# Patient Record
Sex: Female | Born: 1985
Health system: Southern US, Community
[De-identification: ages and names within clinical notes are randomized; demographics above are authoritative.]

## PROBLEM LIST (undated history)

## (undated) ENCOUNTER — Inpatient Hospital Stay (HOSPITAL_COMMUNITY): Payer: Self-pay

## (undated) DIAGNOSIS — R87610 Atypical squamous cells of undetermined significance on cytologic smear of cervix (ASC-US): Secondary | ICD-10-CM

## (undated) DIAGNOSIS — A64 Unspecified sexually transmitted disease: Secondary | ICD-10-CM

## (undated) DIAGNOSIS — E229 Hyperfunction of pituitary gland, unspecified: Secondary | ICD-10-CM

## (undated) DIAGNOSIS — D249 Benign neoplasm of unspecified breast: Secondary | ICD-10-CM

## (undated) DIAGNOSIS — B019 Varicella without complication: Secondary | ICD-10-CM

## (undated) DIAGNOSIS — IMO0002 Reserved for concepts with insufficient information to code with codable children: Secondary | ICD-10-CM

## (undated) DIAGNOSIS — K589 Irritable bowel syndrome without diarrhea: Secondary | ICD-10-CM

## (undated) DIAGNOSIS — R7989 Other specified abnormal findings of blood chemistry: Secondary | ICD-10-CM

## (undated) DIAGNOSIS — Z22322 Carrier or suspected carrier of Methicillin resistant Staphylococcus aureus: Secondary | ICD-10-CM

## (undated) HISTORY — DX: Atypical squamous cells of undetermined significance on cytologic smear of cervix (ASC-US): R87.610

## (undated) HISTORY — PX: INDUCED ABORTION: SHX677

## (undated) HISTORY — PX: ADENOIDECTOMY: SHX5191

## (undated) HISTORY — DX: Unspecified sexually transmitted disease: A64

## (undated) HISTORY — DX: Benign neoplasm of unspecified breast: D24.9

## (undated) HISTORY — DX: Carrier or suspected carrier of methicillin resistant Staphylococcus aureus: Z22.322

## (undated) HISTORY — PX: AUGMENTATION MAMMAPLASTY: SUR837

## (undated) HISTORY — PX: DILATION AND CURETTAGE OF UTERUS: SHX78

## (undated) HISTORY — DX: Hyperfunction of pituitary gland, unspecified: E22.9

## (undated) HISTORY — DX: Other specified abnormal findings of blood chemistry: R79.89

## (undated) HISTORY — DX: Reserved for concepts with insufficient information to code with codable children: IMO0002

## (undated) HISTORY — DX: Irritable bowel syndrome, unspecified: K58.9

## (undated) HISTORY — DX: Varicella without complication: B01.9

## (undated) HISTORY — PX: COLPOSCOPY: SHX161

---
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
## 2007-05-06 ENCOUNTER — Emergency Department (HOSPITAL_COMMUNITY): Admission: EM | Admit: 2007-05-06 | Discharge: 2007-05-06 | Payer: Self-pay | Admitting: Family Medicine

## 2007-05-20 ENCOUNTER — Emergency Department (HOSPITAL_COMMUNITY): Admission: EM | Admit: 2007-05-20 | Discharge: 2007-05-20 | Payer: Self-pay | Admitting: Emergency Medicine

## 2007-05-30 ENCOUNTER — Other Ambulatory Visit: Admission: RE | Admit: 2007-05-30 | Discharge: 2007-05-30 | Payer: Self-pay | Admitting: Obstetrics and Gynecology

## 2007-07-03 ENCOUNTER — Inpatient Hospital Stay (HOSPITAL_COMMUNITY): Admission: AD | Admit: 2007-07-03 | Discharge: 2007-07-03 | Payer: Self-pay | Admitting: Obstetrics & Gynecology

## 2008-03-27 ENCOUNTER — Emergency Department (HOSPITAL_COMMUNITY): Admission: EM | Admit: 2008-03-27 | Discharge: 2008-03-27 | Payer: Self-pay | Admitting: Family Medicine

## 2008-03-31 ENCOUNTER — Ambulatory Visit: Payer: Self-pay | Admitting: Family Medicine

## 2008-04-03 ENCOUNTER — Ambulatory Visit: Payer: Self-pay | Admitting: Family Medicine

## 2008-04-07 ENCOUNTER — Ambulatory Visit: Payer: Self-pay | Admitting: Family Medicine

## 2008-04-08 ENCOUNTER — Encounter (INDEPENDENT_AMBULATORY_CARE_PROVIDER_SITE_OTHER): Payer: Self-pay | Admitting: Internal Medicine

## 2008-04-08 ENCOUNTER — Telehealth (INDEPENDENT_AMBULATORY_CARE_PROVIDER_SITE_OTHER): Payer: Self-pay | Admitting: Internal Medicine

## 2008-04-10 ENCOUNTER — Telehealth (INDEPENDENT_AMBULATORY_CARE_PROVIDER_SITE_OTHER): Payer: Self-pay | Admitting: *Deleted

## 2008-04-23 ENCOUNTER — Encounter (INDEPENDENT_AMBULATORY_CARE_PROVIDER_SITE_OTHER): Payer: Self-pay | Admitting: Internal Medicine

## 2008-05-22 ENCOUNTER — Emergency Department (HOSPITAL_COMMUNITY): Admission: EM | Admit: 2008-05-22 | Discharge: 2008-05-22 | Payer: Self-pay | Admitting: Family Medicine

## 2008-10-08 ENCOUNTER — Emergency Department (HOSPITAL_COMMUNITY): Admission: EM | Admit: 2008-10-08 | Discharge: 2008-10-08 | Payer: Self-pay | Admitting: Family Medicine

## 2008-10-14 ENCOUNTER — Ambulatory Visit: Payer: Self-pay | Admitting: Family Medicine

## 2008-10-14 DIAGNOSIS — R1084 Generalized abdominal pain: Secondary | ICD-10-CM | POA: Insufficient documentation

## 2008-10-14 LAB — CONVERTED CEMR LAB
Bacteria, UA: 0
Epithelial cells, urine: 0 /lpf
Urobilinogen, UA: 0.2
WBC Urine, dipstick: NEGATIVE

## 2008-11-27 ENCOUNTER — Encounter (INDEPENDENT_AMBULATORY_CARE_PROVIDER_SITE_OTHER): Payer: Self-pay | Admitting: Internal Medicine

## 2008-11-27 ENCOUNTER — Other Ambulatory Visit: Admission: RE | Admit: 2008-11-27 | Discharge: 2008-11-27 | Payer: Self-pay | Admitting: Family Medicine

## 2008-11-27 ENCOUNTER — Ambulatory Visit: Payer: Self-pay | Admitting: Family Medicine

## 2008-11-30 LAB — CONVERTED CEMR LAB
AST: 16 units/L (ref 0–37)
Albumin: 4 g/dL (ref 3.5–5.2)
BUN: 15 mg/dL (ref 6–23)
Basophils Absolute: 0 10*3/uL (ref 0.0–0.1)
CO2: 30 meq/L (ref 19–32)
Calcium: 8.7 mg/dL (ref 8.4–10.5)
Cholesterol: 156 mg/dL (ref 0–200)
Eosinophils Absolute: 0.1 10*3/uL (ref 0.0–0.7)
GFR calc non Af Amer: 116.05 mL/min (ref >60)
Glucose, Bld: 86 mg/dL (ref 70–99)
HCT: 40 % (ref 36.0–46.0)
HDL: 47.1 mg/dL (ref >39.00)
Lymphs Abs: 2.4 10*3/uL (ref 0.7–4.0)
MCHC: 33.8 g/dL (ref 30.0–36.0)
Monocytes Relative: 6.2 % (ref 3.0–12.0)
Neutro Abs: 3.8 10*3/uL (ref 1.4–7.7)
Platelets: 319 10*3/uL (ref 150.0–400.0)
Potassium: 4 meq/L (ref 3.5–5.1)
RDW: 13.4 % (ref 11.5–14.6)
TSH: 0.5 microintl units/mL (ref 0.35–5.50)
Total Bilirubin: 0.5 mg/dL (ref 0.3–1.2)
VLDL: 7.6 mg/dL (ref 0.0–40.0)

## 2008-12-02 ENCOUNTER — Encounter: Payer: Self-pay | Admitting: Family Medicine

## 2008-12-03 ENCOUNTER — Telehealth (INDEPENDENT_AMBULATORY_CARE_PROVIDER_SITE_OTHER): Payer: Self-pay | Admitting: Internal Medicine

## 2009-01-13 ENCOUNTER — Other Ambulatory Visit: Admission: RE | Admit: 2009-01-13 | Discharge: 2009-01-13 | Payer: Self-pay | Admitting: Gynecology

## 2009-01-13 ENCOUNTER — Ambulatory Visit: Payer: Self-pay | Admitting: Gynecology

## 2009-01-13 ENCOUNTER — Encounter: Payer: Self-pay | Admitting: Gynecology

## 2009-01-18 ENCOUNTER — Ambulatory Visit: Payer: Self-pay | Admitting: Gynecology

## 2009-01-19 ENCOUNTER — Encounter: Admission: RE | Admit: 2009-01-19 | Discharge: 2009-01-19 | Payer: Self-pay | Admitting: Gynecology

## 2009-01-25 ENCOUNTER — Ambulatory Visit: Payer: Self-pay | Admitting: Gynecology

## 2009-04-13 ENCOUNTER — Ambulatory Visit: Payer: Self-pay | Admitting: Family Medicine

## 2009-04-13 ENCOUNTER — Encounter (INDEPENDENT_AMBULATORY_CARE_PROVIDER_SITE_OTHER): Payer: Self-pay | Admitting: Internal Medicine

## 2009-04-15 ENCOUNTER — Ambulatory Visit: Payer: Self-pay | Admitting: Family Medicine

## 2009-04-15 ENCOUNTER — Encounter (INDEPENDENT_AMBULATORY_CARE_PROVIDER_SITE_OTHER): Payer: Self-pay | Admitting: Internal Medicine

## 2009-04-15 LAB — CONVERTED CEMR LAB
Chlamydia, DNA Probe: POSITIVE — AB
GC Probe Amp, Genital: NEGATIVE

## 2009-06-30 ENCOUNTER — Other Ambulatory Visit: Admission: RE | Admit: 2009-06-30 | Discharge: 2009-06-30 | Payer: Self-pay | Admitting: Gynecology

## 2009-06-30 ENCOUNTER — Ambulatory Visit: Payer: Self-pay | Admitting: Gynecology

## 2009-07-23 ENCOUNTER — Emergency Department (HOSPITAL_COMMUNITY): Admission: EM | Admit: 2009-07-23 | Discharge: 2009-07-23 | Payer: Self-pay | Admitting: Family Medicine

## 2009-10-06 ENCOUNTER — Encounter: Admission: RE | Admit: 2009-10-06 | Discharge: 2009-10-06 | Payer: Self-pay | Admitting: Gynecology

## 2009-11-10 ENCOUNTER — Ambulatory Visit: Payer: Self-pay | Admitting: Gynecology

## 2009-11-15 ENCOUNTER — Ambulatory Visit: Payer: Self-pay | Admitting: Gynecology

## 2009-12-31 ENCOUNTER — Ambulatory Visit: Payer: Self-pay | Admitting: Gynecology

## 2010-02-02 ENCOUNTER — Other Ambulatory Visit: Admission: RE | Admit: 2010-02-02 | Discharge: 2010-02-02 | Payer: Self-pay | Admitting: Gynecology

## 2010-02-02 ENCOUNTER — Ambulatory Visit: Payer: Self-pay | Admitting: Gynecology

## 2010-02-24 ENCOUNTER — Emergency Department (HOSPITAL_COMMUNITY): Admission: EM | Admit: 2010-02-24 | Discharge: 2010-02-24 | Payer: Self-pay | Admitting: Family Medicine

## 2010-04-27 ENCOUNTER — Ambulatory Visit: Admit: 2010-04-27 | Payer: Self-pay | Admitting: Gynecology

## 2010-05-15 ENCOUNTER — Encounter: Payer: Self-pay | Admitting: Gynecology

## 2010-05-15 ENCOUNTER — Encounter: Payer: Self-pay | Admitting: Obstetrics & Gynecology

## 2010-05-20 ENCOUNTER — Ambulatory Visit
Admission: RE | Admit: 2010-05-20 | Discharge: 2010-05-20 | Payer: Self-pay | Source: Home / Self Care | Attending: Gynecology | Admitting: Gynecology

## 2010-05-22 ENCOUNTER — Emergency Department (HOSPITAL_COMMUNITY)
Admission: EM | Admit: 2010-05-22 | Discharge: 2010-05-22 | Payer: Self-pay | Source: Home / Self Care | Admitting: Emergency Medicine

## 2010-05-22 LAB — POCT PREGNANCY, URINE

## 2010-05-24 ENCOUNTER — Ambulatory Visit
Admission: RE | Admit: 2010-05-24 | Discharge: 2010-05-24 | Payer: Self-pay | Source: Home / Self Care | Attending: Gynecology | Admitting: Gynecology

## 2010-05-31 ENCOUNTER — Telehealth: Payer: Self-pay | Admitting: Family Medicine

## 2010-05-31 ENCOUNTER — Inpatient Hospital Stay (HOSPITAL_COMMUNITY)
Admission: AD | Admit: 2010-05-31 | Discharge: 2010-06-01 | Disposition: A | Discharge status: Home / Self Care | Payer: 59 | Source: Ambulatory Visit | Attending: Obstetrics and Gynecology | Admitting: Obstetrics and Gynecology

## 2010-05-31 DIAGNOSIS — E860 Dehydration: Secondary | ICD-10-CM | POA: Insufficient documentation

## 2010-05-31 DIAGNOSIS — N390 Urinary tract infection, site not specified: Secondary | ICD-10-CM

## 2010-05-31 DIAGNOSIS — O211 Hyperemesis gravidarum with metabolic disturbance: Secondary | ICD-10-CM

## 2010-05-31 DIAGNOSIS — O2390 Unspecified genitourinary tract infection in pregnancy, unspecified trimester: Secondary | ICD-10-CM | POA: Insufficient documentation

## 2010-05-31 LAB — URINALYSIS, ROUTINE W REFLEX MICROSCOPIC
Bilirubin Urine: NEGATIVE
Hgb urine dipstick: NEGATIVE
Ketones, ur: 40 mg/dL — AB
Nitrite: NEGATIVE
Protein, ur: NEGATIVE mg/dL
Specific Gravity, Urine: >1.03 — ABNORMAL HIGH (ref 1.005–1.030)
Urobilinogen, UA: 1 mg/dL (ref 0.0–1.0)

## 2010-05-31 LAB — URINE MICROSCOPIC-ADD ON

## 2010-05-31 LAB — CBC
HCT: 37.1 % (ref 36.0–46.0)
Hemoglobin: 12.2 g/dL (ref 12.0–15.0)
MCH: 27.5 pg (ref 26.0–34.0)
MCV: 83.7 fL (ref 78.0–100.0)
Platelets: 260 10*3/uL (ref 150–400)
RBC: 4.43 MIL/uL (ref 3.87–5.11)
WBC: 8.4 10*3/uL (ref 4.0–10.5)

## 2010-05-31 LAB — COMPREHENSIVE METABOLIC PANEL
ALT: 10 U/L (ref 0–35)
AST: 14 U/L (ref 0–37)
CO2: 26 mEq/L (ref 19–32)
Calcium: 9.2 mg/dL (ref 8.4–10.5)
Chloride: 101 mEq/L (ref 96–112)
Creatinine, Ser: 0.56 mg/dL (ref 0.4–1.2)
GFR calc non Af Amer: >60 mL/min (ref >60)
Glucose, Bld: 79 mg/dL (ref 70–99)
Total Bilirubin: 0.7 mg/dL (ref 0.3–1.2)

## 2010-06-01 ENCOUNTER — Other Ambulatory Visit: Payer: Self-pay | Admitting: Family Medicine

## 2010-06-01 ENCOUNTER — Ambulatory Visit: Payer: 59 | Admitting: Gynecology

## 2010-06-01 DIAGNOSIS — O3680X0 Pregnancy with inconclusive fetal viability, not applicable or unspecified: Secondary | ICD-10-CM

## 2010-06-01 DIAGNOSIS — Z3480 Encounter for supervision of other normal pregnancy, unspecified trimester: Secondary | ICD-10-CM

## 2010-06-02 ENCOUNTER — Encounter (INDEPENDENT_AMBULATORY_CARE_PROVIDER_SITE_OTHER): Payer: Self-pay | Admitting: *Deleted

## 2010-06-02 DIAGNOSIS — Z3480 Encounter for supervision of other normal pregnancy, unspecified trimester: Secondary | ICD-10-CM

## 2010-06-02 DIAGNOSIS — O219 Vomiting of pregnancy, unspecified: Secondary | ICD-10-CM

## 2010-06-02 LAB — CONVERTED CEMR LAB
Antibody Screen: NEGATIVE
Eosinophils Absolute: 0.1 10*3/uL (ref 0.0–0.7)
Eosinophils Relative: 1 % (ref 0–5)
HCT: 41 % (ref 36.0–46.0)
HIV: NONREACTIVE
Hemoglobin: 12.5 g/dL (ref 12.0–15.0)
Herpes Simplex Vrs I&II-IgM Ab (EIA): 1.03
Hgb S Quant: 0 % (ref 0.0–0.0)
Lymphocytes Relative: 29 % (ref 12–46)
Lymphs Abs: 2.1 10*3/uL (ref 0.7–4.0)
MCV: 90.9 fL (ref 78.0–100.0)
Monocytes Absolute: 0.6 10*3/uL (ref 0.1–1.0)
Monocytes Relative: 8 % (ref 3–12)
Rh Type: POSITIVE
WBC: 7.4 10*3/uL (ref 4.0–10.5)

## 2010-06-02 LAB — URINE CULTURE

## 2010-06-06 ENCOUNTER — Ambulatory Visit (HOSPITAL_COMMUNITY): Payer: 59

## 2010-06-07 ENCOUNTER — Encounter (HOSPITAL_COMMUNITY): Payer: Self-pay

## 2010-06-07 ENCOUNTER — Ambulatory Visit (HOSPITAL_COMMUNITY)
Admission: RE | Admit: 2010-06-07 | Discharge: 2010-06-07 | Disposition: A | Discharge status: Home / Self Care | Payer: 59 | Source: Ambulatory Visit | Attending: Family Medicine | Admitting: Family Medicine

## 2010-06-07 ENCOUNTER — Other Ambulatory Visit: Payer: Self-pay | Admitting: Family Medicine

## 2010-06-07 DIAGNOSIS — O3680X0 Pregnancy with inconclusive fetal viability, not applicable or unspecified: Secondary | ICD-10-CM

## 2010-06-07 DIAGNOSIS — O368390 Maternal care for abnormalities of the fetal heart rate or rhythm, unspecified trimester, not applicable or unspecified: Secondary | ICD-10-CM | POA: Insufficient documentation

## 2010-06-07 DIAGNOSIS — Z3689 Encounter for other specified antenatal screening: Secondary | ICD-10-CM | POA: Insufficient documentation

## 2010-06-08 ENCOUNTER — Ambulatory Visit (INDEPENDENT_AMBULATORY_CARE_PROVIDER_SITE_OTHER): Payer: 59 | Admitting: Gynecology

## 2010-06-08 ENCOUNTER — Other Ambulatory Visit: Payer: Self-pay | Admitting: Obstetrics and Gynecology

## 2010-06-08 ENCOUNTER — Ambulatory Visit (HOSPITAL_COMMUNITY)
Admission: RE | Admit: 2010-06-08 | Discharge: 2010-06-08 | Disposition: A | Discharge status: Home / Self Care | Payer: 59 | Source: Ambulatory Visit | Attending: Obstetrics & Gynecology | Admitting: Obstetrics & Gynecology

## 2010-06-08 ENCOUNTER — Ambulatory Visit: Payer: 59 | Admitting: Obstetrics & Gynecology

## 2010-06-08 ENCOUNTER — Other Ambulatory Visit: Payer: 59

## 2010-06-08 DIAGNOSIS — O021 Missed abortion: Secondary | ICD-10-CM

## 2010-06-08 LAB — CBC
HCT: 38.8 % (ref 36.0–46.0)
Hemoglobin: 12.4 g/dL (ref 12.0–15.0)
MCV: 84.5 fL (ref 78.0–100.0)
WBC: 8.6 10*3/uL (ref 4.0–10.5)

## 2010-06-08 LAB — SURGICAL PCR SCREEN

## 2010-06-09 NOTE — Progress Notes (Signed)
Summary: morning sickness  Phone Note Call from Patient Call back at Home Phone [phone]   Caller: Patient Call For: Dr. Milinda Antis Summary of Call: Patient says that she is [redacted] weeks pregnant and she is having alot of morning sickness. She says  that she does not have a gynecologist yet and she is really concerned for her health and her baby because she has lost 18lbs in a months time because she can't keep down any food, can't take her prenatal vit because they make her really sick as well. She is asking for any suggestions.  Initial call taken by: Melody Comas,  May 31, 2010 1:50 PM  Follow-up for Phone Call        we need to get her ref to obgyn asap - I will do referral  she will need to tell Shirlee Limerick her LMP the weight loss is no doubtedly frightening her  eating small amounts in frequent intervals is recommended  sips of fluids to stay hydrated  the book - WHAT TO EXPECT WHEN YOU ARE EXPECTING is also helpful  if weak or dizzy go to ER for fluids   Follow-up by: Judith Part MD,  May 31, 2010 4:48 PM  Additional Follow-up for Phone Call Additional follow up Details #1::        Left message for patient to call back. Lewanda Rife LPN  May 31, 2010 5:21 PM   New Problems: HYPEREMESIS GRAVIDARUM (ICD-643.10)   Additional Follow-up for Phone Call Additional follow up Details #2::    Appt made with [facility] today to see Tina-R.N. to get est for OB care. Follow-up by: Carlton Adam,  June 01, 2010 11:05 AM  New Problems: HYPEREMESIS GRAVIDARUM (ICD-643.10)

## 2010-06-14 ENCOUNTER — Encounter: Payer: 59 | Admitting: Obstetrics and Gynecology

## 2010-06-16 ENCOUNTER — Inpatient Hospital Stay (INDEPENDENT_AMBULATORY_CARE_PROVIDER_SITE_OTHER)
Admission: RE | Admit: 2010-06-16 | Discharge: 2010-06-16 | Disposition: A | Discharge status: Home / Self Care | Payer: No Typology Code available for payment source | Source: Ambulatory Visit | Attending: Emergency Medicine | Admitting: Emergency Medicine

## 2010-06-16 DIAGNOSIS — S139XXA Sprain of joints and ligaments of unspecified parts of neck, initial encounter: Secondary | ICD-10-CM

## 2010-06-16 DIAGNOSIS — T148XXA Other injury of unspecified body region, initial encounter: Secondary | ICD-10-CM

## 2010-06-20 ENCOUNTER — Encounter: Payer: 59 | Admitting: Obstetrics & Gynecology

## 2010-06-26 NOTE — Op Note (Signed)
  [name], [name]           ACCOUNT NO.:  [id number]  MEDICAL RECORD NO.:  [id number]           PATIENT TYPE:  O  LOCATION:  [facility]                          [facility]  PHYSICIAN:  Catalina Antigua, MD     DATE OF BIRTH:  [birth year]  DATE OF PROCEDURE:  06/08/2010 DATE OF DISCHARGE:  06/08/2010                              OPERATIVE REPORT   PREOPERATIVE DIAGNOSIS:  This is a 25 year old G4, P 1-0-2-1 with an 8- week missed abortion.  POSTOPERATIVE DIAGNOSIS:  This is a 25 year old G4, P 1-0-2-1 with an 8- week missed abortion.  PROCEDURE:  Dilatation and evacuation.  SURGEON:  Catalina Antigua, MD  ASSISTANT:  None.  ANESTHESIA:  MAC.  IV FLUIDS:  500 mL.  ESTIMATED BLOOD LOSS:  Minimal.  COMPLICATIONS:  None.  FINDINGS:  Approximately 10 weeks size uterus, no palpable adnexal masses.  Uterine cavity sounded to 10 cm.  SPECIMENS COLLECTED:  Products of conception and sent to pathology.  After informed consent was obtained, the patient was taken to the operating room where anesthesia was induced and found to be adequate. The patient was placed in dorsal lithotomy position and prepped and draped in the usual sterile fashion.  A Graves speculum was placed in the vagina, anterior lip of the cervix was grasped with a single-tooth tenaculum.  The cervix was injected in its circumference with a 10 mL of 1% Nesacaine solution.  The uterine cavity was sounded to 10 cm.  The cervix was then dilated to 10 mm through serial insertion of Hegar dilators.  A 9-mm curved curette was introduced into the uterine cavity and suction was applied.  A size 1 Sims curette was then introduced into the uterine cavity and gentle curettage was performed until a gritty texture was obtained.  The 9-mm curved curette was then introduced as to aspirate any loosened debris. All instruments were removed from the patient's vagina.  Hemostasis was visualized at tenaculum site.  The patient  tolerated the procedure well. Sponge, lap, and needle count were correct x2.     Catalina Antigua, MD     PC/MEDQ  D:  06/08/2010  T:  06/09/2010  Job:  [id number]  Electronically Signed by Catalina Antigua  on 06/26/2010 09:59:52 AM

## 2010-06-27 ENCOUNTER — Ambulatory Visit: Payer: Self-pay | Admitting: Obstetrics & Gynecology

## 2010-07-04 ENCOUNTER — Ambulatory Visit: Payer: Self-pay | Admitting: Obstetrics & Gynecology

## 2010-07-05 LAB — POCT URINALYSIS DIPSTICK
Protein, ur: NEGATIVE mg/dL
Specific Gravity, Urine: 1.025 (ref 1.005–1.030)
pH: 6 (ref 5.0–8.0)

## 2010-07-05 LAB — POCT PREGNANCY, URINE

## 2010-07-12 ENCOUNTER — Ambulatory Visit: Payer: Self-pay | Admitting: Obstetrics and Gynecology

## 2010-07-13 ENCOUNTER — Encounter: Payer: No Typology Code available for payment source | Admitting: Obstetrics & Gynecology

## 2010-07-13 DIAGNOSIS — Z09 Encounter for follow-up examination after completed treatment for conditions other than malignant neoplasm: Secondary | ICD-10-CM

## 2010-07-14 ENCOUNTER — Encounter: Payer: Self-pay | Admitting: Family Medicine

## 2010-07-15 ENCOUNTER — Encounter: Payer: Self-pay | Admitting: Family Medicine

## 2010-07-15 ENCOUNTER — Ambulatory Visit (INDEPENDENT_AMBULATORY_CARE_PROVIDER_SITE_OTHER): Payer: 59 | Admitting: Family Medicine

## 2010-07-15 VITALS — BP 102/70 | HR 72 | Temp 98.6°F | Wt 138.0 lb

## 2010-07-15 DIAGNOSIS — S39012A Strain of muscle, fascia and tendon of lower back, initial encounter: Secondary | ICD-10-CM

## 2010-07-15 DIAGNOSIS — S335XXA Sprain of ligaments of lumbar spine, initial encounter: Secondary | ICD-10-CM

## 2010-07-15 MED ORDER — CYCLOBENZAPRINE HCL 10 MG PO TABS: 10.0000 mg | ORAL_TABLET | Freq: Two times a day (BID) | ORAL | Status: DC | PRN
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
## 2010-07-15 NOTE — Assessment & Plan Note (Signed)
Supportive care for now.  Red flags to return discussed. Discussed scheduled NSAID, MR for next week.  Stretching/strengthening exercises provided from [facility] patient advisor on lower back pain. If not improving, consider PT referral. rec massage for tense muscles.

## 2010-07-26 NOTE — Assessment & Plan Note (Signed)
[name], DEROCHER NO.:  [id number]  MEDICAL RECORD NO.:  [id number]           PATIENT TYPE:  LOCATION:  [facility]           FACILITY:  PHYSICIAN:  Tinnie Gens, MD        DATE OF BIRTH:  1985/09/03  DATE OF SERVICE:  07/13/2010                                 CLINIC NOTE  CHIEF COMPLAINT:  Post-partum  check.  HISTORY OF PRESENT ILLNESS:  The patient is a 25 year old gravida 4, para 1-0-3-0 who is status post missed AB at 8 weeks.  Approximately a month ago, she underwent a D and C for an 8-week loss.  She has been doing well.  Her LMP was June 15, 2010.  She has been having unprotected intercourse and is waiting for her cycle presently.  The patient has a history of abnormal Pap.  She had a colpo done by Dr. Audie Box within the last year and possibly is to follow with a 63-month Pap but has not had that done yet.  Otherwise, she reports her mood is improving and better since her miscarriage.  PHYSICAL EXAMINATION TODAY:  VITAL SIGNS:  As noted in the chart. GENERAL:  She is a well-developed, well-nourished female, in no acute distress. ABDOMEN:  Soft, nontender, nondistended. GENITOURINARY:  Normal external female genitalia.  BUS normal.  Vagina is pink and rugated.  Cervix is parous without lesion.  Uterus is small, anteverted, and without adnexal mass or tenderness.  IMPRESSION:  Status post spontaneous abortion, doing well.  PLAN: 1. Methods of birth control are discussed as well as waiting approximately 3     months to achieve pregnancy. 2. I will check her records and determine when she needs her next Pap     smear scheduled and bring her back in for this.          ______________________________ Tinnie Gens, MD    TP/MEDQ  D:  07/13/2010  T:  07/14/2010  Job:  [id number]

## 2010-08-01 LAB — POCT URINALYSIS DIP (DEVICE)
Bilirubin Urine: NEGATIVE
Ketones, ur: NEGATIVE mg/dL
Specific Gravity, Urine: 1.02 (ref 1.005–1.030)
pH: 7 (ref 5.0–8.0)

## 2010-08-08 LAB — CULTURE, ROUTINE-ABSCESS

## 2010-09-09 NOTE — Consult Note (Signed)
[name], [name]           ACCOUNT NO.:  [id number]   MEDICAL RECORD NO.:  [id number]          PATIENT TYPE:  MAT   LOCATION:  [facility]                          [facility]ILITY:  [facility]   PHYSICIAN:  Lenoard Aden, M.D.DATE OF BIRTH:  [birth year]   DATE OF CONSULTATION:  07/29/2005  DATE OF DISCHARGE:                                   CONSULTATION   CHIEF COMPLAINT:  Labor.   HISTORY OF PRESENT ILLNESS:  She is an 25 year old African-American female,  G1, P0, who presents at [redacted] weeks gestation with rule out labor.   PAST OBSTETRIC HISTORY:  Noncontributory.   ALLERGIES:  BANANAS and WALNUTS.   MEDICATIONS:  Prenatal vitamins.   FAMILY HISTORY:  Breast cancer and seizure disorder.   SOCIAL HISTORY:  She is a nonsmoker, nondrinker, denies domestic or physical  violence.   PREGNANCY COURSE:  Complicated.   PHYSICAL EXAMINATION:  GENERAL: Well-developed, well-nourished African-  American female in no acute distress.  HEENT: Normal.  LUNGS: Clear.  HEART: Regular rate and rhythm.  ABDOMEN: Soft, gravid, nontender.  PELVIC:  Cervix closed, 20%, vertex, -3 per RN.  EXTREMITIES:  Reveal no cords.  NEUROLOGIC:  Exam nonfocal.   IMPRESSION:  1.  A 38-week intrauterine pregnancy.  2.  Prodromal labor.   PLAN:  1.  Monitor x1 hours.  2.  Recheck.  3.  Discharge home if no cervical change noted.  4.  Ambien to be given upon discharge.      Lenoard Aden, M.D.  Electronically Signed     RJT/MEDQ  D:  07/29/2005  T:  07/29/2005  Job:  [id number]

## 2010-09-09 NOTE — Consult Note (Signed)
[name], [name]           ACCOUNT NO.:  [id number]   MEDICAL RECORD NO.:  [id number]          PATIENT TYPE:  MAT   LOCATION:  [facility]                          [facility]   PHYSICIAN:  Lenoard Aden, M.D.DATE OF BIRTH:  [birth year]   DATE OF CONSULTATION:  08/07/2005  DATE OF DISCHARGE:                                   CONSULTATION   CHIEF COMPLAINT:  Rule out labor.   She is a 25 year old African-American female G1, P0 at 37 and two-sevenths  weeks with contractions today.  She denies bleeding and leakage of fluid.  She has noncontributory obstetric history.  Allergies to BANANAS and  WALNUTS.  She is a nonsmoker, nondrinker, and denies domestic and physical  violence.   MEDICATIONS:  Include prenatal vitamins.   FAMILY HISTORY:  Breast cancer and seizure disorder.   PERTINENT LABORATORY DATA:  Blood type O positive, Rh antibody negative.  Rubella immune.  Hepatitis and HIV nonreactive.   Pregnancy course to date reportedly uncomplicated.   PHYSICAL EXAMINATION:  GENERAL:  She is a well-developed, well-nourished  African-American female in no acute distress.  HEENT:  Normal.  LUNGS:  Clear.  HEART:  Regular rate and rhythm.  ABDOMEN:  Soft, gravid, nontender.  PELVIC:  Cervix is 1 cm, 50% effaced, vertex at a -2.  EXTREMITIES:  Revealed no cords.  NEUROLOGIC:  Nonfocal.   NST is reactive.  The patient ambulated for 1 hour and cervical exam is  unchanged.   IMPRESSION:  Prodromal labor pattern, no evidence of cervical change.   PLAN:  Discharge home.  Labor warnings given, Ambien given.  Follow up in  the office within 2 days.      Lenoard Aden, M.D.  Electronically Signed     RJT/MEDQ  D:  08/07/2005  T:  08/07/2005  Job:  [id number]

## 2010-09-22 ENCOUNTER — Other Ambulatory Visit (HOSPITAL_COMMUNITY)
Admission: RE | Admit: 2010-09-22 | Discharge: 2010-09-22 | Disposition: A | Discharge status: Home / Self Care | Payer: 59 | Source: Ambulatory Visit | Attending: Gynecology | Admitting: Gynecology

## 2010-09-22 ENCOUNTER — Ambulatory Visit (INDEPENDENT_AMBULATORY_CARE_PROVIDER_SITE_OTHER): Payer: 59 | Admitting: Gynecology

## 2010-09-22 ENCOUNTER — Other Ambulatory Visit: Payer: Self-pay | Admitting: Gynecology

## 2010-09-22 DIAGNOSIS — N898 Other specified noninflammatory disorders of vagina: Secondary | ICD-10-CM

## 2010-09-22 DIAGNOSIS — N949 Unspecified condition associated with female genital organs and menstrual cycle: Secondary | ICD-10-CM

## 2010-09-22 DIAGNOSIS — B373 Candidiasis of vulva and vagina: Secondary | ICD-10-CM

## 2010-09-22 DIAGNOSIS — Z113 Encounter for screening for infections with a predominantly sexual mode of transmission: Secondary | ICD-10-CM

## 2010-09-22 DIAGNOSIS — R87619 Unspecified abnormal cytological findings in specimens from cervix uteri: Secondary | ICD-10-CM | POA: Insufficient documentation

## 2010-09-22 DIAGNOSIS — N926 Irregular menstruation, unspecified: Secondary | ICD-10-CM

## 2010-09-27 ENCOUNTER — Other Ambulatory Visit (INDEPENDENT_AMBULATORY_CARE_PROVIDER_SITE_OTHER): Payer: 59

## 2010-09-27 ENCOUNTER — Ambulatory Visit: Payer: 59 | Admitting: Gynecology

## 2010-09-27 DIAGNOSIS — N949 Unspecified condition associated with female genital organs and menstrual cycle: Secondary | ICD-10-CM

## 2010-09-27 DIAGNOSIS — N831 Corpus luteum cyst of ovary, unspecified side: Secondary | ICD-10-CM

## 2010-12-28 DIAGNOSIS — D249 Benign neoplasm of unspecified breast: Secondary | ICD-10-CM | POA: Insufficient documentation

## 2010-12-29 ENCOUNTER — Encounter: Payer: Self-pay | Admitting: Gynecology

## 2010-12-29 ENCOUNTER — Ambulatory Visit (INDEPENDENT_AMBULATORY_CARE_PROVIDER_SITE_OTHER): Payer: 59 | Admitting: Gynecology

## 2010-12-29 DIAGNOSIS — Z113 Encounter for screening for infections with a predominantly sexual mode of transmission: Secondary | ICD-10-CM

## 2010-12-29 DIAGNOSIS — N898 Other specified noninflammatory disorders of vagina: Secondary | ICD-10-CM

## 2010-12-29 DIAGNOSIS — B9689 Other specified bacterial agents as the cause of diseases classified elsewhere: Secondary | ICD-10-CM

## 2010-12-29 DIAGNOSIS — A499 Bacterial infection, unspecified: Secondary | ICD-10-CM

## 2010-12-29 DIAGNOSIS — IMO0002 Reserved for concepts with insufficient information to code with codable children: Secondary | ICD-10-CM

## 2010-12-29 DIAGNOSIS — N760 Acute vaginitis: Secondary | ICD-10-CM

## 2010-12-29 DIAGNOSIS — R809 Proteinuria, unspecified: Secondary | ICD-10-CM

## 2010-12-29 MED ORDER — METRONIDAZOLE 500 MG PO TABS: 500.0000 mg | ORAL_TABLET | Freq: Two times a day (BID) | ORAL | Status: AC

## 2010-12-29 NOTE — Progress Notes (Signed)
Patient presents with several issues. She notes pain with intercourse over the last several episodes. She has just resumed sexual activity and notes that the last 3 or 4 times such as had some deep dyspareunia as if something is being head centrally. Also wants to be screened for STDs. She has no specific exposure but wants to be screened for "everything".  Exam Abdomen: Soft nontender without masses guarding rebound organomegaly Pelvic: External BUS vagina with thick white discharge KOH wet prep done, cervix normal GC Chlamydia screen done, uterus normal size midline mobile nontender adnexa without masses or tenderness  Assessment and plan: #1 White discharge. KOH wet prep was positive for BV we'll treat with Flagyl 500 twice a day x7 days always discussed. #2 Dyspareunia. Exam is normal we'll treat her BP if her dyspareunia continues she knows to call me and we'll schedule ultrasound. If it resolves and we'll follow expectantly. #3 STD screening. I did GC chlamydia hepatitis B hepatitis C HIV and RPR. Patient will followup with these results. #4  History of low-grade SIL.  Patient has history of low-grade SIL. Had colposcopy and biopsy October 2010.  followup Paps showed ASCUS x2 in 2011.   May 2012 was normal. Patient is due for her annual in October or November and have asked her to schedule this and will do her six-month followup Pap smear done.

## 2010-12-30 LAB — HEPATITIS B SURFACE ANTIGEN: Hepatitis B Surface Ag: NEGATIVE

## 2010-12-30 LAB — HIV ANTIBODY (ROUTINE TESTING W REFLEX): HIV: NONREACTIVE

## 2011-01-12 LAB — DIFFERENTIAL
Basophils Absolute: 0.1
Basophils Relative: 1
Eosinophils Absolute: 0.1
Eosinophils Relative: 2
Lymphocytes Relative: 42
Lymphs Abs: 3.2
Monocytes Absolute: 0.5
Monocytes Relative: 7
Neutro Abs: 3.6
Neutrophils Relative %: 48

## 2011-01-12 LAB — CBC
HCT: 38.5
Hemoglobin: 12.7
MCHC: 33
MCV: 84.1
Platelets: 329
RBC: 4.58
RDW: 14.8
WBC: 7.5

## 2011-01-12 LAB — COMPREHENSIVE METABOLIC PANEL
AST: 16
Albumin: 3.9
BUN: 9
Calcium: 9.3
Creatinine, Ser: 0.8
GFR calc Af Amer: >60
GFR calc non Af Amer: >60
Total Bilirubin: 0.5

## 2011-01-12 LAB — COMPREHENSIVE METABOLIC PANEL WITH GFR
ALT: 11
Alkaline Phosphatase: 57
CO2: 29
Chloride: 101
Glucose, Bld: 92
Potassium: 4.3
Sodium: 137
Total Protein: 7

## 2011-01-12 LAB — POCT URINALYSIS DIP (DEVICE)
Bilirubin Urine: NEGATIVE
Glucose, UA: NEGATIVE
Hgb urine dipstick: NEGATIVE
Nitrite: NEGATIVE
Specific Gravity, Urine: >=1.03
Urobilinogen, UA: 1
pH: 6

## 2011-01-12 LAB — LIPASE, BLOOD: Lipase: 28

## 2011-01-12 LAB — URINALYSIS, ROUTINE W REFLEX MICROSCOPIC
Bilirubin Urine: NEGATIVE
Glucose, UA: NEGATIVE
Hgb urine dipstick: NEGATIVE
Ketones, ur: NEGATIVE
Protein, ur: NEGATIVE
Urobilinogen, UA: 1

## 2011-01-12 LAB — WET PREP, GENITAL
Trich, Wet Prep: NONE SEEN
Yeast Wet Prep HPF POC: NONE SEEN

## 2011-01-12 LAB — GC/CHLAMYDIA PROBE AMP, GENITAL
Chlamydia, DNA Probe: NEGATIVE
GC Probe Amp, Genital: NEGATIVE

## 2011-01-12 LAB — POCT PREGNANCY, URINE: Operator id: 151321

## 2011-01-16 LAB — WET PREP, GENITAL
Trich, Wet Prep: NONE SEEN
Yeast Wet Prep HPF POC: NONE SEEN

## 2011-01-16 LAB — URINALYSIS, ROUTINE W REFLEX MICROSCOPIC
Bilirubin Urine: NEGATIVE
Glucose, UA: NEGATIVE
Hgb urine dipstick: NEGATIVE
Ketones, ur: NEGATIVE
Protein, ur: NEGATIVE
pH: 5.5

## 2011-01-16 LAB — GC/CHLAMYDIA PROBE AMP, GENITAL
Chlamydia, DNA Probe: NEGATIVE
GC Probe Amp, Genital: NEGATIVE

## 2011-01-18 ENCOUNTER — Telehealth: Payer: Self-pay | Admitting: *Deleted

## 2011-01-18 DIAGNOSIS — B373 Candidiasis of vulva and vagina: Secondary | ICD-10-CM

## 2011-01-18 MED ORDER — FLUCONAZOLE 150 MG PO TABS: 150.0000 mg | ORAL_TABLET | Freq: Once | ORAL | Status: AC

## 2011-01-18 NOTE — Telephone Encounter (Signed)
Pt called stated at last OV 09/06 treated for BV now c/o yeast sx's itching and she states she seems to get this around her period. Requests Diflucan. Please Advise.

## 2011-01-18 NOTE — Telephone Encounter (Signed)
Diflucan 150 mg x1 office visit if persist or recur

## 2011-01-19 ENCOUNTER — Ambulatory Visit (INDEPENDENT_AMBULATORY_CARE_PROVIDER_SITE_OTHER): Payer: 59 | Admitting: Gynecology

## 2011-01-19 ENCOUNTER — Encounter: Payer: Self-pay | Admitting: Gynecology

## 2011-01-19 DIAGNOSIS — Z309 Encounter for contraceptive management, unspecified: Secondary | ICD-10-CM

## 2011-01-19 DIAGNOSIS — B373 Candidiasis of vulva and vagina: Secondary | ICD-10-CM

## 2011-01-19 DIAGNOSIS — N898 Other specified noninflammatory disorders of vagina: Secondary | ICD-10-CM

## 2011-01-19 DIAGNOSIS — L293 Anogenital pruritus, unspecified: Secondary | ICD-10-CM

## 2011-01-19 MED ORDER — TERCONAZOLE 0.8 % VA CREA: 1.0000 | TOPICAL_CREAM | Freq: Every day | VAGINAL | Status: AC

## 2011-01-19 MED ORDER — NORETHIN ACE-ETH ESTRAD-FE 1-20 MG-MCG PO TABS: 1.0000 | ORAL_TABLET | Freq: Every day | ORAL | Status: DC

## 2011-01-19 NOTE — Telephone Encounter (Signed)
Pt informed kw

## 2011-01-19 NOTE — Progress Notes (Signed)
Patient presents having called with vaginal discharge and itching we prescribed a Diflucan of the phone and her itching has continued. Also was talked about birth control. She has been using condoms wants to do something different. She had used Depo-Provera in the past times one shot did not like the side effects. She's not used anything else in the past.   Exam Pelvic external BUS vagina with thick white discharge KOH wet prep done, cervix normal, uterus normal size midline mobile nontender adnexa without masses or tenderness.  Assessment and plan: 1. White discharge. KOH wet prep positive for yeast we'll treat with Terazol 3 day cream follow up if symptoms persist or recur 2. Birth control I discussed all options including pill patch ring, Depo-Provera, Implanon, and IUD. After lengthy discussion she wants to try the pill she's never done this. I discussed how to use the pills, need for backup contraception with the first pack although I recommended continued use of condoms to help decrease STD risk. Every other month withdrawal options offbrand labeling discussed also. She has an appointment to see me in October for her annual she polyp at that time. I prescribed Microgestin 120 with refill times a year.

## 2011-02-06 ENCOUNTER — Encounter: Payer: 59 | Admitting: Gynecology

## 2011-02-06 LAB — POCT PREGNANCY, URINE
Operator id: 116391
Preg Test, Ur: NEGATIVE

## 2011-02-06 LAB — POCT URINALYSIS DIP (DEVICE)
Bilirubin Urine: NEGATIVE
Glucose, UA: NEGATIVE
Nitrite: NEGATIVE

## 2011-03-08 ENCOUNTER — Other Ambulatory Visit (HOSPITAL_COMMUNITY)
Admission: RE | Admit: 2011-03-08 | Discharge: 2011-03-08 | Disposition: A | Discharge status: Home / Self Care | Payer: 59 | Source: Ambulatory Visit | Attending: Gynecology | Admitting: Gynecology

## 2011-03-08 ENCOUNTER — Encounter: Payer: Self-pay | Admitting: Gynecology

## 2011-03-08 ENCOUNTER — Ambulatory Visit (INDEPENDENT_AMBULATORY_CARE_PROVIDER_SITE_OTHER): Payer: 59 | Admitting: Gynecology

## 2011-03-08 VITALS — BP 110/70 | Ht 65.0 in | Wt 150.0 lb

## 2011-03-08 DIAGNOSIS — D249 Benign neoplasm of unspecified breast: Secondary | ICD-10-CM

## 2011-03-08 DIAGNOSIS — Z113 Encounter for screening for infections with a predominantly sexual mode of transmission: Secondary | ICD-10-CM

## 2011-03-08 DIAGNOSIS — N912 Amenorrhea, unspecified: Secondary | ICD-10-CM

## 2011-03-08 DIAGNOSIS — Z01419 Encounter for gynecological examination (general) (routine) without abnormal findings: Secondary | ICD-10-CM

## 2011-03-08 DIAGNOSIS — N926 Irregular menstruation, unspecified: Secondary | ICD-10-CM

## 2011-03-08 DIAGNOSIS — Z13220 Encounter for screening for lipoid disorders: Secondary | ICD-10-CM

## 2011-03-08 NOTE — Progress Notes (Signed)
MILDRETH REEK 13-May-1985 [id number]        25 y.o.  for annual exam.  Had episode of deep dyspareunia last night that caused her to have pelvic pain on the left for about an hour or 2 following. No history of this before. She had been on oral contraceptives but stopped them because she forgot to take a pill and never restarted them.  Past medical history,surgical history, medications, allergies, family history and social history were all reviewed and documented in the EPIC chart. ROS:  Was performed and pertinent positives and negatives are included in the history.  Exam: chaperone present Filed Vitals:   03/08/11 1624  BP: 110/70   General appearance  Normal Skin grossly normal Head/Neck normal with no cervical or supraclavicular adenopathy thyroid normal Lungs  clear Cardiac RR, without RMG Abdominal  soft, nontender, without masses, organomegaly or hernia Breasts  examined lying and sitting. Right without masses, retractions, discharge or axillary adenopathy.  Left with small nodule 6:00 periphery 1-2 cm mobile firm no overlying skin changes or other masses, no nipple discharge, axillary adenopathy Pelvic  Ext/BUS/vagina  normal   Cervix  normal  Pap, GC Chlamydia screen done  Uterus  Antevert it, normal size, shape and contour, midline and mobile nontender   Adnexa  Without masses or tenderness    Anus and perineum  normal   Rectovaginal  normal sphincter tone without palpated masses or tenderness.    Assessment/Plan:  25 y.o. female for annual exam.    1. Deep dyspareunia times one episode. I reviewed with her that is probably an ovarian cyst or auditory change. Her pelvic exam is normal today. Have asked her just to keep a record if she has recurrent pelvic pain with intercourse and is to call me and we'll start with ultrasound. 2. Contraception. Patient had stopped the pills. She is unsure whether she wants to proceed with pregnancy now or not. I asked her to start on a  multivitamin with folic acid supplementation regardless. She still has birth control pills prescribed previously and if she decides against pregnancy she'll Sunday start after her next menses. 3. Irregular menses. Patient's last menses was the beginning of October although about a week ago she had some light spotting. Again she had stopped the pills. I will check a beta hCG for completeness, assuming negative then we'll monitor, if she remains with irregular menses or skip she knows to call me for further evaluation. 4. Hyperprolactinemia. Patient has history of marginal prolactinemia 2010 in the 30 and 29 range. All subsequent values have been normal with her last check in May 2012 at 17 and will stop further checking this. 5. History low-grade SIL. She had a low grade SIL Pap smear with follow up colposcopy and biopsy in September 2010. Follow Pap smear showed ASCUS in 2011 her most recent Pap smear May 2012 is normal. Pap was done today. If normal this will be 2 normals in a row and I recommended annual Pap smear follow up. The absolute need for follow up was stressed. 6. Fibroadenoma left breast. Patient has small probable fibroadenoma left breast as suggested by ultrasound evaluation. She's had serial ultrasounds that showed stable mass. Options for excision versus observation have been discussed with her in the past she is comfortable with observation has remained unchanged on her exam. She'll continue to observe as long as it remains unchanged we will follow. 7. Health maintenance. SBE on a monthly basis discussed and urged. Has never completed  the Gardasil series and has been recommended to do so in the past and has declined.  Will check baseline CBC  lipid profile urinalysis along with her beta hCG.  Screening GC and Chlamydia screen was also done.    Dara Lords MD, 4:59 PM 03/08/2011

## 2011-03-10 ENCOUNTER — Telehealth: Payer: Self-pay | Admitting: *Deleted

## 2011-03-10 NOTE — Telephone Encounter (Signed)
Pt called wanting recent lab results, results given to pt. 

## 2011-03-14 ENCOUNTER — Encounter: Payer: Self-pay | Admitting: Gynecology

## 2011-03-27 ENCOUNTER — Ambulatory Visit (INDEPENDENT_AMBULATORY_CARE_PROVIDER_SITE_OTHER): Payer: 59 | Admitting: Gynecology

## 2011-03-27 ENCOUNTER — Encounter: Payer: Self-pay | Admitting: Gynecology

## 2011-03-27 DIAGNOSIS — B9689 Other specified bacterial agents as the cause of diseases classified elsewhere: Secondary | ICD-10-CM

## 2011-03-27 DIAGNOSIS — IMO0002 Reserved for concepts with insufficient information to code with codable children: Secondary | ICD-10-CM

## 2011-03-27 DIAGNOSIS — A499 Bacterial infection, unspecified: Secondary | ICD-10-CM

## 2011-03-27 DIAGNOSIS — R87612 Low grade squamous intraepithelial lesion on cytologic smear of cervix (LGSIL): Secondary | ICD-10-CM

## 2011-03-27 DIAGNOSIS — N898 Other specified noninflammatory disorders of vagina: Secondary | ICD-10-CM

## 2011-03-27 DIAGNOSIS — B373 Candidiasis of vulva and vagina: Secondary | ICD-10-CM

## 2011-03-27 DIAGNOSIS — N760 Acute vaginitis: Secondary | ICD-10-CM

## 2011-03-27 MED ORDER — TINIDAZOLE 500 MG PO TABS: 2.0000 g | ORAL_TABLET | Freq: Once | ORAL | Status: AC

## 2011-03-27 NOTE — Patient Instructions (Signed)
Follow up for pathology results

## 2011-03-27 NOTE — Progress Notes (Signed)
Patient presents for colposcopy. She has a history of low-grade SIL Pap smear. She has been followed for low-grade changes with her initial house where 2 years ago showing low-grade changes with colposcopic biopsy confirming low-grade changes. She's had persistent low-grade changes since there although her previous Pap smear in May was negative this last Pap smear impartially return low-grade changes patient also notes vaginal odor.  Exam external BUS vagina with slight white discharge KOH wet prep done. Bimanual without masses or tenderness Colposcopy is adequate with endocervical speculum acetic acid cleanse.  Area of acetyl white change of the transformation zone at 12:00 was biopsied. Patient tolerated well we'll follow up her biopsy results.  Assessment and plan: 1. Vaginal odor. KOH wet prep does show BV. We'll treat with Tindamax 2 g daily x2 doses at her request. All points reviewed. 2. Persistent low-grade changes over 2 years. Colposcopy today shows acetowhite change biopsy taken. Normal or low-grade and will continue expectant management with every six-month Pap smears. If otherwise will triaged based upon results.

## 2011-03-29 ENCOUNTER — Telehealth: Payer: Self-pay | Admitting: *Deleted

## 2011-03-29 NOTE — Telephone Encounter (Signed)
Pt called stating she had lower pelvic pain starting today. Pt stated she never went and got her Rx for her BV infection back in September. Pt says that she will make take the medication as directed and follow up with appointment if needed.

## 2011-03-29 NOTE — Telephone Encounter (Signed)
Patient calling stating that she's having severe abdominal pain, stating that when she urinates it still feels like her bladder is empty, pt went to see her GYN on 03/27/11, and was started on new medication, I advised pt to call her GYN.

## 2011-03-31 ENCOUNTER — Encounter (HOSPITAL_COMMUNITY): Payer: Self-pay | Admitting: Emergency Medicine

## 2011-03-31 ENCOUNTER — Emergency Department (INDEPENDENT_AMBULATORY_CARE_PROVIDER_SITE_OTHER)
Admission: EM | Admit: 2011-03-31 | Discharge: 2011-03-31 | Disposition: A | Discharge status: Home / Self Care | Payer: 59 | Source: Home / Self Care | Attending: Family Medicine | Admitting: Family Medicine

## 2011-03-31 ENCOUNTER — Ambulatory Visit (INDEPENDENT_AMBULATORY_CARE_PROVIDER_SITE_OTHER): Payer: 59 | Admitting: Gynecology

## 2011-03-31 ENCOUNTER — Encounter: Payer: Self-pay | Admitting: Gynecology

## 2011-03-31 VITALS — BP 112/64 | Temp 98.7°F

## 2011-03-31 DIAGNOSIS — N390 Urinary tract infection, site not specified: Secondary | ICD-10-CM

## 2011-03-31 DIAGNOSIS — Z113 Encounter for screening for infections with a predominantly sexual mode of transmission: Secondary | ICD-10-CM

## 2011-03-31 DIAGNOSIS — K5289 Other specified noninfective gastroenteritis and colitis: Secondary | ICD-10-CM

## 2011-03-31 DIAGNOSIS — J111 Influenza due to unidentified influenza virus with other respiratory manifestations: Secondary | ICD-10-CM

## 2011-03-31 DIAGNOSIS — R6889 Other general symptoms and signs: Secondary | ICD-10-CM

## 2011-03-31 DIAGNOSIS — K529 Noninfective gastroenteritis and colitis, unspecified: Secondary | ICD-10-CM

## 2011-03-31 DIAGNOSIS — R82998 Other abnormal findings in urine: Secondary | ICD-10-CM

## 2011-03-31 MED ORDER — PROMETHAZINE HCL 12.5 MG PO TABS: 25.0000 mg | ORAL_TABLET | Freq: Four times a day (QID) | ORAL | Status: DC | PRN

## 2011-03-31 MED ORDER — CIPROFLOXACIN HCL 250 MG PO TABS: 250.0000 mg | ORAL_TABLET | Freq: Two times a day (BID) | ORAL | Status: DC

## 2011-03-31 NOTE — ED Notes (Signed)
Went to [facility] for abd pain today, was told to come to [facility] if symptoms did not improve

## 2011-03-31 NOTE — ED Provider Notes (Signed)
25 year old female with 3 days of sore throat, body aches, congestion, mild fever and chills. She did not get a flu shot this year. She denies any dyspnea or vomiting. She has several positive sick contacts at work. Additionally her children are also ill with respiratory tract infections.  She feels well otherwise.  PMH reviewed.  ROS as above otherwise neg Medications reviewed. No current facility-administered medications for this encounter.   Current Outpatient Prescriptions  Medication Sig Dispense Refill  . acetaminophen (TYLENOL) 325 MG tablet Take 650 mg by mouth every 6 (six) hours as needed.        . methylergonovine (METHERGINE) 0.2 MG tablet       . norethindrone-ethinyl estradiol (MICROGESTIN FE 1/20) 1-20 MG-MCG tablet Take 1 tablet by mouth daily.  1 Package  11  . prenatal vitamin w/FE, FA (PRENATAL 1 + 1) 27-1 MG TABS         Exam:  BP 114/80  Pulse 100  Temp(Src) 98.7 F (37.1 C) (Oral)  Resp 18  SpO2 100%  LMP 03/20/2011 Gen: Well NAD HEENT: EOMI,  MMM, posterior pharynx erythema no exudate Lungs: CTABL Nl WOB Heart: RRR no MRG Abd: NABS, NT, ND Exts: Non edematous BL  LE, warm and well perfused. ,  A/P: 25 year old female a flulike illness. Plan to treat symptomatically with Tylenol and ibuprofen. She is low risk for complications with influenza and greater than 48 hours therefore Tamiflu is not warranted. Reviewed red flag signs or symptoms patient expresses understanding. Will followup if not improving. Handout on influenza given  Clementeen Graham 03/31/11 2142

## 2011-03-31 NOTE — ED Notes (Signed)
Congestion, sore throat, and body aches starting Wednesday.

## 2011-03-31 NOTE — Progress Notes (Signed)
Patient presents with a history of left pelvic discomfort and not feeling like she is emptying her bladder all the way. She reports having a fever up to 101 and some nausea with vomiting episode along with diarrhea. She notes that whenever children is also sick with a gastroenteritis. She notes that her dyspareunia from before has resolved as well as her vaginal discharge I have recently seen her for.  Exam with chaperone present HEENT normal Lungs clear Cardiac regular rate no rubs murmurs or gallops Abdomen soft nontender without masses guarding rebound organomegaly active bowel sounds throughout Pelvic external BUS vagina normal, cervix normal GC Chlamydia screen done, uterus anteverted normal size midline mobile nontender, adnexa without masses or tenderness  Assessment and plan: Nausea, vomiting, diarrhea, feelings of bladder not emptying with some left pelvic pain, exam normal was normal temperature. Urinalysis questionable low-grade UTI. Will cover with ciprofloxacin 250 mg twice a day x7 days. Cover her nausea with Phenergan 25 mg #20, push fluids Tylenol, Motrin for temperature. I suspect she probably has a gastroenteritis for her symptoms but I do want to cover her for her UTI particularly since it is Friday for coverage over the weekend.  If her pelvic discomfort continues she will call next week and we'll schedule her for an ultrasound. I did a GC and Chlamydia screen for completeness.

## 2011-03-31 NOTE — Progress Notes (Signed)
Addended by: Landis Martins R on: 03/31/2011 11:53 AM   Modules accepted: Orders

## 2011-03-31 NOTE — Patient Instructions (Signed)
Take antibiotics as prescribed and Phenergan for her nausea. If pelvic pain continues call to schedule ultrasound. If all the symptoms resolved and follow up routinely as previously discussed for Pap smear.

## 2011-05-01 ENCOUNTER — Telehealth: Payer: Self-pay | Admitting: *Deleted

## 2011-05-01 NOTE — Telephone Encounter (Signed)
Wait one week. If she has not had a period by then office visit. If normal period starts before then then watch, if recurrent irregularity office visit.

## 2011-05-01 NOTE — Telephone Encounter (Signed)
Pt called c/o no period in December she did take a pregnancy test yesterday evening and it was negative. LMP:03/20/11 she noted that she did have some spotting only when she wiped, but no period flow. OV?  Please advise.

## 2011-05-01 NOTE — Telephone Encounter (Signed)
Lm for pt to call

## 2011-05-03 NOTE — Telephone Encounter (Signed)
Pt informed with the below note. 

## 2011-05-09 ENCOUNTER — Ambulatory Visit (INDEPENDENT_AMBULATORY_CARE_PROVIDER_SITE_OTHER): Payer: 59 | Admitting: Gynecology

## 2011-05-09 ENCOUNTER — Encounter: Payer: Self-pay | Admitting: Gynecology

## 2011-05-09 DIAGNOSIS — N926 Irregular menstruation, unspecified: Secondary | ICD-10-CM

## 2011-05-09 NOTE — Progress Notes (Signed)
Patient presents complaining of skipping her menses in December. She had relatively regular monthly menses and then did not have a period in December or January. She does have a history of marginal hypoprolactinemia although her last prolactin check was 17. She's not having any other symptoms like weight gain weight loss hair or skin changes. She is not using anything for contraception and would accept the pregnancy if that occurs. Need to be on a multivitamin discussed. Recently had colposcopy for low-grade SIL Pap smear and biopsy did show low-grade changes and she also acknowledges the need to repeat her Pap smear in 6 months.  Exam with Sherri chaperone present External BUS vagina normal. Cervix normal. Uterus anteverted normal size midline mobile nontender. Adnexa without masses or tenderness.  Assessment and plan: Skipping menses. Will check hCG and prolactin. Assuming negative will withdraw on Provera 10 mg twice a day x5 days will keep menstrual calendar. Assuming she restarts regular menses we'll monitor if irregularity continues she is to follow up with me for further evaluation and treatment. If labs abnormal then we'll triaged based upon results.

## 2011-05-09 NOTE — Patient Instructions (Signed)
Follow up for blood work. If negative will take Provera 10 mg twice daily for 5 days to bring on menses. If irregular menses continues then follow up in the office for exam. Follow up [place] in 6 months for repeat Pap smear due to atypia.

## 2011-05-10 LAB — HCG, SERUM, QUALITATIVE: Preg, Serum: NEGATIVE

## 2011-05-10 LAB — PROLACTIN: Prolactin: 18.4 ng/mL

## 2011-05-10 MED ORDER — MEDROXYPROGESTERONE ACETATE 10 MG PO TABS: 10.0000 mg | ORAL_TABLET | Freq: Two times a day (BID) | ORAL | Status: DC

## 2011-05-10 NOTE — Progress Notes (Signed)
Addended by: Dara Lords on: 05/10/2011 09:50 AM   Modules accepted: Orders

## 2011-06-07 ENCOUNTER — Telehealth: Payer: Self-pay | Admitting: *Deleted

## 2011-06-07 NOTE — Telephone Encounter (Signed)
Pt called c/o of not period since December 2012. Pt was given rx for provera 10 mg daily for 5 days on 05/09/11. Pt said pharmacy never got rx when she went up to pick up. So pt never took provera. I told pt to take a UPT in am with first urine and to call me and let me results so I could tell TF about this. Pt will do this and will call in am.

## 2011-06-13 ENCOUNTER — Ambulatory Visit (INDEPENDENT_AMBULATORY_CARE_PROVIDER_SITE_OTHER): Payer: 59 | Admitting: Gynecology

## 2011-06-13 ENCOUNTER — Encounter: Payer: Self-pay | Admitting: Gynecology

## 2011-06-13 DIAGNOSIS — N912 Amenorrhea, unspecified: Secondary | ICD-10-CM

## 2011-06-13 DIAGNOSIS — IMO0002 Reserved for concepts with insufficient information to code with codable children: Secondary | ICD-10-CM

## 2011-06-13 DIAGNOSIS — R109 Unspecified abdominal pain: Secondary | ICD-10-CM

## 2011-06-13 DIAGNOSIS — N9089 Other specified noninflammatory disorders of vulva and perineum: Secondary | ICD-10-CM

## 2011-06-13 LAB — PREGNANCY, URINE: Preg Test, Ur: NEGATIVE

## 2011-06-13 LAB — URINALYSIS W MICROSCOPIC + REFLEX CULTURE
Glucose, UA: NEGATIVE mg/dL
Hgb urine dipstick: NEGATIVE
Leukocytes, UA: NEGATIVE
Nitrite: NEGATIVE
Protein, ur: NEGATIVE mg/dL
Urobilinogen, UA: 0.2 mg/dL (ref 0.0–1.0)

## 2011-06-13 MED ORDER — MEDROXYPROGESTERONE ACETATE 10 MG PO TABS: 10.0000 mg | ORAL_TABLET | Freq: Every day | ORAL | Status: DC

## 2011-06-13 NOTE — Patient Instructions (Signed)
Follow up for blood results. Take Provera as prescribed. Follow up for ultrasound as scheduled.

## 2011-06-13 NOTE — Progress Notes (Signed)
Patient presents with 4 separate issues: 1. No menses since November. She had regular menses before hand. I saw her in December she had a normal prolactin (noting a history of low level hyperprolactinemia in the past). I prescribed Provera but she never took it and still has not started menses. She's not using anything for contraception and would accept a pregnancy that occurred. No weight changes hair changes skin changes or galactorrhea. 2. Dyspareunia with deep penetration. Has been going on for almost a year. When she was having her menses they were mild without significant cramping. 3. Small bump right side of her vulva noticed it recently. Nontender nondraining. 4. Diffuse abdominal pain from epigastric region to pelvic region comes and goes cramping associated with nausea and epigastric burning. Does not seem related to meals. Having regular bowel movements without diarrhea or constipation. No urinary symptoms.been going on for the last month or 2. No fevers chills.   Exam was Sherrilyn Rist chaperone present Abdomen with active bowel sounds soft nontender without masses guarding rebound organomegaly. Pelvic external with small sebaceous cyst right upper vulva lower mons region lateral to the clitoris mobile connected to the skin and overlying skin changes. No inguinal adenopathy bilaterally. BUS vagina normal. Cervix normal. Uterus normal size midline mobile nontender. Adnexa without masses or tenderness.  Assessment and plan: 1. Amenorrhea. Recheck qualitative hCG, TSH and FSH. Prolactin recently done which was normal. UPT today is negative. Again recommend a Provera withdrawal 10 mg x10 days and a menstrual calendar afterwards. Discussed contraception and she does not want this weeks of pregnancy that occurred. I encouraged her to start on a multivitamin with folic acid preconceptual he. If she continues to have amenorrhea or irregular periods discussed possible ovulation stimulation with Clomid if she  desires to go that route. Otherwise she can use intermittent progesterone withdrawals every other month if she decides against ovulation induction. 2. Dyspareunia. Recommend ultrasound to start. Possibilities of laparoscopy if this continues reviewed. Patient will schedule ultrasound follow up for this. 3. Classic sebaceous cyst right upper vulva. Is not bothersome to the patient and she will watch at present. If it enlarges or becomes tender sure represent. 4. Abdominal pain. I think she's having IBS possible GERD and I recommend she be seen by gastroenterologist and she agrees with this we'll go ahead and make that arrangement for her.

## 2011-06-14 LAB — FOLLICLE STIMULATING HORMONE: FSH: 8 m[IU]/mL

## 2011-06-14 LAB — TSH: TSH: 0.6 u[IU]/mL (ref 0.350–4.500)

## 2011-06-14 LAB — HCG, SERUM, QUALITATIVE: Preg, Serum: NEGATIVE

## 2011-06-15 ENCOUNTER — Telehealth: Payer: Self-pay | Admitting: *Deleted

## 2011-06-15 ENCOUNTER — Encounter: Payer: Self-pay | Admitting: Gynecology

## 2011-06-15 ENCOUNTER — Encounter: Payer: Self-pay | Admitting: Internal Medicine

## 2011-06-15 ENCOUNTER — Ambulatory Visit (INDEPENDENT_AMBULATORY_CARE_PROVIDER_SITE_OTHER): Payer: 59 | Admitting: Gynecology

## 2011-06-15 ENCOUNTER — Other Ambulatory Visit: Payer: Self-pay | Admitting: *Deleted

## 2011-06-15 ENCOUNTER — Ambulatory Visit (INDEPENDENT_AMBULATORY_CARE_PROVIDER_SITE_OTHER): Payer: 59

## 2011-06-15 DIAGNOSIS — N800 Endometriosis of uterus: Secondary | ICD-10-CM

## 2011-06-15 DIAGNOSIS — K589 Irritable bowel syndrome without diarrhea: Secondary | ICD-10-CM

## 2011-06-15 DIAGNOSIS — N926 Irregular menstruation, unspecified: Secondary | ICD-10-CM

## 2011-06-15 DIAGNOSIS — IMO0002 Reserved for concepts with insufficient information to code with codable children: Secondary | ICD-10-CM

## 2011-06-15 DIAGNOSIS — R109 Unspecified abdominal pain: Secondary | ICD-10-CM

## 2011-06-15 NOTE — Patient Instructions (Signed)
Take Provera as we discussed. If you do not have a period, call.  Or if you go more than 2 months without a period call.

## 2011-06-15 NOTE — Telephone Encounter (Signed)
Message copied by Libby Maw on Thu Jun 15, 2011 12:24 PM ------      Message from: Dara Lords      Created: Tue Jun 13, 2011 10:28 AM       Schedule appointment with gastroenterologist at [facility] or [facility] reference diffuse abdominal pain suspect IBS versus GERD

## 2011-06-15 NOTE — Telephone Encounter (Signed)
Patient informed appt set up with Dr. Rhea Belton on 06/29/11 @9 :45am.

## 2011-06-15 NOTE — Progress Notes (Signed)
Patient presents for ultrasound due to irregular menses and pelvic pain with dyspareunia. Ultrasound shows some cortical cystic areas in the myometrium right left ovaries are normal with multiple follicles and a suggestion of "ring of pearls" sign.  Assessment and plan: 1. Irregular menses. TSH prolactin FSH were normal. I think she is anovulatory.  Up recommended that she take the Provera. Assuming she has a withdrawal bleed we'll monitor and if she goes more than 2 months without menses she will call.  If she does not have a withdrawal bleed then she will call. Alternatives would be to start oral contraceptives now both for contraception and menstrual regulation which she declines at present. If she desires pregnancy and continues to be an ovulatory that I discussed possible Clomid treatment. I reviewed was involved with this as well as I discussed the risks to include hyperstimulation syndrome, multiple gestations, possible ovarian cancer linkage. Patient will let me know. 2. History low-grade SIL. Colposcopy in December with biopsy showing low-grade changes. Recommended repeat Pap smear in 6 months which would be June and she'll follow up at that time.

## 2011-06-26 ENCOUNTER — Encounter: Payer: Self-pay | Admitting: Internal Medicine

## 2011-06-29 ENCOUNTER — Encounter: Payer: Self-pay | Admitting: Internal Medicine

## 2011-06-29 ENCOUNTER — Ambulatory Visit (INDEPENDENT_AMBULATORY_CARE_PROVIDER_SITE_OTHER): Payer: 59 | Admitting: Internal Medicine

## 2011-06-29 DIAGNOSIS — K589 Irritable bowel syndrome without diarrhea: Secondary | ICD-10-CM | POA: Insufficient documentation

## 2011-06-29 DIAGNOSIS — R1013 Epigastric pain: Secondary | ICD-10-CM

## 2011-06-29 DIAGNOSIS — K3189 Other diseases of stomach and duodenum: Secondary | ICD-10-CM

## 2011-06-29 MED ORDER — FAMOTIDINE 20 MG PO TABS: 20.0000 mg | ORAL_TABLET | Freq: Two times a day (BID) | ORAL | Status: DC | PRN

## 2011-06-29 MED ORDER — LINACLOTIDE 290 MCG PO CAPS: 1.0000 | ORAL_CAPSULE | Freq: Every day | ORAL | Status: DC

## 2011-06-29 NOTE — Progress Notes (Signed)
Subjective:    Patient ID: [name], female    DOB: 08-Jan-1986, 26 y.o.   MRN: [id number]  HPI [name] is a 26 yo female with PMH of upper adenoma of the left breast, LGSIL, and mild elevation in prolactin level who seen in consultation at the request of Dr. Audie Box for evaluation of lower abdominal pain and constipation. The patient reports issues with bloating and constipation for years. This is associated with significant lower abdominal cramping. This O'Donnell cramping to be present for days, but usually is relieved by bowel movement until the cycle starts over again. He is having bowel movements about one out of every 3 days, often requiring straining at stool. She denies bright red blood or melena. She reports some nausea without vomiting. Her abdominal bloating is worse after meals, and also feels better after bowel movement. She endorses separate from her constipation and lower abdominal pain, and epigastric pain which is burning in nature. This happens much less frequently, and is usually associated with eating spicy or acidic foods. Orange juice is a particular food that brings this pain on. She denies heartburn or regurgitation of food. She has not taken anything for this epigastric pain. No fevers or chills. No issues with urination, dysuria, frequency, or hematuria.  Her last menstrual cycle was in November, however she started her cycle 2 days ago after recently completing Provera.  Review of Systems As per history of present illness, otherwise unremarkable  Past Medical History  Diagnosis Date  . Fibroadenoma of breast     LEFT  . LGSIL (low grade squamous intraepithelial dysplasia) 12/2008, 02/2011    C&B WITH LGSIL, paps ASCUS 6/11 &10/11, normal 5/12, LGSIL 02/2011  . Elevated prolactin level     30/29 12/2008, 16  10/2009,24  01/2010, 17 08/2010, 18 04/2011   Past Surgical History  Procedure Date  . Induced abortion [id number]    x2  . Adenoidectomy     ASA  CHILD   Current Outpatient Prescriptions  Medication Sig Dispense Refill  . HYDROcodone-acetaminophen (NORCO) 5-325 MG per tablet Take 1-2 tablets every 6 hours as needed for pain      . acetaminophen (TYLENOL) 325 MG tablet Take 650 mg by mouth every 6 (six) hours as needed.        . famotidine (PEPCID) 20 MG tablet Take 1 tablet (20 mg total) by mouth 2 (two) times daily as needed for heartburn (epigastric pain/Heartburn).  60 tablet  3  . Linaclotide (LINZESS) 290 MCG CAPS Take 1 tablet by mouth daily.  30 capsule  3  . medroxyPROGESTERone (PROVERA) 10 MG tablet Take 1 tablet (10 mg total) by mouth daily.  10 tablet  0   Allergies  Allergen Reactions  . Banana    History reviewed. No pertinent family history.  Social History  . Marital Status: Single    Number of Children: 1   Occupational History  . 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 Smoking status: Former Smoker -- 3 years    Types: Cigarettes    Quit date: 04/12/2010  . Smokeless tobacco: Never Used   Comment: Maybe 1 cigarette every 2 days  . Alcohol Use: Yes     Occasional  . Drug Use: Yes    Special: Marijuana     occasional  . Sexually Active: Yes -- Female partner(s)    Birth Control/ Protection: None      Objective:  Physical Exam BP 104/60  Pulse 80  Ht 5\' 4"  (1.626 m)  Wt 151 lb 8 oz (68.72 kg)  BMI 26.00 kg/m2  LMP 03/20/2011 Constitutional: Well-developed and well-nourished. No distress. HEENT: Normocephalic and atraumatic. Oropharynx is clear and moist. No oropharyngeal exudate. Conjunctivae are normal. Pupils are equal round and reactive to light. No scleral icterus. Neck: Neck supple. Trachea midline. Cardiovascular: Normal rate, regular rhythm and intact distal pulses. No M/R/G Pulmonary/chest: Effort normal and breath sounds normal. No wheezing, rales or rhonchi. Abdominal: Soft, mild lower abdominal tenderness to deep palpation without rebound or  guarding., nondistended. Bowel sounds active throughout. There are no masses palpable. No hepatosplenomegaly. Extremities: no clubbing, cyanosis, or edema Lymphadenopathy: No cervical adenopathy noted. Neurological: Alert and oriented to person place and time. Skin: Skin is warm and dry. No rashes noted. Psychiatric: Normal mood and affect. Behavior is normal.    Assessment & Plan:   26 yo female with PMH of upper adenoma of the left breast, LGSIL, and mild elevation in prolactin level who seen in consultation at the request of Dr. Audie Box for evaluation of lower abdominal pain and constipation.  1. Lower abd cramping/bloating/constipation -- the patient's lower abdominal pain and constipation is consistent with irritable bowel syndrome with constipation predominance. There are currently no other alarm symptoms, and this is a long-standing issue for her, all which is consistent with IBS. She has tried some over-the-counter laxatives without much benefit. We discussed IBS today, and I recommended a trial of Linzess 290 mcg daily.  This is the IBS approved dose and will likely help with her constipation, but also may help with her abdominal pain.  We've discussed how the biggest side effect is diarrhea, I've asked that she let me know if this is an issue.  2. epigastric pain/dyspepsia -- her upper GI symptoms are consistent with acid related disease. This is an infrequent problem for her, and therefore I recommended when necessary famotidine 20 mg.  She take this up to twice daily. This point I do not think she needs a daily PPI. We can reassess the symptoms at followup. She knows which foods to avoid and which foods seem to trigger her symptoms.  Followup in 4-6 weeks

## 2011-06-29 NOTE — Patient Instructions (Addendum)
We have sent the following medications to your pharmacy for you to pick up at your convenience: Linzess, please take as prescribed; you have also been given samples.  Famodatine (pepcid). Please take as prescribed.  Dr. Rhea Belton would like to see you back in the office for a follow up  In 4-6 wks.

## 2011-08-03 ENCOUNTER — Encounter: Payer: Self-pay | Admitting: Internal Medicine

## 2011-08-08 ENCOUNTER — Ambulatory Visit (INDEPENDENT_AMBULATORY_CARE_PROVIDER_SITE_OTHER): Payer: 59 | Admitting: Internal Medicine

## 2011-08-08 ENCOUNTER — Encounter: Payer: Self-pay | Admitting: Internal Medicine

## 2011-08-08 VITALS — BP 98/60 | HR 60 | Ht 64.5 in | Wt 152.4 lb

## 2011-08-08 DIAGNOSIS — K219 Gastro-esophageal reflux disease without esophagitis: Secondary | ICD-10-CM | POA: Insufficient documentation

## 2011-08-08 DIAGNOSIS — K589 Irritable bowel syndrome without diarrhea: Secondary | ICD-10-CM

## 2011-08-08 MED ORDER — LINACLOTIDE 145 MCG PO CAPS: 1.0000 | ORAL_CAPSULE | Freq: Every day | ORAL | Status: DC

## 2011-08-08 NOTE — Progress Notes (Signed)
  Subjective:    Patient ID: [name], female    DOB: 1985/06/12, 26 y.o.   MRN: [id number]  HPI [name] is a 26 yo PMH of upper adenoma of the left breast, LGSIL, and mild elevation in prolactin level who seen in follow-up for IBS-C.  She was seen about 4 weeks ago and started on Linzess.  She reports this helped significantly with her constipation along with her abdominal bloating and pain. She was having 3-5 bowel movements a day while on this therapy. She felt like she was going perhaps a bit too much, and occasionally was having diarrhea. She's been out of this medication for the last few days, and her constipation, abdominal pain/cramping and bloating have returned. She still denies fevers or chills. No nausea or vomiting. No rectal bleeding or melena.  Review of Systems As per history of present illness, otherwise negative  Current Medications, Allergies, Past Medical History, Past Surgical History, Family History and Social History were reviewed in [employer] record.     Objective:   Physical Exam BP 98/60  Pulse 60  Ht 5' 4.5" (1.638 m)  Wt 152 lb 6.4 oz (69.128 kg)  BMI 25.76 kg/m2  LMP 08/02/2011 Constitutional: Well-developed and well-nourished. No distress. HEENT: Normocephalic and atraumatic. Oropharynx is clear and moist. No oropharyngeal exudate. Conjunctivae are normal. Pupils are equal round and reactive to light. No scleral icterus. Cardiovascular: Normal rate, regular rhythm and intact distal pulses. No M/R/G Pulmonary/chest: Effort normal and breath sounds normal. No wheezing, rales or rhonchi. Abdominal: Soft, nontender, mild distention. Bowel sounds active throughout. There are no masses palpable. No hepatosplenomegaly. Extremities: no clubbing, cyanosis, or edema Neurological: Alert and oriented to person place and time. Skin: Skin is warm and dry. No rashes noted. Psychiatric: Normal mood and affect. Behavior is normal.    Assessment & Plan:  [name] is a 26 yo PMH of upper adenoma of the left breast, LGSIL, and mild elevation in prolactin level who seen in follow-up for IBS-C  1. IBS-C -- the patient seemed to respond very well with excellent symptom relief on her trial of Linzess. I do think she may have been having excessive loose stool, and perhaps this is dose related. She was on the 290 mcg dose. I like to try her on the 145 mcg dose of Linzess once daily. Hopefully this will give her relief of her constipation, bloating, and abdominal pain without the diarrhea. I've asked her to contact me if she has diarrhea on this new, reduced dose. She voices understanding.  2. GERD -- infrequent, no alarm symptoms, and well treated with when necessary famotidine  Followup in 3 months, sooner if necessary

## 2011-08-08 NOTE — Patient Instructions (Signed)
We have sent the following medications to your pharmacy for you to pick up at your convenience: Linzess take as directed  Follow up with Dr. Rhea Belton in 3 months

## 2011-08-14 ENCOUNTER — Institutional Professional Consult (permissible substitution): Payer: 59 | Admitting: Gynecology

## 2011-09-05 ENCOUNTER — Institutional Professional Consult (permissible substitution): Payer: 59 | Admitting: Gynecology

## 2011-09-26 ENCOUNTER — Institutional Professional Consult (permissible substitution): Payer: 59 | Admitting: Gynecology

## 2011-09-27 ENCOUNTER — Telehealth: Payer: Self-pay | Admitting: *Deleted

## 2011-09-27 MED ORDER — NORETHINDRONE ACET-ETHINYL EST 1-20 MG-MCG PO TABS: 1.0000 | ORAL_TABLET | Freq: Every day | ORAL | Status: DC

## 2011-09-27 NOTE — Telephone Encounter (Signed)
Pt calling requesting refill on microgestin fe 1/20 she is not  planning pregnancy now, rx sent.

## 2011-10-17 ENCOUNTER — Ambulatory Visit (INDEPENDENT_AMBULATORY_CARE_PROVIDER_SITE_OTHER): Payer: 59 | Admitting: Gynecology

## 2011-10-17 ENCOUNTER — Encounter: Payer: Self-pay | Admitting: Gynecology

## 2011-10-17 DIAGNOSIS — N926 Irregular menstruation, unspecified: Secondary | ICD-10-CM

## 2011-10-17 NOTE — Progress Notes (Signed)
Patient presents with her partner to discuss pregnancy trial. She has a history of irregular menses where she will skip months at a time. I last saw her the beginning of this year when she took a progesterone withdrawal end of February/beginning of March and then had spontaneous periods March, April, May and June. She had normal hormone levels to include TSH FSH prolactin.  I reviewed with them normal fertility and the differential with infertility to include female factor, cervical and uterine factor both structural/hormonal, tubal/peritoneal and ovulatory factors. Approaches to evaluation to include semen analysis, HSG, ovulatory hormonal studies, luteal phase evaluation all discussed. I think given her history she clearly has an ovulatory defect. In discussion it's not clear that they are committed to proceeding with pregnancy at this point and I advised them to discuss this at home to make sure that they're both committed to pregnancy and child rearing. The need to be on a multivitamin with folic acid now preconceptualy was stressed and I encouraged her to start this.  If they decide to proceed now as it does appear that she is ovulatory the last several cycles that she should use LH predictor kits and I discussed how to do this and assuming she continues to have monthly menses to try over the next 3-4 months. If she would skip menses then we would consider Clomid stimulation. I discussed what is involved with Clomid, how to take it, side effect profile and the risks to include multiple gestation, hyperstimulation syndrome and possible ovarian cancer linkage. The patient and her partner will follow up with me if they decide to proceed with Clomid and she starts to skip cycles or if she continues with regular cycles and does not achieve pregnancy in 4 months. Possibilities of going back then and reassessing tubal status as well as semen analysis was reviewed.

## 2011-10-17 NOTE — Patient Instructions (Addendum)
Follow up if without pregnancy or if significant irregular periods.  Start a multivitamin with folic acid now.

## 2011-12-13 ENCOUNTER — Encounter: Payer: Self-pay | Admitting: Gynecology

## 2011-12-13 ENCOUNTER — Ambulatory Visit (INDEPENDENT_AMBULATORY_CARE_PROVIDER_SITE_OTHER): Payer: 59 | Admitting: Gynecology

## 2011-12-13 DIAGNOSIS — N949 Unspecified condition associated with female genital organs and menstrual cycle: Secondary | ICD-10-CM

## 2011-12-13 DIAGNOSIS — IMO0002 Reserved for concepts with insufficient information to code with codable children: Secondary | ICD-10-CM

## 2011-12-13 DIAGNOSIS — Z113 Encounter for screening for infections with a predominantly sexual mode of transmission: Secondary | ICD-10-CM

## 2011-12-13 DIAGNOSIS — R102 Pelvic and perineal pain: Secondary | ICD-10-CM

## 2011-12-13 DIAGNOSIS — N898 Other specified noninflammatory disorders of vagina: Secondary | ICD-10-CM

## 2011-12-13 LAB — WET PREP FOR TRICH, YEAST, CLUE
Trich, Wet Prep: NONE SEEN
Yeast Wet Prep HPF POC: NONE SEEN

## 2011-12-13 MED ORDER — METRONIDAZOLE 500 MG PO TABS: 500.0000 mg | ORAL_TABLET | Freq: Two times a day (BID) | ORAL | Status: AC

## 2011-12-13 NOTE — Patient Instructions (Signed)
Take Flagyl medication as prescribed twice daily for one week. Avoid alcohol. Office will contact you to arrange surgery as we discussed.

## 2011-12-13 NOTE — Progress Notes (Signed)
Patient presents with one-week history of vaginal discharge with odor. Also continues to have deep dyspareunia as if something is being hit with every coital episode. Notes her menses are getting more crampy.  Exam with Amy assistant Abdomen soft nontender without masses guarding rebound organomegaly. Pelvic external BUS vagina with frothy white discharge. Cervix normal. Uterus normal size anteverted midline mobile nontender. Adnexa without masses or tenderness.  Assessment and plan: 1. Vaginal discharge. KOH wet prep is positive for bacterial vaginosis. We'll treat with Flagyl 500 mg twice a day x7 days, alcohol avoidance reviewed. GC Chlamydia screen done. 2. Persistent deep dyspareunia worsening dysmenorrhea over the past year. Ultrasound February 2013 normal. Reviewed options and I think laparoscopy is the best choice rule out endometriosis/adhesive disease. Can also do chromopertubation at the same time as she is attempting pregnancy without success of this past year. I think this is more ovulatory irregularity. She does skip menses and had normal TSH FSH prolactin.  I reviewed in general was involved with laparoscopy to include multiple port sites insufflation trocar placement use of sharp blunt dissection electrocautery laser harmonic scalpel all reviewed.  General risks also discussed. Patient wants to proceed with this and we'll go ahead and schedule this she'll represent for a full preoperative consult.

## 2011-12-14 ENCOUNTER — Telehealth: Payer: Self-pay | Admitting: *Deleted

## 2011-12-14 LAB — GC/CHLAMYDIA PROBE AMP, GENITAL: Chlamydia, DNA Probe: NEGATIVE

## 2011-12-14 NOTE — Telephone Encounter (Signed)
Pt called to ask if her boyfriend should have any type of treatment for BV. P  informed if no s/s her should be fine.

## 2011-12-15 ENCOUNTER — Telehealth: Payer: Self-pay | Admitting: Gynecology

## 2011-12-15 NOTE — Telephone Encounter (Signed)
I contacted patient regarding scheduling Dx Lap w Chromopertubation.  Patient has a $1600 deductible that has nothing applied to it.  I advised her that she would need to make $1500 surgery pre-payment to [facility] in addition to her hospital bill.  Patient said this is definitely not something she can afford.  She asked if there was anything else Dr. Velvet Bathe could recommend at this time as an option.

## 2011-12-15 NOTE — Telephone Encounter (Signed)
Left message for patient to call me regarding scheduling surgery. 

## 2011-12-18 NOTE — Telephone Encounter (Signed)
Patient informed. Said she will "work on it and call".

## 2011-12-18 NOTE — Telephone Encounter (Signed)
Not from a pain stand point other than to offer a lower up front requirement, ie 1/2 ($750)

## 2011-12-27 ENCOUNTER — Telehealth: Payer: Self-pay | Admitting: *Deleted

## 2011-12-27 NOTE — Telephone Encounter (Signed)
LM for pt [name] 

## 2011-12-27 NOTE — Telephone Encounter (Signed)
Pt called back when I tried her back we got disconnected and i tried for another 5 minutes and then left a message asking her to call me tomorrow. KW

## 2011-12-27 NOTE — Telephone Encounter (Signed)
When was her last menstrual period and how have her periods been over the last 3 months?

## 2011-12-27 NOTE — Telephone Encounter (Signed)
Pt called to follow up regarding a discussion on starting Clomid, was discussed in June. Please advise

## 2011-12-28 MED ORDER — CLOMIPHENE CITRATE 50 MG PO TABS: 50.0000 mg | ORAL_TABLET | Freq: Every day | ORAL | Status: AC

## 2011-12-28 NOTE — Telephone Encounter (Signed)
Clomid 50 mg daily days 5 through 9 with menses start. If without menses in another week call after checking a home pregnancy test and all give her Provera to bring on a menses. Timed intercourse around day 12 through day 18 call at the end of the cycle if menses starts or if without menses Check pregnancy test by day 30

## 2011-12-28 NOTE — Telephone Encounter (Signed)
spoke to pt and she said she didn't have one in July then 8/5 and still waiting for Sept but feels like she could start any day. PLease advise

## 2011-12-28 NOTE — Telephone Encounter (Signed)
Left message for patient to call me back. I did leave a detailed message but asked the pt to call me back KW

## 2012-01-18 ENCOUNTER — Other Ambulatory Visit: Payer: Self-pay | Admitting: Gynecology

## 2012-01-23 NOTE — Telephone Encounter (Signed)
I tried patient again to see what was going on. She said she  Never called started her period. I told her as TF said to check a pregnancy test and let me know results then I can call in Provera. Pt to call me back. She said she never picked up Clomid Rx. [name]

## 2012-01-25 ENCOUNTER — Telehealth: Payer: Self-pay | Admitting: *Deleted

## 2012-01-25 NOTE — Telephone Encounter (Signed)
Pt called to say she started her period today. She can begin Clomid 50mg  D5-9 and have intercourse D12-18 at least everyother day. Check a preg test on D30 if still no period. Lm for pt to return my call. KW

## 2012-02-20 NOTE — Telephone Encounter (Signed)
Pt never returned call. Will inform her the next time I speak to her the importance of returning my call if going to do Clomid. KW

## 2012-02-27 ENCOUNTER — Telehealth: Payer: Self-pay | Admitting: Gynecology

## 2012-02-27 NOTE — Telephone Encounter (Signed)
Patient is considering scheduling surgery that she talked with Dr. Velvet Bathe about back in August.  Diagnostic Lap.  She has some financial issues and needs some assistance making arrangements.   I will check on this and call her back.

## 2012-02-27 NOTE — Telephone Encounter (Signed)
Per 12/18/11 note in chart patient was advised paying 1/2 of her upfront surgery pre-payment to [facility] was approved.  Surgery financial letter provided to patient with her ins benefits and physician surgery charges.  I told patient I had left message with lady at [facility] to assist her with getting estimate for hospital charges and what they will ask for in advance.  She will come by to pick up letter.

## 2012-03-01 ENCOUNTER — Telehealth: Payer: Self-pay | Admitting: Gynecology

## 2012-03-01 ENCOUNTER — Encounter: Payer: Self-pay | Admitting: Gynecology

## 2012-03-01 NOTE — Telephone Encounter (Signed)
Patient has worked out the financial end of surgery and is ready to schedule surgery.  Please see your note dated 12/13/11.  It looks like Diag Lap with Chromopertubation. Will you please fill out your surgery slip as well? Thanks!

## 2012-03-01 NOTE — Telephone Encounter (Signed)
Surgery slip done. Patient will need preoperative consult

## 2012-03-01 NOTE — Telephone Encounter (Signed)
Surgery scheduled for 03/20/12 7:30am at [facility].  Patient informed/instructed. Pre-op consult scheduled for 03/14/12.

## 2012-03-11 ENCOUNTER — Encounter (HOSPITAL_COMMUNITY): Payer: Self-pay | Admitting: Pharmacist

## 2012-03-14 ENCOUNTER — Telehealth: Payer: Self-pay | Admitting: *Deleted

## 2012-03-14 ENCOUNTER — Institutional Professional Consult (permissible substitution): Payer: 59 | Admitting: Gynecology

## 2012-03-14 ENCOUNTER — Encounter: Payer: Self-pay | Admitting: *Deleted

## 2012-03-14 NOTE — Telephone Encounter (Signed)
Pt called back and said she couldn't come up with her portion of the finances therefore she is cancelling the surgery.

## 2012-03-14 NOTE — Telephone Encounter (Signed)
I called home number which had been cancelled and then called the cell and left a message on a VM.and sent a certified letter to the pt in case she didn't called.

## 2012-03-20 ENCOUNTER — Encounter (HOSPITAL_COMMUNITY): Admission: RE | Payer: Self-pay | Source: Ambulatory Visit

## 2012-03-20 ENCOUNTER — Ambulatory Visit (HOSPITAL_COMMUNITY): Admission: RE | Admit: 2012-03-20 | Payer: 59 | Source: Ambulatory Visit | Admitting: Gynecology

## 2012-03-20 SURGERY — LAPAROSCOPY OPERATIVE
Anesthesia: General

## 2012-04-15 ENCOUNTER — Telehealth: Payer: Self-pay | Admitting: *Deleted

## 2012-04-15 NOTE — Telephone Encounter (Signed)
Pt called requesting a Rx for Clomid. I advised an OV was needed since we have not seen her lately. And since she canceled her surgery. KW

## 2012-05-28 ENCOUNTER — Telehealth: Payer: Self-pay

## 2012-05-28 NOTE — Telephone Encounter (Signed)
Patient called and asked if I would check insurance benefits for her new ins at of 04/24/12.  She is still waiting to schedule diagnostic lap and wondered if new ins might make it more affordable.  I called her and explained her benefits.  She did not understand and asked me to speak with her mom and explain and put her mom on the line.  Her mom is a patient here and is scheduled for surgery soon and we discussed that her charges may meet the family deductible and if that is the case then patient would only have 20% co-insurance to pay.  Mom said she was going to have daughter wait and let her have surgery and see how things look after all her claims go in.  They will call me when ready to schedule.

## 2012-06-06 ENCOUNTER — Encounter: Payer: 59 | Admitting: Gynecology

## 2012-06-14 ENCOUNTER — Ambulatory Visit (INDEPENDENT_AMBULATORY_CARE_PROVIDER_SITE_OTHER): Payer: 59 | Admitting: Gynecology

## 2012-06-14 ENCOUNTER — Encounter: Payer: Self-pay | Admitting: Gynecology

## 2012-06-14 ENCOUNTER — Other Ambulatory Visit (HOSPITAL_COMMUNITY)
Admission: RE | Admit: 2012-06-14 | Discharge: 2012-06-14 | Disposition: A | Discharge status: Home / Self Care | Payer: 59 | Source: Ambulatory Visit | Attending: Gynecology | Admitting: Gynecology

## 2012-06-14 VITALS — BP 112/66 | Ht 65.0 in | Wt 156.0 lb

## 2012-06-14 DIAGNOSIS — IMO0002 Reserved for concepts with insufficient information to code with codable children: Secondary | ICD-10-CM

## 2012-06-14 DIAGNOSIS — Z01419 Encounter for gynecological examination (general) (routine) without abnormal findings: Secondary | ICD-10-CM

## 2012-06-14 DIAGNOSIS — N898 Other specified noninflammatory disorders of vagina: Secondary | ICD-10-CM

## 2012-06-14 DIAGNOSIS — Z113 Encounter for screening for infections with a predominantly sexual mode of transmission: Secondary | ICD-10-CM

## 2012-06-14 DIAGNOSIS — R6889 Other general symptoms and signs: Secondary | ICD-10-CM

## 2012-06-14 DIAGNOSIS — E229 Hyperfunction of pituitary gland, unspecified: Secondary | ICD-10-CM

## 2012-06-14 DIAGNOSIS — E221 Hyperprolactinemia: Secondary | ICD-10-CM

## 2012-06-14 DIAGNOSIS — N979 Female infertility, unspecified: Secondary | ICD-10-CM

## 2012-06-14 DIAGNOSIS — Z13220 Encounter for screening for lipoid disorders: Secondary | ICD-10-CM

## 2012-06-14 LAB — LIPID PANEL
Cholesterol: 185 mg/dL (ref 0–200)
HDL: 50 mg/dL (ref >39)
LDL Cholesterol: 124 mg/dL — ABNORMAL HIGH (ref 0–99)
Total CHOL/HDL Ratio: 3.7 Ratio
Triglycerides: 56 mg/dL (ref <150)
VLDL: 11 mg/dL (ref 0–40)

## 2012-06-14 LAB — URINALYSIS W MICROSCOPIC + REFLEX CULTURE
Glucose, UA: NEGATIVE mg/dL
Ketones, ur: NEGATIVE mg/dL
Nitrite: NEGATIVE
Specific Gravity, Urine: >1.03 — ABNORMAL HIGH (ref 1.005–1.030)
pH: 5.5 (ref 5.0–8.0)

## 2012-06-14 LAB — CBC WITH DIFFERENTIAL/PLATELET
HCT: 38.6 % (ref 36.0–46.0)
Hemoglobin: 12.5 g/dL (ref 12.0–15.0)
Lymphs Abs: 2.9 10*3/uL (ref 0.7–4.0)
Monocytes Absolute: 0.4 10*3/uL (ref 0.1–1.0)
Monocytes Relative: 7 % (ref 3–12)
Neutro Abs: 2.6 10*3/uL (ref 1.7–7.7)
Neutrophils Relative %: 43 % (ref 43–77)
RBC: 4.63 MIL/uL (ref 3.87–5.11)

## 2012-06-14 LAB — WET PREP FOR TRICH, YEAST, CLUE

## 2012-06-14 LAB — COMPREHENSIVE METABOLIC PANEL
ALT: 13 U/L (ref 0–35)
AST: 17 U/L (ref 0–37)
Alkaline Phosphatase: 44 U/L (ref 39–117)
BUN: 11 mg/dL (ref 6–23)
Calcium: 9.3 mg/dL (ref 8.4–10.5)
Chloride: 105 mEq/L (ref 96–112)
Creat: 0.78 mg/dL (ref 0.50–1.10)
Total Bilirubin: 0.3 mg/dL (ref 0.3–1.2)

## 2012-06-14 MED ORDER — METRONIDAZOLE 500 MG PO TABS: 500.0000 mg | ORAL_TABLET | Freq: Two times a day (BID) | ORAL | Status: DC

## 2012-06-14 NOTE — Progress Notes (Signed)
[name] 20-Mar-1986 [id number]        27 y.o.  [id number] for annual exam.  Several issues noted below.  Past medical history,surgical history, medications, allergies, family history and social history were all reviewed and documented in the EPIC chart. ROS:  Was performed and pertinent positives and negatives are included in the history.  Exam: [name] Filed Vitals:   06/14/12 0915  BP: 112/66  Height: 5\' 5"  (1.651 m)  Weight: 156 lb (70.761 kg)   General appearance  Normal Skin grossly normal Head/Neck normal with no cervical or supraclavicular adenopathy thyroid normal Lungs  clear Cardiac RR, without RMG Abdominal  soft, nontender, without masses, organomegaly or hernia Breasts  examined lying and sitting without masses, retractions, discharge or axillary adenopathy.  Left nipple stud. Right removed the stud. Pelvic  Ext/BUS/vagina  heavy white discharge  Cervix  normal Pap/GC/Chlamydia  Uterus  anteverted, normal size, shape and contour, midline and mobile nontender   Adnexa  Without masses or tenderness    Anus and perineum  normal   Rectovaginal  normal sphincter tone without palpated masses or tenderness.    Assessment/Plan:  27 y.o. [id number] female for annual exam.   1. Vaginal discharge with odor. 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 We'll treat with Flagyl 500 mg twice a day x7 days, alcohol avoidance reviewed. Follow up if symptoms persist or recur. 2. Secondary infertility.  History of oligo-ovulatorytype cycles previously. Negative workup to include normal prolactin TSH FSH. Was to start on Clomid but never did. Her menses now are regular with moliminal symptoms. I reviewed secondary infertility evaluation with her and recommend starting with semen analysis and HSG. Options for treatment up to and including super ovulation or laparoscopy rule out adhesive/endometriosis reviewed. Will have office staff arrange for the semen analysis HSG. She  knows that I want her to take antibiotics prophylactically, Vibramycin 100 mg twice a day x5 days starting the evening before and that my staff should call this in for her and she knows to do this.   3. History LGSIL.  History of low-grade SIL over the past several years with colposcopic biopsy December 2012 showing LGSIL.  Was to follow up for Pap smear in 6 months but did not do that. Pap done today and we'll triage as to results. 4. History mild hyperprolactinemia.  History of low level hyperprolactinemia. Last value a year ago January 2000 1318.4. Will recheck now. 5. STD screening.  Patient requests STD screening. No specific exposure history. GC Chlamydia, hepatitis B, hepatitis C, RPR, HIV done. 6. Breast health.  SBE monthly reviewed. 7. Health maintenance.  Baseline CBC comprehensive metabolic panel lipid profile urinalysis done with the above lab work.    Cassandra Lords MD, 10:03 AM 06/14/2012

## 2012-06-14 NOTE — Patient Instructions (Signed)
Office will contact you to arrange the x-ray study for fallopian tubes and semen analysis

## 2012-06-15 LAB — HIV ANTIBODY (ROUTINE TESTING W REFLEX): HIV: NONREACTIVE

## 2012-06-15 LAB — HEPATITIS B SURFACE ANTIGEN: Hepatitis B Surface Ag: NEGATIVE

## 2012-06-15 LAB — HEPATITIS C ANTIBODY: HCV Ab: NEGATIVE

## 2012-06-16 LAB — GC/CHLAMYDIA PROBE AMP
CT Probe RNA: NEGATIVE
GC Probe RNA: NEGATIVE

## 2012-06-16 LAB — URINE CULTURE

## 2012-06-17 ENCOUNTER — Other Ambulatory Visit: Payer: Self-pay | Admitting: Gynecology

## 2012-06-17 DIAGNOSIS — R7881 Bacteremia: Secondary | ICD-10-CM

## 2012-06-25 ENCOUNTER — Telehealth: Payer: Self-pay | Admitting: *Deleted

## 2012-06-25 NOTE — Telephone Encounter (Signed)
Tried to reach pt today to discuss HSG/ SA but her number stated that the caller was unavailable at this time and to call back later. KW

## 2012-07-05 NOTE — Telephone Encounter (Signed)
Continued to try pt and she never called the office either. KW

## 2012-07-11 ENCOUNTER — Ambulatory Visit (INDEPENDENT_AMBULATORY_CARE_PROVIDER_SITE_OTHER): Payer: 59 | Admitting: Gynecology

## 2012-07-11 ENCOUNTER — Encounter: Payer: Self-pay | Admitting: Gynecology

## 2012-07-11 DIAGNOSIS — R7881 Bacteremia: Secondary | ICD-10-CM

## 2012-07-11 DIAGNOSIS — N912 Amenorrhea, unspecified: Secondary | ICD-10-CM

## 2012-07-11 LAB — PREGNANCY, URINE: Preg Test, Ur: POSITIVE

## 2012-07-11 NOTE — Patient Instructions (Signed)
Follow up for ultrasound as scheduled 

## 2012-07-11 NOTE — Progress Notes (Signed)
Patient presents with LMP 05/24/2012 with positive home pregnancy test. No cramping bleeding or other symptoms. Does have history of prior spontaneous AB at 8 weeks.  Exam with Kim assistant Abdomen soft nontender without masses guarding rebound organomegaly. Pelvic external BUS vagina normal. Cervix normal. Uterus soft proximally 6 weeks size midline mobile nontender. Adnexa without masses or tenderness.  Assessment and plan: 1. Early IUP. Check ultrasound for viability given past history of SAB. Patient understands we do not do obstetrics and she will need to establish care with an obstetrical group in town for continued ongoing care. Start on prenatal vitamin and she is not taking this now. 2. Low level beta strep bacteria 20,000 colonies prior urinalysis. Recheck urine culture today. Will treat with ampicillin but await culture results first.

## 2012-07-13 ENCOUNTER — Inpatient Hospital Stay (HOSPITAL_COMMUNITY)
Admission: AD | Admit: 2012-07-13 | Discharge: 2012-07-14 | Disposition: A | Discharge status: Home / Self Care | Payer: 59 | Source: Ambulatory Visit | Attending: Gynecology | Admitting: Gynecology

## 2012-07-13 ENCOUNTER — Inpatient Hospital Stay (HOSPITAL_COMMUNITY): Payer: 59

## 2012-07-13 ENCOUNTER — Encounter (HOSPITAL_COMMUNITY): Payer: Self-pay | Admitting: *Deleted

## 2012-07-13 DIAGNOSIS — R1032 Left lower quadrant pain: Secondary | ICD-10-CM | POA: Insufficient documentation

## 2012-07-13 DIAGNOSIS — A499 Bacterial infection, unspecified: Secondary | ICD-10-CM | POA: Insufficient documentation

## 2012-07-13 DIAGNOSIS — O210 Mild hyperemesis gravidarum: Secondary | ICD-10-CM | POA: Insufficient documentation

## 2012-07-13 DIAGNOSIS — O26899 Other specified pregnancy related conditions, unspecified trimester: Secondary | ICD-10-CM

## 2012-07-13 DIAGNOSIS — B9689 Other specified bacterial agents as the cause of diseases classified elsewhere: Secondary | ICD-10-CM | POA: Insufficient documentation

## 2012-07-13 DIAGNOSIS — N760 Acute vaginitis: Secondary | ICD-10-CM | POA: Insufficient documentation

## 2012-07-13 DIAGNOSIS — O219 Vomiting of pregnancy, unspecified: Secondary | ICD-10-CM

## 2012-07-13 DIAGNOSIS — O2390 Unspecified genitourinary tract infection in pregnancy, unspecified trimester: Secondary | ICD-10-CM | POA: Insufficient documentation

## 2012-07-13 DIAGNOSIS — O2690 Pregnancy related conditions, unspecified, unspecified trimester: Secondary | ICD-10-CM

## 2012-07-13 LAB — WET PREP, GENITAL: Yeast Wet Prep HPF POC: NONE SEEN

## 2012-07-13 LAB — CBC
HCT: 40.9 % (ref 36.0–46.0)
Hemoglobin: 13.8 g/dL (ref 12.0–15.0)
MCH: 27.8 pg (ref 26.0–34.0)
MCHC: 33.7 g/dL (ref 30.0–36.0)
MCV: 82.3 fL (ref 78.0–100.0)

## 2012-07-13 LAB — URINE MICROSCOPIC-ADD ON

## 2012-07-13 LAB — URINALYSIS, ROUTINE W REFLEX MICROSCOPIC
Bilirubin Urine: NEGATIVE
Hgb urine dipstick: NEGATIVE
Protein, ur: 30 mg/dL — AB
Urobilinogen, UA: 0.2 mg/dL (ref 0.0–1.0)

## 2012-07-13 LAB — URINE CULTURE
Colony Count: NO GROWTH
Organism ID, Bacteria: NO GROWTH

## 2012-07-13 MED ORDER — ONDANSETRON 8 MG PO TBDP: 8.0000 mg | ORAL_TABLET | Freq: Once | ORAL | Status: DC

## 2012-07-13 MED ORDER — LACTATED RINGERS IV BOLUS (SEPSIS)
1000.0000 mL | Freq: Once | INTRAVENOUS | Status: AC
Start: 2012-07-13 — End: 2012-07-14
  Administered 2012-07-14: 1000 mL via INTRAVENOUS

## 2012-07-13 MED ORDER — ONDANSETRON HCL 4 MG/2ML IJ SOLN
4.0000 mg | Freq: Once | INTRAMUSCULAR | Status: AC
  Administered 2012-07-14: 4 mg via INTRAVENOUS
  Filled 2012-07-13: qty 2

## 2012-07-13 NOTE — MAU Provider Note (Signed)
History     CSN: [id number]  Arrival date and time: 07/13/12 2139   First Provider Initiated Contact with Patient 07/13/12 2239      Chief Complaint  Patient presents with  . Abdominal Cramping   HPI [name] is a 27 y.o. [phone] at [redacted]w[redacted]d who presents to MAU today with complaint of LLQ pain. The patient had +HPT ~ 3 weeks ago and was seen by Dr. Audie Box last week. She had +UPT in the office. She has [place] scheduled for Wednesday in the office. LMP 05/24/12, although patient states she knows it was late January, dating is uncertain. LLQ pain started about 3 hours prior to arrival. Rated at 6/10 now. She also had N/V which has been consistent x 3 weeks. She denies fever, vaginal bleeding, abnormal discharge, diarrhea, constipation or UTI symptoms.   OB History   Grav Para Term Preterm Abortions TAB SAB Ect Mult Living   6 1   3 2 1   1       Past Medical History  Diagnosis Date  . Fibroadenoma of breast     LEFT  . LGSIL (low grade squamous intraepithelial dysplasia) 12/2008, 02/2011    C&B WITH LGSIL 02/2011  . IBS (irritable bowel syndrome)   . Elevated prolactin level     30 range 12/2008, normal X multiple repeats, most recent 18 04/2011  . STD (sexually transmitted disease)     History of Chlamydia    Past Surgical History  Procedure Laterality Date  . Induced abortion  1478,2956    x2  . Adenoidectomy      ASA CHILD  . Colposcopy    . Dilation and curettage of uterus      Family History  Problem Relation Age of Onset  . Other Brother     TB    History  Substance Use Topics  . Smoking status: Former Smoker -- 3 years    Types: Cigarettes    Quit date: 04/12/2010  . Smokeless tobacco: Never Used     Comment: Maybe 1 cigarette every 2 days  . Alcohol Use: Yes     Comment: Occasional    Allergies:  Allergies  Allergen Reactions  . Banana     No prescriptions prior to admission    Review of Systems  Constitutional: Negative for fever,  chills and malaise/fatigue.  Gastrointestinal: Positive for nausea, vomiting and abdominal pain. Negative for diarrhea and constipation.  Genitourinary: Negative for dysuria, urgency and frequency.  Neurological: Positive for headaches. Negative for dizziness and loss of consciousness.   Physical Exam   Blood pressure 112/62, pulse 83, temperature 97.8 F (36.6 C), resp. rate 20, height 5\' 4"  (1.626 m), weight 148 lb 6.4 oz (67.314 kg), last menstrual period 05/24/2012, SpO2 100.00%.  Physical Exam  Constitutional: She is oriented to person, place, and time. She appears well-developed and well-nourished. No distress.  HENT:  Head: Normocephalic and atraumatic.  Cardiovascular: Normal rate, regular rhythm and normal heart sounds.   Respiratory: Effort normal and breath sounds normal. No respiratory distress.  GI: Soft. Bowel sounds are normal. She exhibits no distension and no mass. There is tenderness (mild tenderness to palpation of the LLQ). There is no rebound and no guarding.  Genitourinary: Vagina normal. Uterus is enlarged. Uterus is not tender. Cervix exhibits discharge (moderate amount of white discharge noted at the cervical os and in the vaginal vault). Cervix exhibits no motion tenderness and no friability. Right adnexum displays no mass and  no tenderness. Left adnexum displays no mass and no tenderness.  Neurological: She is alert and oriented to person, place, and time.  Skin: Skin is warm and dry. No erythema.  Psychiatric: She has a normal mood and affect.   Results for orders placed during the hospital encounter of 07/13/12 (from the past 24 hour(s))  URINALYSIS, ROUTINE W REFLEX MICROSCOPIC     Status: Abnormal   Collection Time    07/13/12 10:01 PM      Result Value Range   Color, Urine YELLOW  YELLOW   APPearance CLEAR  CLEAR   Specific Gravity, Urine >1.030 (*) 1.005 - 1.030   pH 6.0  5.0 - 8.0   Glucose, UA NEGATIVE  NEGATIVE mg/dL   Hgb urine dipstick NEGATIVE   NEGATIVE   Bilirubin Urine NEGATIVE  NEGATIVE   Ketones, ur >80 (*) NEGATIVE mg/dL   Protein, ur 30 (*) NEGATIVE mg/dL   Urobilinogen, UA 0.2  0.0 - 1.0 mg/dL   Nitrite NEGATIVE  NEGATIVE   Leukocytes, UA NEGATIVE  NEGATIVE  URINE MICROSCOPIC-ADD ON     Status: Abnormal   Collection Time    07/13/12 10:01 PM      Result Value Range   Squamous Epithelial / LPF MANY (*) RARE   WBC, UA 0-2  <3 WBC/hpf   RBC / HPF 0-2  <3 RBC/hpf   Bacteria, UA FEW (*) RARE   Urine-Other MUCOUS PRESENT    ABO/RH     Status: None   Collection Time    07/13/12 10:46 PM      Result Value Range   ABO/RH(D) O POS    HCG, QUANTITATIVE, PREGNANCY     Status: Abnormal   Collection Time    07/13/12 10:47 PM      Result Value Range   hCG, Beta Chain, Quant, S 42171 (*) <5 mIU/mL  CBC     Status: Abnormal   Collection Time    07/13/12 10:47 PM      Result Value Range   WBC 11.4 (*) 4.0 - 10.5 K/uL   RBC 4.97  3.87 - 5.11 MIL/uL   Hemoglobin 13.8  12.0 - 15.0 g/dL   HCT 40.9  81.1 - 91.4 %   MCV 82.3  78.0 - 100.0 fL   MCH 27.8  26.0 - 34.0 pg   MCHC 33.7  30.0 - 36.0 g/dL   RDW 78.2  95.6 - 21.3 %   Platelets 297  150 - 400 K/uL  WET PREP, GENITAL     Status: Abnormal   Collection Time    07/13/12 10:54 PM      Result Value Range   Yeast Wet Prep HPF POC NONE SEEN  NONE SEEN   Trich, Wet Prep NONE SEEN  NONE SEEN   Clue Cells Wet Prep HPF POC MODERATE (*) NONE SEEN   WBC, Wet Prep HPF POC FEW (*) NONE SEEN   *RADIOLOGY REPORT*  Clinical Data: Pregnant, left lower quadrant pain  OBSTETRIC <14 WK [place] AND TRANSVAGINAL OB US  Technique: Both transabdominal and transvaginal ultrasound  examinations were performed for complete evaluation of the  gestation as well as the maternal uterus, adnexal regions, and  pelvic cul-de-sac. Transvaginal technique was performed to assess  early pregnancy.  Comparison: None.  Intrauterine gestational sac: Visualized/normal in shape.  Yolk sac: Identified   Embryo: Identified  Cardiac Activity: Identified  Heart Rate: 122 bpm  CRL: 9 mm 7 w 0 d [place] EDC: 03/01/2013  Maternal uterus/adnexae:  Small subchorionic hemorrhage measuring 2.6 x 1.1 cm. Normal  sonographic appearance to the ovaries with a corpus luteal cyst on  the right. No free fluid.  IMPRESSION:  Single intrauterine gestation with cardiac activity documented.  Estimated age of 7 weeks 0 days by crown-rump length.  Small subchorionic hemorrhage.  Original Report Authenticated By: Jearld Lesch, M.D.  MAU Course  Procedures None  MDM Discussed patient with Dr. Lily Peer. He agrees with plan. Follow-up in office as scheduled.  Discussed lab results and [place] with Dr. Lily Peer. 1 L IV LR for hydration with zofran 4 mg IV. Rx for Clindamycin for BV as recent treatment with Flagyl proved ineffective.   Assessment and Plan  A: SIUP at 7w 0d with cardiac activity Bacterial vaginosis Nausea and vomiting in pregnancy Abdominal pain in pregnancy  P: Discharge home Rx for Clindamycin, phenergan and zofran sent to pharmacy Hypermemesis diet on AVS Encouraged increased PO hydration as tolerated Keep scheduled appointment this week with [facility] Return to MAU as needed or if her condition were to change or worsen  Freddi Starr, PA-C  07/13/2012, 10:40 PM

## 2012-07-13 NOTE — MAU Note (Signed)
Cramping LL abdomen for couple hours. Vomiting. No bleeding

## 2012-07-14 ENCOUNTER — Encounter (HOSPITAL_COMMUNITY): Payer: Self-pay

## 2012-07-14 MED ORDER — ONDANSETRON HCL 4 MG PO TABS: 4.0000 mg | ORAL_TABLET | Freq: Four times a day (QID) | ORAL | Status: DC

## 2012-07-14 MED ORDER — CLINDAMYCIN HCL 300 MG PO CAPS: 300.0000 mg | ORAL_CAPSULE | Freq: Two times a day (BID) | ORAL | Status: DC

## 2012-07-14 MED ORDER — PROMETHAZINE HCL 25 MG PO TABS: 25.0000 mg | ORAL_TABLET | Freq: Four times a day (QID) | ORAL | Status: DC | PRN

## 2012-07-17 ENCOUNTER — Ambulatory Visit (INDEPENDENT_AMBULATORY_CARE_PROVIDER_SITE_OTHER): Payer: 59

## 2012-07-17 ENCOUNTER — Ambulatory Visit (INDEPENDENT_AMBULATORY_CARE_PROVIDER_SITE_OTHER): Payer: 59 | Admitting: Gynecology

## 2012-07-17 ENCOUNTER — Encounter: Payer: Self-pay | Admitting: Gynecology

## 2012-07-17 DIAGNOSIS — N912 Amenorrhea, unspecified: Secondary | ICD-10-CM

## 2012-07-17 DIAGNOSIS — O9989 Other specified diseases and conditions complicating pregnancy, childbirth and the puerperium: Secondary | ICD-10-CM

## 2012-07-17 DIAGNOSIS — O468X9 Other antepartum hemorrhage, unspecified trimester: Secondary | ICD-10-CM

## 2012-07-17 DIAGNOSIS — N831 Corpus luteum cyst of ovary, unspecified side: Secondary | ICD-10-CM

## 2012-07-17 DIAGNOSIS — O418X10 Other specified disorders of amniotic fluid and membranes, first trimester, not applicable or unspecified: Secondary | ICD-10-CM

## 2012-07-17 LAB — US OB TRANSVAGINAL

## 2012-07-17 NOTE — Patient Instructions (Signed)
Make an appointment to see an obstetrician in town for continued obstetrical care.  [facility]   office below [facility]  across the street from [facility] on [facility]  across from the club exercise [place]

## 2012-07-17 NOTE — Progress Notes (Signed)
Patient presents for ultrasound and followup.  Had evaluation at [facility] ER for abdominal pain 07/13/2012. Had ultrasound that showed a small subchorionic hemorrhage area, she's done well since then with no bleeding or cramping. Overall feeling well.  Is on a prenatal vitamin.  Ultrasound shows viable single IUP consistent with dates at 7 weeks. Prior subchorionic hemorrhage not visualized.  Assessment and plan: IUP, viable at 7 weeks. Patient will make an appointment to see an obstetrical group in town for continued obstetrical care. Names provided. Patient knows importance of calling and making an appointment ASAP.

## 2012-07-23 ENCOUNTER — Telehealth: Payer: Self-pay | Admitting: *Deleted

## 2012-07-23 MED ORDER — DOXYLAMINE-PYRIDOXINE 10-10 MG PO TBEC: 2.0000 | DELAYED_RELEASE_TABLET | Freq: Every evening | ORAL | Status: DC | PRN

## 2012-07-23 NOTE — Telephone Encounter (Signed)
Disclaimer: no medication proven safe during pregnancy. Zofran 4 mg tab #20 one by mouth Q8 hours when necessary nausea. No refill.

## 2012-07-23 NOTE — Telephone Encounter (Signed)
Pt informed with the below note. 

## 2012-07-23 NOTE — Telephone Encounter (Signed)
Pt is calling requesting a Rx for Zofran to help with nausea ,she will be going to [facility] once records are sent pt will have appointment scheduled. She was last seen on 07/17/12.  Please advise

## 2012-07-23 NOTE — Telephone Encounter (Signed)
Diclegis is the recommended medication for nausea and pregnancy. It is 2 tablets at bedtime.

## 2012-07-23 NOTE — Telephone Encounter (Signed)
Pt called back and said the Rx for Diclegis is $ 169.00 too expensive for her. Pt asked if another Rx could be given?

## 2012-07-24 MED ORDER — ONDANSETRON HCL 4 MG PO TABS: 4.0000 mg | ORAL_TABLET | Freq: Three times a day (TID) | ORAL | Status: DC | PRN

## 2012-07-24 NOTE — Telephone Encounter (Signed)
Pt informed with the below note. rx sent 

## 2012-07-24 NOTE — Addendum Note (Signed)
Addended by: Aura Camps on: 07/24/2012 08:34 AM   Modules accepted: Orders

## 2012-07-31 ENCOUNTER — Encounter: Payer: Self-pay | Admitting: Gynecology

## 2012-07-31 ENCOUNTER — Ambulatory Visit (INDEPENDENT_AMBULATORY_CARE_PROVIDER_SITE_OTHER): Payer: 59 | Admitting: Gynecology

## 2012-07-31 DIAGNOSIS — N949 Unspecified condition associated with female genital organs and menstrual cycle: Secondary | ICD-10-CM

## 2012-07-31 DIAGNOSIS — R102 Pelvic and perineal pain: Secondary | ICD-10-CM

## 2012-07-31 NOTE — Progress Notes (Signed)
The patient presents approximately 9 weeks complaining of lower pelvic cramping. No bleeding or discharge. Prior ultrasound showed viable IUP. Has appointment to see [facility] next week to establish ongoing obstetrical care.  Exam with Kim Asst. Abdomen soft nontender without masses guarding rebound organomegaly. Positive Doptone is auscultated. Pelvic external BUS vagina normal. Cervix long and closed. Uterus soft 9 weeks size midline mobile nontender. Adnexa without masses or tenderness.  Assessment and plan: Ligament pain. Viable IUP. AB precautions reviewed. Keep appointment with [facility] for new OB appointment and ongoing obstetrical care. Call sooner if bleeding or other more acute symptoms.

## 2012-07-31 NOTE — Patient Instructions (Addendum)
Keep your appointment with [facility] group for continued obstetrical care next week.

## 2012-08-08 LAB — OB RESULTS CONSOLE GC/CHLAMYDIA
Chlamydia: NEGATIVE
Gonorrhea: NEGATIVE

## 2012-08-08 LAB — OB RESULTS CONSOLE RPR: RPR: NONREACTIVE

## 2012-08-08 LAB — OB RESULTS CONSOLE HIV ANTIBODY (ROUTINE TESTING): HIV: NONREACTIVE

## 2012-08-08 LAB — OB RESULTS CONSOLE RUBELLA ANTIBODY, IGM: Rubella: IMMUNE

## 2012-11-07 ENCOUNTER — Encounter (HOSPITAL_COMMUNITY): Payer: Self-pay | Admitting: *Deleted

## 2012-11-07 ENCOUNTER — Inpatient Hospital Stay (HOSPITAL_COMMUNITY)
Admission: AD | Admit: 2012-11-07 | Discharge: 2012-11-07 | Disposition: A | Discharge status: Home / Self Care | Payer: 59 | Source: Ambulatory Visit | Attending: Obstetrics and Gynecology | Admitting: Obstetrics and Gynecology

## 2012-11-07 DIAGNOSIS — O26899 Other specified pregnancy related conditions, unspecified trimester: Secondary | ICD-10-CM

## 2012-11-07 DIAGNOSIS — O99891 Other specified diseases and conditions complicating pregnancy: Secondary | ICD-10-CM | POA: Insufficient documentation

## 2012-11-07 DIAGNOSIS — R109 Unspecified abdominal pain: Secondary | ICD-10-CM | POA: Insufficient documentation

## 2012-11-07 LAB — URINALYSIS, ROUTINE W REFLEX MICROSCOPIC
Glucose, UA: NEGATIVE mg/dL
Leukocytes, UA: NEGATIVE
Nitrite: NEGATIVE
Protein, ur: NEGATIVE mg/dL

## 2012-11-07 NOTE — MAU Note (Signed)
C/o abdominal cramping since last night @ midnight; then worsened this AM; c/o vaginal leaking also with achy back pain;

## 2012-11-07 NOTE — MAU Provider Note (Signed)
History     CSN: [id number]  Arrival date and time: 11/07/12 1223   None     Chief Complaint  Patient presents with  . Abdominal Cramping   HPI [name] is 27 y.o. [phone] [redacted]w[redacted]d weeks presenting with abdominal cramping that began last night.  She was working from home when they became stronger, like menstrual cramping at 11am.  Also noticed a "wet spot in my panties" at 11:30.  Denies hx of preterm labor.  States she is having less pain now and no further leaking.  She is a patient of Dr. Eligha Bridegroom. Called the office and told to come in.     Past Medical History  Diagnosis Date  . Fibroadenoma of breast     LEFT  . LGSIL (low grade squamous intraepithelial dysplasia) 12/2008, 02/2011    C&B WITH LGSIL 02/2011  . IBS (irritable bowel syndrome)   . Elevated prolactin level     30 range 12/2008, normal X multiple repeats, most recent 18 04/2011  . STD (sexually transmitted disease)     History of Chlamydia  . Abnormal Pap smear     Past Surgical History  Procedure Laterality Date  . Induced abortion  8469,6295    x2  . Adenoidectomy      ASA CHILD  . Colposcopy    . Dilation and curettage of uterus      Family History  Problem Relation Age of Onset  . Other Brother     TB    History  Substance Use Topics  . Smoking status: Former Smoker -- 3 years    Types: Cigarettes    Quit date: 04/12/2010  . Smokeless tobacco: Never Used     Comment: Maybe 1 cigarette every 2 days  . Alcohol Use: Yes     Comment: Occasional    Allergies:  Allergies  Allergen Reactions  . Banana Itching    Prescriptions prior to admission  Medication Sig Dispense Refill  . acetaminophen (TYLENOL) 325 MG tablet Take 325 mg by mouth daily as needed for pain.      . Prenatal Vit-Fe Fumarate-FA (PRENATAL MULTIVITAMIN) TABS Take 1 tablet by mouth daily at 12 noon.        Review of Systems  Constitutional: Negative for fever and chills.  Gastrointestinal: Positive for  abdominal pain (cramping). Negative for nausea and vomiting.  Genitourinary: Negative for dysuria, urgency and frequency.       ? Leaking of fluid.  Neg for vaginal bleeding.  Neurological: Negative for headaches.   Physical Exam   Blood pressure 111/56, pulse 79, temperature 98.2 F (36.8 C), temperature source Oral, resp. rate 16, height 5\' 4"  (1.626 m), weight 156 lb (70.761 kg), last menstrual period 05/24/2012.  Physical Exam  Constitutional: She is oriented to person, place, and time. She appears well-developed and well-nourished. No distress.  HENT:  Head: Normocephalic.  Neck: Normal range of motion.  Cardiovascular: Normal rate.   Respiratory: Effort normal.  GI: Soft. She exhibits no distension and no mass. There is no tenderness. There is no rebound and no guarding.  Genitourinary: There is no rash, tenderness or lesion on the right labia. There is no rash, tenderness or lesion on the left labia. Uterus is enlarged. Uterus is not tender. Cervix exhibits no discharge and no friability. No erythema, tenderness or bleeding around the vagina. Vaginal discharge (small amount of white discharge without odor.  Neg for pooling of fluid) found.  Cervix is closed  Neurological:  She is alert and oriented to person, place, and time.  Skin: Skin is warm and dry.  Psychiatric: She has a normal mood and affect. Her behavior is normal.   Results for orders placed during the hospital encounter of 11/07/12 (from the past 24 hour(s))  URINALYSIS, ROUTINE W REFLEX MICROSCOPIC     Status: Abnormal   Collection Time    11/07/12 12:35 PM      Result Value Range   Color, Urine YELLOW  YELLOW   APPearance HAZY (*) CLEAR   Specific Gravity, Urine 1.020  1.005 - 1.030   pH 8.5 (*) 5.0 - 8.0   Glucose, UA NEGATIVE  NEGATIVE mg/dL   Hgb urine dipstick NEGATIVE  NEGATIVE   Bilirubin Urine NEGATIVE  NEGATIVE   Ketones, ur NEGATIVE  NEGATIVE mg/dL   Protein, ur NEGATIVE  NEGATIVE mg/dL    Urobilinogen, UA 0.2  0.0 - 1.0 mg/dL   Nitrite NEGATIVE  NEGATIVE   Leukocytes, UA NEGATIVE  NEGATIVE   FMS baseline 140 with accels of 10X10, no decels, no contractions FERN -NEGATIVE MAU Course  Procedures  MDM 13:10  Reported MSE to Dr. Jackelyn Knife.   Order to perform pelvic exam.      Assessment and Plan  A:  Cramping in second trimester       NEgative Fern test  P: Instructed patient to stay well hydrated     Tylenol prn for discomfort    Report any vaginal bleeding, leaking of fluid or decreased fetal movement to MD    Keep scheduled appointment  KEY,EVE M 11/07/2012, 1:55 PM

## 2013-01-02 ENCOUNTER — Emergency Department (INDEPENDENT_AMBULATORY_CARE_PROVIDER_SITE_OTHER)
Admission: EM | Admit: 2013-01-02 | Discharge: 2013-01-02 | Disposition: A | Discharge status: Home / Self Care | Payer: 59 | Source: Home / Self Care | Attending: Family Medicine | Admitting: Family Medicine

## 2013-01-02 ENCOUNTER — Encounter (HOSPITAL_COMMUNITY): Payer: Self-pay | Admitting: Emergency Medicine

## 2013-01-02 DIAGNOSIS — J329 Chronic sinusitis, unspecified: Secondary | ICD-10-CM

## 2013-01-02 MED ORDER — AMOXICILLIN 500 MG PO CAPS: 500.0000 mg | ORAL_CAPSULE | Freq: Three times a day (TID) | ORAL | Status: DC

## 2013-01-02 NOTE — ED Provider Notes (Signed)
Medical screening examination/treatment/procedure(s) were performed by a resident physician or non-physician practitioner and as the supervising physician I was immediately available for consultation/collaboration.  Kalayla Shadden, MD   Y[facility] S Corby Villasenor, MD 01/02/13 1358 

## 2013-01-02 NOTE — ED Notes (Signed)
C/o cold flu like symptoms which started yesterday.  States symptoms are chills, fever, headache, sore achy body, nasal congestion with green yellow mucous, and yellowish mucous vomiting.

## 2013-01-02 NOTE — ED Provider Notes (Signed)
CSN: [id number]     Arrival date & time 01/02/13  8295 History   First MD Initiated Contact with Patient 01/02/13 1019     Chief Complaint  Patient presents with  . URI   (Consider location/radiation/quality/duration/timing/severity/associated sxs/prior Treatment) Patient is a 27 y.o. female presenting with URI. The history is provided by the patient. No language interpreter was used.  URI Presenting symptoms: congestion and cough   Severity:  Moderate Onset quality:  Gradual Duration:  2 days Timing:  Constant Progression:  Worsening Chronicity:  New Relieved by:  Nothing Worsened by:  Nothing tried Ineffective treatments:  None tried Associated symptoms: myalgias   Pt complains of cough and congestion.  Pt reports she has had a fever and congestion  Past Medical History  Diagnosis Date  . Fibroadenoma of breast     LEFT  . LGSIL (low grade squamous intraepithelial dysplasia) 12/2008, 02/2011    C&B WITH LGSIL 02/2011  . IBS (irritable bowel syndrome)   . Elevated prolactin level     30 range 12/2008, normal X multiple repeats, most recent 18 04/2011  . STD (sexually transmitted disease)     History of Chlamydia  . Abnormal Pap smear    Past Surgical History  Procedure Laterality Date  . Induced abortion  6213,0865    x2  . Adenoidectomy      ASA CHILD  . Colposcopy    . Dilation and curettage of uterus     Family History  Problem Relation Age of Onset  . Other Brother     TB   History  Substance Use Topics  . Smoking status: Former Smoker -- 3 years    Types: Cigarettes    Quit date: 04/12/2010  . Smokeless tobacco: Never Used     Comment: Maybe 1 cigarette every 2 days  . Alcohol Use: Yes     Comment: Occasional   OB History   Grav Para Term Preterm Abortions TAB SAB Ect Mult Living   5 1 1  3 2 1   1      Review of Systems  HENT: Positive for congestion.   Respiratory: Positive for cough.   Musculoskeletal: Positive for myalgias.  All other  systems reviewed and are negative.    Allergies  Banana  Home Medications   Current Outpatient Rx  Name  Route  Sig  Dispense  Refill  . acetaminophen (TYLENOL) 325 MG tablet   Oral   Take 325 mg by mouth daily as needed for pain.         . Prenatal Vit-Fe Fumarate-FA (PRENATAL MULTIVITAMIN) TABS   Oral   Take 1 tablet by mouth daily at 12 noon.          BP 117/60  Pulse 97  Temp(Src) 98 F (36.7 C)  Resp 18  SpO2 100%  LMP 05/24/2012 Physical Exam  Nursing note and vitals reviewed. Constitutional: She appears well-nourished.  HENT:  Head: Normocephalic.  Right Ear: External ear normal.  Left Ear: External ear normal.  Nose: Nose normal.  Mouth/Throat: Oropharynx is clear and moist.  Sinus tenderness  Neck: Normal range of motion. Neck supple.  Cardiovascular: Normal rate.   Pulmonary/Chest: Effort normal.  Abdominal: Soft.  Neurological: She is alert.  Skin: Skin is warm.    ED Course  Procedures (including critical care time) Labs Review Labs Reviewed - No data to display Imaging Review No results found.  MDM   1. Sinusitis    amoxicillian  Follow up with your physicain as scheduled for rechecl    Elson Areas, PA-C 01/02/13 1045

## 2013-02-13 ENCOUNTER — Inpatient Hospital Stay (HOSPITAL_COMMUNITY)
Admission: AD | Admit: 2013-02-13 | Discharge: 2013-02-13 | Disposition: A | Discharge status: Home / Self Care | Payer: 59 | Source: Ambulatory Visit | Attending: Obstetrics and Gynecology | Admitting: Obstetrics and Gynecology

## 2013-02-13 ENCOUNTER — Encounter (HOSPITAL_COMMUNITY): Payer: Self-pay | Admitting: *Deleted

## 2013-02-13 DIAGNOSIS — O26893 Other specified pregnancy related conditions, third trimester: Secondary | ICD-10-CM

## 2013-02-13 DIAGNOSIS — R0602 Shortness of breath: Secondary | ICD-10-CM | POA: Insufficient documentation

## 2013-02-13 DIAGNOSIS — O99891 Other specified diseases and conditions complicating pregnancy: Secondary | ICD-10-CM | POA: Insufficient documentation

## 2013-02-13 DIAGNOSIS — O9989 Other specified diseases and conditions complicating pregnancy, childbirth and the puerperium: Secondary | ICD-10-CM

## 2013-02-13 LAB — URINALYSIS, ROUTINE W REFLEX MICROSCOPIC
Ketones, ur: 15 mg/dL — AB
Nitrite: NEGATIVE
Protein, ur: NEGATIVE mg/dL
Urobilinogen, UA: 0.2 mg/dL (ref 0.0–1.0)
pH: 6.5 (ref 5.0–8.0)

## 2013-02-13 LAB — URINE MICROSCOPIC-ADD ON

## 2013-02-13 NOTE — MAU Note (Signed)
Patient presents to MAU with c/o Shob; sats 100% room air. States feels Shob no matter the activity. Denies any pain at this time. Denies LOF, bleeding, nor contractions at this time. Reports good fetal movement. States having clear watery discharge.

## 2013-02-13 NOTE — MAU Provider Note (Signed)
History     CSN: [id number]  Arrival date and time: 02/13/13 4401   First Provider Initiated Contact with Patient 02/13/13 2029      No chief complaint on file.  HPI [name] is a 27 y.o. female [phone] at [redacted]w[redacted]d who presents with SOB that has been going on for one week; patient feels like its getting worse. She reports good fetal movement, denies LOF, vaginal bleeding, vaginal itching/burning, urinary symptoms, h/a, dizziness, n/v, or fever/chills.  At times the patient feels heart palpitations when she lays down.   OB History   Grav Para Term Preterm Abortions TAB SAB Ect Mult Living   5 1 1  3 2 1   1       Past Medical History  Diagnosis Date  . Fibroadenoma of breast     LEFT  . LGSIL (low grade squamous intraepithelial dysplasia) 12/2008, 02/2011    C&B WITH LGSIL 02/2011  . IBS (irritable bowel syndrome)   . Elevated prolactin level     30 range 12/2008, normal X multiple repeats, most recent 18 04/2011  . STD (sexually transmitted disease)     History of Chlamydia  . Abnormal Pap smear     Past Surgical History  Procedure Laterality Date  . Induced abortion  6440,3474    x2  . Adenoidectomy      ASA CHILD  . Colposcopy    . Dilation and curettage of uterus      Family History  Problem Relation Age of Onset  . Other Brother     TB    History  Substance Use Topics  . Smoking status: Former Smoker -- 3 years    Types: Cigarettes    Quit date: 04/12/2010  . Smokeless tobacco: Never Used     Comment: Maybe 1 cigarette every 2 days  . Alcohol Use: Yes     Comment: Occasional    Allergies:  Allergies  Allergen Reactions  . Banana Itching    Prescriptions prior to admission  Medication Sig Dispense Refill  . acetaminophen (TYLENOL) 500 MG tablet Take 500 mg by mouth every 6 (six) hours as needed for pain.      . Prenatal Vit-Fe Fumarate-FA (PRENATAL MULTIVITAMIN) TABS Take 1 tablet by mouth daily at 12 noon.       Results for orders  placed during the hospital encounter of 02/13/13 (from the past 24 hour(s))  URINALYSIS, ROUTINE W REFLEX MICROSCOPIC     Status: Abnormal   Collection Time    02/13/13  7:15 PM      Result Value Range   Color, Urine YELLOW  YELLOW   APPearance HAZY (*) CLEAR   Specific Gravity, Urine 1.025  1.005 - 1.030   pH 6.5  5.0 - 8.0   Glucose, UA NEGATIVE  NEGATIVE mg/dL   Hgb urine dipstick TRACE (*) NEGATIVE   Bilirubin Urine NEGATIVE  NEGATIVE   Ketones, ur 15 (*) NEGATIVE mg/dL   Protein, ur NEGATIVE  NEGATIVE mg/dL   Urobilinogen, UA 0.2  0.0 - 1.0 mg/dL   Nitrite NEGATIVE  NEGATIVE   Leukocytes, UA TRACE (*) NEGATIVE  URINE MICROSCOPIC-ADD ON     Status: None   Collection Time    02/13/13  7:15 PM      Result Value Range   Squamous Epithelial / LPF RARE  RARE   WBC, UA 0-2  <3 WBC/hpf   RBC / HPF 0-2  <3 RBC/hpf   Bacteria, UA RARE  RARE   Urine-Other CA OXALATE CRYSTALS      Review of Systems  Constitutional: Negative for fever and chills.  Cardiovascular: Positive for palpitations. Negative for chest pain.  Gastrointestinal: Positive for vomiting. Negative for heartburn, nausea, abdominal pain, diarrhea and constipation.  Genitourinary: Negative for dysuria, urgency, frequency and hematuria.       + vaginal discharge; white discharge  No vaginal bleeding. No dysuria.   Neurological: Negative for dizziness.   Physical Exam   Blood pressure 112/60, pulse 103, temperature 98.2 F (36.8 C), temperature source Oral, resp. rate 18, height 5\' 4"  (1.626 m), weight 78.744 kg (173 lb 9.6 oz), last menstrual period 05/24/2012, SpO2 100.00%.  Physical Exam  Constitutional: She is oriented to person, place, and time. She appears well-developed and well-nourished. No distress.  HENT:  Head: Normocephalic.  Eyes: Pupils are equal, round, and reactive to light.  Neck: Neck supple.  Cardiovascular: Normal rate and normal heart sounds.   Respiratory: Effort normal and breath sounds  normal. No respiratory distress.  Neurological: She is alert and oriented to person, place, and time.  Skin: Skin is warm. She is not diaphoretic.   Fetal Tracing: Baseline: 130 bpm  Variability: Moderate  Accelerations: 15x15 Decelerations: None  Toco: no contractions    MAU Course  Procedures None  MDM UA EKG Dr. Senaida Ores in a delivery at this time; if no concerns ok to send the patient home.   Assessment and Plan   A: Shortness of breath in pregnancy  Normal EKG Pulse oximetry 100% on room air  P: Discharge home Follow up with Dr.Meisinger as scheduled Return to MAU as needed, if symptoms worsen.  Kimberleigh Mehan IRENE 02/13/2013, 8:29 PM

## 2013-02-27 ENCOUNTER — Encounter (HOSPITAL_COMMUNITY): Payer: Self-pay | Admitting: *Deleted

## 2013-02-27 ENCOUNTER — Telehealth (HOSPITAL_COMMUNITY): Payer: Self-pay | Admitting: *Deleted

## 2013-02-27 NOTE — Telephone Encounter (Signed)
Preadmission screen  

## 2013-03-02 ENCOUNTER — Encounter (HOSPITAL_COMMUNITY): Payer: Self-pay | Admitting: *Deleted

## 2013-03-02 ENCOUNTER — Inpatient Hospital Stay (HOSPITAL_COMMUNITY)
Admission: AD | Admit: 2013-03-02 | Discharge: 2013-03-02 | Disposition: A | Discharge status: Home / Self Care | Payer: 59 | Source: Ambulatory Visit | Attending: Obstetrics and Gynecology | Admitting: Obstetrics and Gynecology

## 2013-03-02 DIAGNOSIS — O479 False labor, unspecified: Secondary | ICD-10-CM | POA: Insufficient documentation

## 2013-03-02 NOTE — MAU Note (Signed)
Pt having contractions every .

## 2013-03-02 NOTE — MAU Note (Signed)
Dr. Henley notified of pt, orders rec'd. 

## 2013-03-04 ENCOUNTER — Inpatient Hospital Stay (HOSPITAL_COMMUNITY)
Admission: AD | Admit: 2013-03-04 | Discharge: 2013-03-04 | Disposition: A | Discharge status: Home / Self Care | Payer: 59 | Source: Ambulatory Visit | Attending: Obstetrics and Gynecology | Admitting: Obstetrics and Gynecology

## 2013-03-04 ENCOUNTER — Encounter (HOSPITAL_COMMUNITY): Payer: Self-pay

## 2013-03-04 DIAGNOSIS — O479 False labor, unspecified: Secondary | ICD-10-CM | POA: Insufficient documentation

## 2013-03-04 NOTE — MAU Note (Signed)
Patient states she is having frequent contractions. Denies bleeding but has a "slimy" vaginal discharge. Reports good fetal movement.

## 2013-03-04 NOTE — MAU Note (Signed)
Patient had a negative PCR in 2012

## 2013-03-06 ENCOUNTER — Inpatient Hospital Stay (HOSPITAL_COMMUNITY): Admission: RE | Admit: 2013-03-06 | Payer: 59 | Source: Ambulatory Visit

## 2013-03-06 ENCOUNTER — Inpatient Hospital Stay (HOSPITAL_COMMUNITY)
Admission: AD | Admit: 2013-03-06 | Discharge: 2013-03-09 | DRG: 766 | Disposition: A | Discharge status: Home / Self Care | Payer: 59 | Source: Ambulatory Visit | Attending: Obstetrics and Gynecology | Admitting: Obstetrics and Gynecology

## 2013-03-06 ENCOUNTER — Inpatient Hospital Stay (HOSPITAL_COMMUNITY): Payer: 59 | Admitting: Anesthesiology

## 2013-03-06 ENCOUNTER — Encounter (HOSPITAL_COMMUNITY): Payer: 59 | Admitting: Anesthesiology

## 2013-03-06 ENCOUNTER — Encounter (HOSPITAL_COMMUNITY)
Admission: AD | Disposition: A | Discharge status: Home / Self Care | Payer: Self-pay | Source: Ambulatory Visit | Attending: Obstetrics and Gynecology

## 2013-03-06 ENCOUNTER — Encounter (HOSPITAL_COMMUNITY): Payer: Self-pay | Admitting: *Deleted

## 2013-03-06 DIAGNOSIS — Z22330 Carrier of Group B streptococcus: Secondary | ICD-10-CM

## 2013-03-06 DIAGNOSIS — Z98891 History of uterine scar from previous surgery: Secondary | ICD-10-CM

## 2013-03-06 DIAGNOSIS — O99892 Other specified diseases and conditions complicating childbirth: Secondary | ICD-10-CM | POA: Diagnosis present

## 2013-03-06 LAB — CBC
Hemoglobin: 11.3 g/dL — ABNORMAL LOW (ref 12.0–15.0)
MCH: 27.9 pg (ref 26.0–34.0)
MCHC: 33.4 g/dL (ref 30.0–36.0)
MCV: 83.5 fL (ref 78.0–100.0)
Platelets: 229 10*3/uL (ref 150–400)
RBC: 4.05 MIL/uL (ref 3.87–5.11)
WBC: 8.5 10*3/uL (ref 4.0–10.5)

## 2013-03-06 LAB — RPR: RPR Ser Ql: NONREACTIVE

## 2013-03-06 SURGERY — Surgical Case
Anesthesia: Epidural | Site: Abdomen | Wound class: Clean Contaminated

## 2013-03-06 MED ORDER — MORPHINE SULFATE 0.5 MG/ML IJ SOLN
INTRAMUSCULAR | Status: AC
  Filled 2013-03-06: qty 10

## 2013-03-06 MED ORDER — PRENATAL MULTIVITAMIN CH: 1.0000 | ORAL_TABLET | Freq: Every day | ORAL | Status: DC

## 2013-03-06 MED ORDER — CEFAZOLIN SODIUM-DEXTROSE 2-3 GM-% IV SOLR
INTRAVENOUS | Status: DC | PRN
  Administered 2013-03-06: 2 g via INTRAVENOUS

## 2013-03-06 MED ORDER — NALOXONE HCL 1 MG/ML IJ SOLN: 1.0000 ug/kg/h | INTRAVENOUS | Status: DC | PRN

## 2013-03-06 MED ORDER — WITCH HAZEL-GLYCERIN EX PADS: 1.0000 "application " | MEDICATED_PAD | CUTANEOUS | Status: DC | PRN

## 2013-03-06 MED ORDER — OXYCODONE-ACETAMINOPHEN 5-325 MG PO TABS: 1.0000 | ORAL_TABLET | ORAL | Status: DC | PRN

## 2013-03-06 MED ORDER — PHENYLEPHRINE 40 MCG/ML (10ML) SYRINGE FOR IV PUSH (FOR BLOOD PRESSURE SUPPORT)
PREFILLED_SYRINGE | INTRAVENOUS | Status: AC
  Filled 2013-03-06: qty 20

## 2013-03-06 MED ORDER — DIBUCAINE 1 % RE OINT: 1.0000 "application " | TOPICAL_OINTMENT | RECTAL | Status: DC | PRN

## 2013-03-06 MED ORDER — METOCLOPRAMIDE HCL 5 MG/ML IJ SOLN: 10.0000 mg | Freq: Three times a day (TID) | INTRAMUSCULAR | Status: DC | PRN

## 2013-03-06 MED ORDER — EPHEDRINE 5 MG/ML INJ: 10.0000 mg | INTRAVENOUS | Status: DC | PRN

## 2013-03-06 MED ORDER — ONDANSETRON HCL 4 MG/2ML IJ SOLN: 4.0000 mg | INTRAMUSCULAR | Status: DC | PRN

## 2013-03-06 MED ORDER — PHENYLEPHRINE HCL 10 MG/ML IJ SOLN
INTRAMUSCULAR | Status: DC | PRN
  Administered 2013-03-06 (×3): 80 ug via INTRAVENOUS
  Administered 2013-03-06 (×2): 40 ug via INTRAVENOUS
  Administered 2013-03-06 (×2): 80 ug via INTRAVENOUS
  Administered 2013-03-06: 40 ug via INTRAVENOUS
  Administered 2013-03-06 (×2): 80 ug via INTRAVENOUS
  Administered 2013-03-06: 40 ug via INTRAVENOUS
  Administered 2013-03-06: 80 ug via INTRAVENOUS
  Administered 2013-03-06: 40 ug via INTRAVENOUS
  Administered 2013-03-06 (×2): 80 ug via INTRAVENOUS

## 2013-03-06 MED ORDER — OXYTOCIN 40 UNITS IN LACTATED RINGERS INFUSION - SIMPLE MED
62.5000 mL/h | INTRAVENOUS | Status: DC
Start: 2013-03-06 — End: 2013-03-06

## 2013-03-06 MED ORDER — NALOXONE HCL 0.4 MG/ML IJ SOLN: 0.4000 mg | INTRAMUSCULAR | Status: DC | PRN

## 2013-03-06 MED ORDER — ONDANSETRON HCL 4 MG/2ML IJ SOLN: 4.0000 mg | Freq: Four times a day (QID) | INTRAMUSCULAR | Status: DC | PRN

## 2013-03-06 MED ORDER — KETOROLAC TROMETHAMINE 30 MG/ML IJ SOLN
30.0000 mg | Freq: Once | INTRAMUSCULAR | Status: AC
  Administered 2013-03-06: 30 mg via INTRAMUSCULAR

## 2013-03-06 MED ORDER — OXYTOCIN BOLUS FROM INFUSION: 500.0000 mL | INTRAVENOUS | Status: DC

## 2013-03-06 MED ORDER — TERBUTALINE SULFATE 1 MG/ML IJ SOLN: 0.2500 mg | Freq: Once | INTRAMUSCULAR | Status: DC | PRN

## 2013-03-06 MED ORDER — IBUPROFEN 600 MG PO TABS
600.0000 mg | ORAL_TABLET | Freq: Four times a day (QID) | ORAL | Status: DC
  Administered 2013-03-07 – 2013-03-09 (×10): 600 mg via ORAL
  Filled 2013-03-06 (×10): qty 1

## 2013-03-06 MED ORDER — LACTATED RINGERS IV SOLN
500.0000 mL | INTRAVENOUS | Status: DC | PRN
  Administered 2013-03-06: 1000 mL via INTRAVENOUS

## 2013-03-06 MED ORDER — FENTANYL 2.5 MCG/ML BUPIVACAINE 1/10 % EPIDURAL INFUSION (WH - ANES)
14.0000 mL/h | INTRAMUSCULAR | Status: DC | PRN
  Administered 2013-03-06 (×2): 14 mL/h via EPIDURAL
  Filled 2013-03-06 (×2): qty 125

## 2013-03-06 MED ORDER — ACETAMINOPHEN 325 MG PO TABS: 650.0000 mg | ORAL_TABLET | ORAL | Status: DC | PRN

## 2013-03-06 MED ORDER — KETOROLAC TROMETHAMINE 30 MG/ML IJ SOLN
INTRAMUSCULAR | Status: AC
  Filled 2013-03-06: qty 1

## 2013-03-06 MED ORDER — LANOLIN HYDROUS EX OINT: 1.0000 "application " | TOPICAL_OINTMENT | CUTANEOUS | Status: DC | PRN

## 2013-03-06 MED ORDER — PHENYLEPHRINE 40 MCG/ML (10ML) SYRINGE FOR IV PUSH (FOR BLOOD PRESSURE SUPPORT): 80.0000 ug | PREFILLED_SYRINGE | INTRAVENOUS | Status: DC | PRN

## 2013-03-06 MED ORDER — SIMETHICONE 80 MG PO CHEW
80.0000 mg | CHEWABLE_TABLET | Freq: Three times a day (TID) | ORAL | Status: DC
  Administered 2013-03-06 – 2013-03-09 (×6): 80 mg via ORAL
  Filled 2013-03-06 (×7): qty 1

## 2013-03-06 MED ORDER — DIPHENHYDRAMINE HCL 50 MG/ML IJ SOLN: 12.5000 mg | INTRAMUSCULAR | Status: DC | PRN

## 2013-03-06 MED ORDER — SCOPOLAMINE 1 MG/3DAYS TD PT72: 1.0000 | MEDICATED_PATCH | Freq: Once | TRANSDERMAL | Status: DC

## 2013-03-06 MED ORDER — OXYTOCIN 40 UNITS IN LACTATED RINGERS INFUSION - SIMPLE MED: 62.5000 mL/h | INTRAVENOUS | Status: AC

## 2013-03-06 MED ORDER — OXYTOCIN 40 UNITS IN LACTATED RINGERS INFUSION - SIMPLE MED
62.5000 mL/h | INTRAVENOUS | Status: DC
  Filled 2013-03-06: qty 1000

## 2013-03-06 MED ORDER — PENICILLIN G POTASSIUM 5000000 UNITS IJ SOLR
2.5000 10*6.[IU] | INTRAVENOUS | Status: DC
  Administered 2013-03-06 (×3): 2.5 10*6.[IU] via INTRAVENOUS
  Filled 2013-03-06 (×6): qty 2.5

## 2013-03-06 MED ORDER — MENTHOL 3 MG MT LOZG: 1.0000 | LOZENGE | OROMUCOSAL | Status: DC | PRN

## 2013-03-06 MED ORDER — LIDOCAINE HCL (PF) 1 % IJ SOLN: 30.0000 mL | INTRAMUSCULAR | Status: DC | PRN

## 2013-03-06 MED ORDER — OXYCODONE-ACETAMINOPHEN 5-325 MG PO TABS
1.0000 | ORAL_TABLET | ORAL | Status: DC | PRN
  Administered 2013-03-07: 1 via ORAL
  Administered 2013-03-08 – 2013-03-09 (×6): 2 via ORAL
  Administered 2013-03-09: 1 via ORAL
  Filled 2013-03-06 (×4): qty 2
  Filled 2013-03-06: qty 1
  Filled 2013-03-06 (×2): qty 2
  Filled 2013-03-06: qty 1

## 2013-03-06 MED ORDER — PENICILLIN G POTASSIUM 5000000 UNITS IJ SOLR
5.0000 10*6.[IU] | Freq: Once | INTRAVENOUS | Status: AC
  Administered 2013-03-06: 5 10*6.[IU] via INTRAVENOUS
  Filled 2013-03-06: qty 5

## 2013-03-06 MED ORDER — ZOLPIDEM TARTRATE 5 MG PO TABS: 5.0000 mg | ORAL_TABLET | Freq: Every evening | ORAL | Status: DC | PRN

## 2013-03-06 MED ORDER — LACTATED RINGERS IV SOLN
INTRAVENOUS | Status: DC
  Administered 2013-03-06 – 2013-03-07 (×2): via INTRAVENOUS

## 2013-03-06 MED ORDER — BUTORPHANOL TARTRATE 1 MG/ML IJ SOLN
1.0000 mg | INTRAMUSCULAR | Status: DC | PRN
  Administered 2013-03-06 (×2): 1 mg via INTRAVENOUS
  Filled 2013-03-06 (×2): qty 1

## 2013-03-06 MED ORDER — ONDANSETRON HCL 4 MG/2ML IJ SOLN
INTRAMUSCULAR | Status: DC | PRN
  Administered 2013-03-06: 4 mg via INTRAVENOUS

## 2013-03-06 MED ORDER — MORPHINE SULFATE (PF) 0.5 MG/ML IJ SOLN
INTRAMUSCULAR | Status: DC | PRN
  Administered 2013-03-06: 1 mg via INTRAVENOUS
  Administered 2013-03-06: 4 mg via EPIDURAL

## 2013-03-06 MED ORDER — FLEET ENEMA 7-19 GM/118ML RE ENEM: 1.0000 | ENEMA | RECTAL | Status: DC | PRN

## 2013-03-06 MED ORDER — LACTATED RINGERS IV SOLN
INTRAVENOUS | Status: DC
  Administered 2013-03-06: 125 mL/h via INTRAVENOUS
  Administered 2013-03-06: 02:00:00 via INTRAVENOUS

## 2013-03-06 MED ORDER — LIDOCAINE HCL (PF) 1 % IJ SOLN
30.0000 mL | INTRAMUSCULAR | Status: DC | PRN
  Filled 2013-03-06: qty 30

## 2013-03-06 MED ORDER — NALBUPHINE HCL 10 MG/ML IJ SOLN: 5.0000 mg | INTRAMUSCULAR | Status: DC | PRN

## 2013-03-06 MED ORDER — LACTATED RINGERS IV SOLN
INTRAVENOUS | Status: DC | PRN
  Administered 2013-03-06 (×2): via INTRAVENOUS

## 2013-03-06 MED ORDER — CEFAZOLIN SODIUM-DEXTROSE 2-3 GM-% IV SOLR
INTRAVENOUS | Status: AC
  Filled 2013-03-06: qty 50

## 2013-03-06 MED ORDER — OXYTOCIN 10 UNIT/ML IJ SOLN
INTRAMUSCULAR | Status: AC
  Filled 2013-03-06: qty 4

## 2013-03-06 MED ORDER — MEPERIDINE HCL 25 MG/ML IJ SOLN: 6.2500 mg | INTRAMUSCULAR | Status: DC | PRN

## 2013-03-06 MED ORDER — HYDROMORPHONE HCL PF 1 MG/ML IJ SOLN: 0.2500 mg | INTRAMUSCULAR | Status: DC | PRN

## 2013-03-06 MED ORDER — LIDOCAINE-EPINEPHRINE (PF) 2 %-1:200000 IJ SOLN
INTRAMUSCULAR | Status: AC
  Filled 2013-03-06: qty 20

## 2013-03-06 MED ORDER — CITRIC ACID-SODIUM CITRATE 334-500 MG/5ML PO SOLN: 30.0000 mL | ORAL | Status: DC | PRN

## 2013-03-06 MED ORDER — EPHEDRINE 5 MG/ML INJ
10.0000 mg | INTRAVENOUS | Status: DC | PRN
  Filled 2013-03-06: qty 4

## 2013-03-06 MED ORDER — SODIUM CHLORIDE 0.9 % IJ SOLN: 3.0000 mL | INTRAMUSCULAR | Status: DC | PRN

## 2013-03-06 MED ORDER — ONDANSETRON HCL 4 MG/2ML IJ SOLN: 4.0000 mg | Freq: Three times a day (TID) | INTRAMUSCULAR | Status: DC | PRN

## 2013-03-06 MED ORDER — METOCLOPRAMIDE HCL 5 MG/ML IJ SOLN
INTRAMUSCULAR | Status: DC | PRN
  Administered 2013-03-06 (×2): 5 mg via INTRAVENOUS

## 2013-03-06 MED ORDER — ONDANSETRON HCL 4 MG PO TABS: 4.0000 mg | ORAL_TABLET | ORAL | Status: DC | PRN

## 2013-03-06 MED ORDER — SODIUM BICARBONATE 8.4 % IV SOLN
INTRAVENOUS | Status: AC
  Filled 2013-03-06: qty 50

## 2013-03-06 MED ORDER — LACTATED RINGERS IV SOLN: 500.0000 mL | INTRAVENOUS | Status: DC | PRN

## 2013-03-06 MED ORDER — SIMETHICONE 80 MG PO CHEW
80.0000 mg | CHEWABLE_TABLET | ORAL | Status: DC
  Administered 2013-03-07 – 2013-03-08 (×3): 80 mg via ORAL
  Filled 2013-03-06 (×3): qty 1

## 2013-03-06 MED ORDER — SIMETHICONE 80 MG PO CHEW: 80.0000 mg | CHEWABLE_TABLET | ORAL | Status: DC | PRN

## 2013-03-06 MED ORDER — SODIUM BICARBONATE 8.4 % IV SOLN
INTRAVENOUS | Status: DC | PRN
  Administered 2013-03-06 (×3): 5 mL via EPIDURAL
  Administered 2013-03-06: 10 mL via EPIDURAL
  Administered 2013-03-06: 5 mL via EPIDURAL

## 2013-03-06 MED ORDER — PHENYLEPHRINE 40 MCG/ML (10ML) SYRINGE FOR IV PUSH (FOR BLOOD PRESSURE SUPPORT)
80.0000 ug | PREFILLED_SYRINGE | INTRAVENOUS | Status: DC | PRN
  Filled 2013-03-06: qty 10

## 2013-03-06 MED ORDER — CEFAZOLIN SODIUM-DEXTROSE 2-3 GM-% IV SOLR
2.0000 g | Freq: Three times a day (TID) | INTRAVENOUS | Status: AC
  Administered 2013-03-07 (×2): 2 g via INTRAVENOUS
  Filled 2013-03-06 (×2): qty 50

## 2013-03-06 MED ORDER — DIPHENHYDRAMINE HCL 50 MG/ML IJ SOLN: 25.0000 mg | INTRAMUSCULAR | Status: DC | PRN

## 2013-03-06 MED ORDER — IBUPROFEN 600 MG PO TABS: 600.0000 mg | ORAL_TABLET | Freq: Four times a day (QID) | ORAL | Status: DC | PRN

## 2013-03-06 MED ORDER — MEASLES, MUMPS & RUBELLA VAC ~~LOC~~ INJ: 0.5000 mL | INJECTION | Freq: Once | SUBCUTANEOUS | Status: DC

## 2013-03-06 MED ORDER — CITRIC ACID-SODIUM CITRATE 334-500 MG/5ML PO SOLN
30.0000 mL | ORAL | Status: DC | PRN
  Administered 2013-03-06: 30 mL via ORAL
  Filled 2013-03-06: qty 15

## 2013-03-06 MED ORDER — OXYTOCIN 10 UNIT/ML IJ SOLN
40.0000 [IU] | INTRAVENOUS | Status: DC | PRN
  Administered 2013-03-06: 40 [IU] via INTRAVENOUS

## 2013-03-06 MED ORDER — LACTATED RINGERS IV SOLN: 500.0000 mL | Freq: Once | INTRAVENOUS | Status: DC

## 2013-03-06 MED ORDER — DIPHENHYDRAMINE HCL 25 MG PO CAPS
25.0000 mg | ORAL_CAPSULE | ORAL | Status: DC | PRN
  Filled 2013-03-06: qty 1

## 2013-03-06 MED ORDER — DIPHENHYDRAMINE HCL 25 MG PO CAPS: 25.0000 mg | ORAL_CAPSULE | Freq: Four times a day (QID) | ORAL | Status: DC | PRN

## 2013-03-06 MED ORDER — ONDANSETRON HCL 4 MG/2ML IJ SOLN
INTRAMUSCULAR | Status: AC
  Filled 2013-03-06: qty 2

## 2013-03-06 MED ORDER — TETANUS-DIPHTH-ACELL PERTUSSIS 5-2.5-18.5 LF-MCG/0.5 IM SUSP: 0.5000 mL | Freq: Once | INTRAMUSCULAR | Status: DC

## 2013-03-06 MED ORDER — METOCLOPRAMIDE HCL 5 MG/ML IJ SOLN
INTRAMUSCULAR | Status: AC
  Filled 2013-03-06: qty 2

## 2013-03-06 MED ORDER — OXYTOCIN 40 UNITS IN LACTATED RINGERS INFUSION - SIMPLE MED
1.0000 m[IU]/min | INTRAVENOUS | Status: DC
  Administered 2013-03-06: 1 m[IU]/min via INTRAVENOUS

## 2013-03-06 MED ORDER — SENNOSIDES-DOCUSATE SODIUM 8.6-50 MG PO TABS
2.0000 | ORAL_TABLET | ORAL | Status: DC
  Administered 2013-03-07 – 2013-03-08 (×3): 2 via ORAL
  Filled 2013-03-06 (×3): qty 2

## 2013-03-06 SURGICAL SUPPLY — 33 items
CLAMP CORD UMBIL (MISCELLANEOUS) IMPLANT
CLOTH BEACON ORANGE TIMEOUT ST (SAFETY) ×2 IMPLANT
CONTAINER PREFILL 10% NBF 15ML (MISCELLANEOUS) IMPLANT
DRAPE LG THREE QUARTER DISP (DRAPES) IMPLANT
DRSG OPSITE POSTOP 4X10 (GAUZE/BANDAGES/DRESSINGS) ×2 IMPLANT
DRSG VASELINE 3X18 (GAUZE/BANDAGES/DRESSINGS) ×2 IMPLANT
DURAPREP 26ML APPLICATOR (WOUND CARE) ×2 IMPLANT
ELECT REM PT RETURN 9FT ADLT (ELECTROSURGICAL) ×2
ELECTRODE REM PT RTRN 9FT ADLT (ELECTROSURGICAL) ×1 IMPLANT
EXTRACTOR VACUUM KIWI (MISCELLANEOUS) IMPLANT
EXTRACTOR VACUUM M CUP 4 TUBE (SUCTIONS) IMPLANT
GLOVE BIO SURGEON STRL SZ7.5 (GLOVE) ×2 IMPLANT
GOWN PREVENTION PLUS XLARGE (GOWN DISPOSABLE) ×2 IMPLANT
GOWN STRL REIN XL XLG (GOWN DISPOSABLE) ×2 IMPLANT
KIT ABG SYR 3ML LUER SLIP (SYRINGE) IMPLANT
NDL HYPO 25X5/8 SAFETYGLIDE (NEEDLE) IMPLANT
NEEDLE HYPO 25X5/8 SAFETYGLIDE (NEEDLE) IMPLANT
NS IRRIG 1000ML POUR BTL (IV SOLUTION) ×2 IMPLANT
PACK C SECTION WH (CUSTOM PROCEDURE TRAY) ×2 IMPLANT
PAD OB MATERNITY 4.3X12.25 (PERSONAL CARE ITEMS) ×2 IMPLANT
RTRCTR C-SECT PINK 25CM LRG (MISCELLANEOUS) ×1 IMPLANT
SPONGE GAUZE 4X4 12PLY (GAUZE/BANDAGES/DRESSINGS) ×1 IMPLANT
STAPLER VISISTAT 35W (STAPLE) ×1 IMPLANT
SUT PLAIN 0 NONE (SUTURE) IMPLANT
SUT VIC AB 0 CT1 36 (SUTURE) ×16 IMPLANT
SUT VIC AB 3-0 CTX 36 (SUTURE) ×2 IMPLANT
SUT VIC AB 3-0 SH 27 (SUTURE)
SUT VIC AB 3-0 SH 27X BRD (SUTURE) IMPLANT
SUT VIC AB 4-0 KS 27 (SUTURE) IMPLANT
SUT VICRYL 0 TIES 12 18 (SUTURE) IMPLANT
TOWEL OR 17X24 6PK STRL BLUE (TOWEL DISPOSABLE) ×2 IMPLANT
TRAY FOLEY CATH 14FR (SET/KITS/TRAYS/PACK) ×1 IMPLANT
WATER STERILE IRR 1000ML POUR (IV SOLUTION) ×1 IMPLANT

## 2013-03-06 NOTE — Progress Notes (Signed)
Patient ID: [name], female   DOB: [birth year], 27 y.o.   MRN: [id number]  The cervix remains 8 cm and the vertex is at -1/0 station Will proceed with c section for arrest of dilatation.

## 2013-03-06 NOTE — Anesthesia Preprocedure Evaluation (Signed)

## 2013-03-06 NOTE — H&P (Signed)
[name] is a 27 y.o. female [phone] at 38 6/7 weeks (EDD 02/28/13 by LMP c/w 10 week [place])  presenting for contractions and cervical change.  Pt seen in MAU 11/11 with contractions and 1cm.  Returned about 1 am 03/06/13 and was 3cm dilated with BBOW.  Pt was admitted and has progressed to 6 cm over last several hours with SROM about 330am.  Prenatal care complicated by +GBS, but no other issues.    Maternal Medical History:  Reason for admission: Contractions.   Contractions: Onset was 3-5 hours ago.   Frequency: regular.   Perceived severity is moderate.    Fetal activity: Perceived fetal activity is normal.    Prenatal Complications - Diabetes: none.    OB History   Grav Para Term Preterm Abortions TAB SAB Ect Mult Living   5 2 2  2 1 1   1     2008 NSVD 7#6oz EAB x 2 SAB x 1 with D&C  Past Medical History  Diagnosis Date  . Fibroadenoma of breast     LEFT  . LGSIL (low grade squamous intraepithelial dysplasia) 12/2008, 02/2011    C&B WITH LGSIL 02/2011  . IBS (irritable bowel syndrome)   . Elevated prolactin level     30 range 12/2008, normal X multiple repeats, most recent 18 04/2011  . STD (sexually transmitted disease)     History of Chlamydia  . Abnormal Pap smear    Past Surgical History  Procedure Laterality Date  . Induced abortion  9562,1308    x2  . Adenoidectomy      ASA CHILD  . Colposcopy    . Dilation and curettage of uterus     Family History: family history includes Aneurysm in her maternal grandfather; Other in her brother. There is no history of Alcohol abuse, Arthritis, Asthma, Birth defects, Cancer, COPD, Depression, Diabetes, Drug abuse, Early death, Hearing loss, Heart disease, Hyperlipidemia, Hypertension, Kidney disease, Learning disabilities, Mental illness, Mental retardation, Miscarriages / Stillbirths, Vision loss, or Varicose Veins. Social History:  reports that she quit smoking about 2 years ago. Her smoking use included Cigarettes.  She smoked 0.00 packs per day for 3 years. She has never used smokeless tobacco. She reports that she drinks alcohol. She reports that she does not use illicit drugs.   Prenatal Transfer Tool  Maternal Diabetes: No Genetic Screening: Normal Maternal Ultrasounds/Referrals: Normal Fetal Ultrasounds or other Referrals:  None Maternal Substance Abuse:  No Significant Maternal Medications:  None Significant Maternal Lab Results:  Lab values include: Group B Strep positive--Treated in labor with PCN Other Comments:  None  ROS  Blood pressure 123/64, pulse 76, temperature 97.7 F (36.5 C), temperature source Oral, resp. rate 18, height 5\' 4"  (1.626 m), weight 80.559 kg (177 lb 9.6 oz). Cervix 80/6/-2  FHR is reassuring category 1  Maternal Exam:  Uterine Assessment: Contraction strength is firm.  Contraction frequency is regular.   Abdomen: Patient reports no abdominal tenderness. Fetal presentation: vertex  Introitus: Normal vulva. Normal vagina.  Pelvis: adequate for delivery.      Physical Exam  Constitutional: She is oriented to person, place, and time. She appears well-developed and well-nourished.  Cardiovascular: Normal rate and regular rhythm.   Respiratory: Effort normal and breath sounds normal.  GI: Soft. Bowel sounds are normal.  Genitourinary: Vagina normal.  Gravid EFW 7 1/2 lbs  Neurological: She is alert and oriented to person, place, and time.  Psychiatric: She has a normal mood and affect.  Prenatal labs: ABO, Rh: O/Positive/-- (04/17 0000) Antibody: Negative (04/17 0000) Rubella: Immune (04/17 0000) RPR: Nonreactive (04/17 0000)  HBsAg: Negative (04/17 0000)  HIV: Non-reactive (04/17 0000)  GBS: Positive (10/13 0000)  First trimester screen and AFP WNL One hour GTT 129 Hgb AA  Assessment/Plan: Pt admitted in labor and has progressed on her own.  Has received stadol x 2.  Declines epidural currently. She is on PCN for her +GBS status.  Will continue  to follow progress.   Oliver Pila 03/06/2013, 4:33 AM

## 2013-03-06 NOTE — Progress Notes (Signed)
Patient ID: [name], female   DOB: 03-29-1986, 27 y.o.   MRN: [id number] #1 afebrile BP normal Output good. She has ambulated without difficulty HGB 11.3 to 7.7

## 2013-03-06 NOTE — Progress Notes (Signed)
Patient ID: [name], female   DOB: 05/08/1985, 27 y.o.   MRN: [id number] Pt received an epidural and is comfortable. The cervix is 6 cm 80% effaced and the vertex is at -1/-2 station. Contractions are q 3 minutes and pt is on penicillin.FHT's are normal

## 2013-03-06 NOTE — Anesthesia Procedure Notes (Signed)
Epidural Patient location during procedure: OB  Preanesthetic Checklist Completed: patient identified, site marked, surgical consent, pre-op evaluation, timeout performed, IV checked, risks and benefits discussed and monitors and equipment checked  Epidural Patient position: sitting Prep: site prepped and draped and DuraPrep Patient monitoring: continuous pulse ox and blood pressure Approach: midline Injection technique: LOR air  Needle:  Needle type: Tuohy  Needle gauge: 17 G Needle length: 9 cm and 9 Needle insertion depth: 6 cm Catheter type: closed end flexible Catheter size: 19 Gauge Catheter at skin depth: 12 cm Test dose: negative  Assessment Events: blood not aspirated, injection not painful, no injection resistance, negative IV test and no paresthesia  Additional Notes Dosing of Epidural: sprotte to clear CSF 3cc of Bupiv/fent from pump IT Catheter passed easily Patient is more comfortable after epidural dosed. Please see RN's note for documentation of vital signs,and FHR which are stable.  Patient reminded not to try to ambulate with numb legs, and that an RN must be present when she attempts to get up.

## 2013-03-06 NOTE — Transfer of Care (Signed)
Immediate Anesthesia Transfer of Care Note  Patient: [name]  Procedure(s) Performed: Procedure(s): CESAREAN SECTION (N/A)  Patient Location: PACU  Anesthesia Type:Epidural  Level of Consciousness: awake, alert  and oriented  Airway & Oxygen Therapy: Patient Spontanous Breathing  Post-op Assessment: Report given to PACU RN and Post -op Vital signs reviewed and stable  Post vital signs: Reviewed and stable  Complications: No apparent anesthesia complications

## 2013-03-06 NOTE — Anesthesia Postprocedure Evaluation (Signed)
  Anesthesia Post-op Note  Anesthesia Post Note  Patient: [name]  Procedure(s) Performed: Procedure(s) (LRB): CESAREAN SECTION (N/A)  Anesthesia type: Epidural  Patient location: PACU  Post pain: Pain level controlled  Post assessment: Post-op Vital signs reviewed  Last Vitals:  Filed Vitals:   03/06/13 1900  BP: 107/62  Pulse: 99  Temp: 36.7 C  Resp: 18    Post vital signs: stable  Level of consciousness: awake  Complications: No apparent anesthesia complications

## 2013-03-06 NOTE — MAU Note (Signed)
Pt states she started having contractions about 1800.

## 2013-03-06 NOTE — Lactation Note (Signed)
This note was copied from the chart of Cassandra [place] Delfin. Lactation Consultation Note Request to see patient per RN.  Mom reports having nipples pierced 12 months ago and having a yellow green discharge since then with an odor.  Mom took out piercing when pregnant and still has drainage with odor.  Observed small amount of red drainage from nipple piercing area.  Colostrum expressed as well clear in color.  Mom reports different odor noticed by her. Colostrum rubbed into nipples.  Offered mom DEBP through the night time to stimulate milk supply until she can ask her MD, who previously told her it was normal.  I will report to morning LC to follow up with mom.    Patient Name: [name] Date: 03/06/2013     Maternal Data    Feeding Feeding Type: Bottle Fed - Formula  LATCH Score/Interventions                      Lactation Tools Discussed/Used     Consult Status      [name], Arvella Merles 03/06/2013, 11:44 PM

## 2013-03-06 NOTE — Progress Notes (Signed)
Patient ID: [name], female   DOB: [birth year], 27 y.o.   MRN: [id number] Pt getting increasingly uncomfortable afeb vss FHR reactive Cervix unchanged 8/80/0-(-1)  Still OP D/w pt options and she would like to get an epidural.

## 2013-03-06 NOTE — Progress Notes (Signed)
Patient ID: [name], female   DOB: [birth year], 27 y.o.   MRN: [id number] Contractions are q 2 minutes The cervix is 8 cm The anterior lip is less puffy.but the vertex is still at -1/0 station.

## 2013-03-06 NOTE — Progress Notes (Signed)
Patient ID: [name], female   DOB: [birth year], 27 y.o.   MRN: [id number] Pt is comfortable Cervix is 7 cm 80-90% effaced and the vertex is at - 1 station.

## 2013-03-06 NOTE — MAU Note (Signed)
Pt reports leaking clear brownish fluid since 1300 and having contractions.  Denies any problems with pregnancy.

## 2013-03-06 NOTE — Progress Notes (Signed)
Patient ID: [name], female   DOB: March 08, 1986, 27 y.o.   MRN: [id number] The cervix is 8 cm with a thick anterior lip and the vertex is at -1 station. The FHR tracing is normal and the pitocin is at 5 mu/minute and the contractions are close to 5 in 10 minutes.

## 2013-03-06 NOTE — Progress Notes (Signed)
Patient ID: [name], female   DOB: 1985/07/28, 27 y.o.   MRN: [id number] Having some back labor but coping afeb VSS FHR reassuring 80/8/0  Prob OP still

## 2013-03-07 LAB — CBC
HCT: 23.1 % — ABNORMAL LOW (ref 36.0–46.0)
MCH: 27.7 pg (ref 26.0–34.0)
MCHC: 33.3 g/dL (ref 30.0–36.0)
MCV: 83.1 fL (ref 78.0–100.0)
Platelets: 178 10*3/uL (ref 150–400)
RBC: 2.78 MIL/uL — ABNORMAL LOW (ref 3.87–5.11)

## 2013-03-07 NOTE — Progress Notes (Signed)
0830 pt unable to void since 0430 catheter removal. Bladder scanned for 400. I/O cath for 300

## 2013-03-07 NOTE — Anesthesia Postprocedure Evaluation (Signed)
Anesthesia Post Note  Patient: [name]  Procedure(s) Performed: Procedure(s) (LRB): CESAREAN SECTION (N/A)  Anesthesia type: Epidural  Patient location: Mother/Baby  Post pain: Pain level controlled  Post assessment: Post-op Vital signs reviewed  Last Vitals:  Filed Vitals:   03/07/13 0438  BP: 107/67  Pulse: 102  Temp: 36.8 C  Resp: 16    Post vital signs: Reviewed  Level of consciousness:alert  Complications: No apparent anesthesia complications

## 2013-03-07 NOTE — Op Note (Signed)
[name], [name]           ACCOUNT NO.:  [id number]  MEDICAL RECORD NO.:  [id number]  LOCATION:  [facility]                          FACILITY:  [facility]  PHYSICIAN:  Malachi Pro. Ambrose Mantle, M.D. DATE OF BIRTH:  04-10-1986  DATE OF PROCEDURE:  03/06/2013 DATE OF DISCHARGE:                              OPERATIVE REPORT   PREOPERATIVE DIAGNOSIS:  Intrauterine pregnancy at term, with arrest of dilatation at 8 cm.  POSTOPERATIVE DIAGNOSIS:  Intrauterine pregnancy at term, with arrest of dilatation at 8 cm.  OPERATION:  Low-transverse cervical C-section.  OPERATOR:  Malachi Pro. Ambrose Mantle, M.D.  ANESTHESIA:  Epidural anesthesia.  PROCEDURE DESCRIPTION:  The patient was brought to the operating room, and the epidural anesthetic had been boosted after we had prepped the patient with a Foley catheter indwelling that showed hematuria.  The anesthetist checked for anesthesia and she could still feel the pain, so we had to give her more epidural and then wait until the epidural was satisfactory, then we began the surgery.  A low-transverse cervical C- section was done after sterile draping with a transverse incision through the skin, subcutaneous tissue, and fascia.  Fascia was incised transversely, separated from the rectus muscles superiorly and inferiorly.  Rectus muscle was then split in the midline.  The peritoneum was opened vertically.  The lower uterine segment was exposed with an Teacher, early years/pre, and I checked underneath the solid part of the Alexis retractor to make sure, no bowel was entrapped.  I then made a short transverse incision through the superficial layers of the myometrium in the lower uterine segment.  Then, went into the amniotic sac with my finger, pulled superiorly and inferiorly.  There was slightly meconium-stained fluid.  The vertex was directly OP.  The vertex was elevated into the incisional opening, and it was immediately apparent that there was a lot of bleeding coming  from the angles of the incision.  After the nose and mouth were suctioned with the bulb, I delivered the rest of the baby.  The cord was clamped, and I asked the assistance to take the baby to the waiting neonatologist because I had to try to attend to the heavy bleeding from the uterine angles.  The placenta was removed.  The additional membranes were removed, and I was able to secure the bleeders at both uterine angles and along the lower edge of the incision.  I then closed the uterus after checking to make sure no products of conception remained in the uterus.  The uterus was closed with 2 running sutures of 0 Vicryl locking the first layer, nonlocking on the second layer, and an additional figure-of-eight suture was required for complete hemostasis.  Liberal irrigation confirmed hemostasis.  The gutters were blotted free of blood.  The Alexis retractor was removed, and the abdominal wall was closed in layers using interrupted sutures of 0 Vicryl to close the rectus muscle and peritoneum in 1 layer.  Two running sutures of 0 Vicryl on the fascia, running 3-0 Vicryl on the subcutaneous tissue, and staples on the skin. The patient seemed to tolerate the procedure well with normal vital signs; however, the anesthetist did estimate blood loss at 1750 mL.  The patient  was returned to recovery in satisfactory condition.     Malachi Pro. Ambrose Mantle, M.D.     TFH/MEDQ  D:  03/06/2013  T:  03/06/2013  Job:  [id number]

## 2013-03-08 ENCOUNTER — Encounter (HOSPITAL_COMMUNITY): Payer: Self-pay | Admitting: Obstetrics and Gynecology

## 2013-03-08 NOTE — Progress Notes (Signed)
Subjective: Postpartum Day #2: Cesarean Delivery Patient reports incisional pain, tolerating PO and no problems voiding.    Objective: Vital signs in last 24 hours: Temp:  [97.7 F (36.5 C)-98.3 F (36.8 C)] 97.7 F (36.5 C) (11/15 0515) Pulse Rate:  [87-109] 87 (11/15 0515) Resp:  [18] 18 (11/15 0515) BP: (102-103)/(64-67) 103/67 mmHg (11/15 0515)  Physical Exam:  General: alert Lochia: appropriate Uterine Fundus: firm Incision: dressing C/D/I   Recent Labs  03/06/13 0105 03/07/13 0550  HGB 11.3* 7.7*  HCT 33.8* 23.1*    Assessment/Plan: Status post Cesarean section. Doing well postoperatively.  Continue current care, ambulate.  Sharlon Pfohl D 03/08/2013, 9:08 AM

## 2013-03-09 DIAGNOSIS — Z98891 History of uterine scar from previous surgery: Secondary | ICD-10-CM

## 2013-03-09 MED ORDER — IBUPROFEN 600 MG PO TABS: 600.0000 mg | ORAL_TABLET | Freq: Four times a day (QID) | ORAL | Status: DC

## 2013-03-09 MED ORDER — OXYCODONE-ACETAMINOPHEN 5-325 MG PO TABS: 1.0000 | ORAL_TABLET | ORAL | Status: DC | PRN

## 2013-03-09 NOTE — Discharge Summary (Signed)
Obstetric Discharge Summary Reason for Admission: onset of labor Prenatal Procedures: none Intrapartum Procedures: cesarean: low cervical, transverse Postpartum Procedures: none Complications-Operative and Postpartum: none Hemoglobin  Date Value Range Status  03/07/2013 7.7* 12.0 - 15.0 g/dL Final     DELTA CHECK NOTED     REPEATED TO VERIFY     HCT  Date Value Range Status  03/07/2013 23.1* 36.0 - 46.0 % Final    Physical Exam:  General: alert Lochia: appropriate Uterine Fundus: firm Incision: healing well   Discharge Diagnoses: Term Pregnancy-delivered and arrest of dilation  Discharge Information: Date: 03/09/2013 Activity: pelvic rest and no strenuous activity Diet: routine Medications: Ibuprofen and Percocet Condition: stable Instructions: refer to practice specific booklet Discharge to: home Follow-up Information   Follow up with Bing Plume, MD. Schedule an appointment as soon as possible for a visit in 2 weeks.   Specialty:  Obstetrics and Gynecology   Contact information:   [address], [address], [address] [place] [address] [phone]       Newborn Data: Live born female  Birth Weight: 8 lb 0.6 oz (3645 g) APGAR: 8, 9  Home with mother.  Demi Trieu D 03/09/2013, 9:37 AM

## 2013-03-09 NOTE — Progress Notes (Signed)
POD #3 Doing ok, some swelling in legs Afeb, VSS Abd- soft, fundus firm, incision intact D/c home

## 2013-03-10 ENCOUNTER — Encounter (HOSPITAL_COMMUNITY): Payer: Self-pay | Admitting: *Deleted

## 2013-09-02 ENCOUNTER — Encounter: Payer: Self-pay | Admitting: Women's Health

## 2013-09-02 ENCOUNTER — Ambulatory Visit (INDEPENDENT_AMBULATORY_CARE_PROVIDER_SITE_OTHER): Payer: 59 | Admitting: Women's Health

## 2013-09-02 DIAGNOSIS — N949 Unspecified condition associated with female genital organs and menstrual cycle: Secondary | ICD-10-CM

## 2013-09-02 DIAGNOSIS — Z113 Encounter for screening for infections with a predominantly sexual mode of transmission: Secondary | ICD-10-CM

## 2013-09-02 DIAGNOSIS — N898 Other specified noninflammatory disorders of vagina: Secondary | ICD-10-CM

## 2013-09-02 LAB — WET PREP FOR TRICH, YEAST, CLUE
Trich, Wet Prep: NONE SEEN
YEAST WET PREP: NONE SEEN

## 2013-09-02 MED ORDER — METRONIDAZOLE 0.75 % VA GEL: VAGINAL | Status: DC

## 2013-09-02 NOTE — Patient Instructions (Signed)
Bacterial Vaginosis Bacterial vaginosis is an infection of the vagina. It happens when too many of certain germs (bacteria) grow in the vagina. HOME CARE  Take your medicine as told by your doctor.  Finish your medicine even if you start to feel better.  Do not have sex until you finish your medicine and are better.  Tell your sex partner that you have an infection. They should see their doctor for treatment.  Practice safe sex. Use condoms. Have only one sex partner. GET HELP IF:  You are not getting better after 3 days of treatment.  You have more grey fluid (discharge) coming from your vagina than before.  You have more pain than before.  You have a fever. MAKE SURE YOU:   Understand these instructions.  Will watch your condition.  Will get help right away if you are not doing well or get worse. Document Released: 01/18/2008 Document Revised: 01/29/2013 Document Reviewed: 11/20/2012 ExitCare Patient Information 2014 [employer].  

## 2013-09-02 NOTE — Progress Notes (Signed)
Patient ID: [name], female   DOB: 07/20/1985, 28 y.o.   MRN: [id number] Presents with complaint of left breast discomfort and requesting STD check. C-section 02/2013 son is 47 months old and thriving/bottlefeeding. Partner unfaithful. Nexplanon with occasional irregular bleeding. Denies fever, abdominal pain, urinary symptoms. Having some discharge with odor.  Exam: Appears well. Breast exam sitting and lying position without palpable nodule, discharge, some firmness to the left breast without discomfort or discoloration. No visible mastitis. History of bilateral nipple piercing, removed during pregnancy with some leakage during pregnancy. Did not breast feed, reports questionable infection but no antibiotics were prescribed.. External genitalia within normal limits, speculum exam scant bloody discharge with odor noted, wet prep positive for amines, clues, and TNTC bacteria. GC/Chlamydia culture taken. Bimanual uterus is small, nontender no CMT.  Bacteria vaginosis STD screening  Plan: GC/Chlamydia culture pending, HIV, hep B, C., RPR. Counseling encouraged, Almyra Free Whitt's name given, MetroGel vaginal cream 1 applicator at bedtime x5, prescription, proper use given and reviewed. Alcohol precautions reviewed. Vitamin E twice daily, decreasing caffeinated beverages, call if tenderness persists greater than 2 weeks.

## 2013-09-03 LAB — GC/CHLAMYDIA PROBE AMP
CT Probe RNA: NEGATIVE
GC PROBE AMP APTIMA: NEGATIVE

## 2013-09-05 ENCOUNTER — Telehealth: Payer: Self-pay | Admitting: *Deleted

## 2013-09-05 NOTE — Telephone Encounter (Signed)
Pt requesting STD result, left message for pt to call

## 2013-09-10 ENCOUNTER — Telehealth: Payer: Self-pay | Admitting: *Deleted

## 2013-09-10 DIAGNOSIS — N644 Mastodynia: Secondary | ICD-10-CM

## 2013-09-10 NOTE — Telephone Encounter (Signed)
Yes, lets have her get left breast US,  Hx of bilateral nipple piercing,  removed during pregnancy, delivered  6 months ago, did not breast feed. It did not appear to be mastitis at office visit.

## 2013-09-10 NOTE — Telephone Encounter (Signed)
Pt informed, with the below, breast center will contact patient to schedule.

## 2013-09-10 NOTE — Telephone Encounter (Signed)
Pt was seen on 09/02/13 c/o left breast discomfort, pt said the discomfort is getting worse. Another OV? Schedule breast u/s? Please advise

## 2013-09-11 NOTE — Telephone Encounter (Signed)
appointment 09/16/13 @ 8:20 am

## 2013-09-16 ENCOUNTER — Ambulatory Visit
Admission: RE | Admit: 2013-09-16 | Discharge: 2013-09-16 | Disposition: A | Discharge status: Home / Self Care | Payer: 59 | Source: Ambulatory Visit | Attending: Women's Health | Admitting: Women's Health

## 2013-09-16 ENCOUNTER — Other Ambulatory Visit: Payer: Self-pay | Admitting: Women's Health

## 2013-09-16 ENCOUNTER — Other Ambulatory Visit: Payer: 59

## 2013-09-16 DIAGNOSIS — N644 Mastodynia: Secondary | ICD-10-CM

## 2013-09-16 LAB — RPR

## 2013-09-16 LAB — HIV ANTIBODY (ROUTINE TESTING W REFLEX): HIV 1&2 Ab, 4th Generation: NONREACTIVE

## 2013-09-16 LAB — HEPATITIS B SURFACE ANTIGEN: Hepatitis B Surface Ag: NEGATIVE

## 2013-09-16 LAB — HEPATITIS C ANTIBODY: HCV Ab: NEGATIVE

## 2013-09-30 ENCOUNTER — Other Ambulatory Visit: Payer: 59

## 2013-10-01 ENCOUNTER — Other Ambulatory Visit: Payer: 59

## 2013-10-06 ENCOUNTER — Other Ambulatory Visit: Payer: 59

## 2013-10-07 ENCOUNTER — Ambulatory Visit
Admission: RE | Admit: 2013-10-07 | Discharge: 2013-10-07 | Disposition: A | Discharge status: Home / Self Care | Payer: 59 | Source: Ambulatory Visit | Attending: Women's Health | Admitting: Women's Health

## 2013-10-07 ENCOUNTER — Encounter (INDEPENDENT_AMBULATORY_CARE_PROVIDER_SITE_OTHER): Payer: Self-pay

## 2013-10-07 DIAGNOSIS — N644 Mastodynia: Secondary | ICD-10-CM

## 2013-10-13 ENCOUNTER — Telehealth: Payer: Self-pay | Admitting: *Deleted

## 2013-10-13 ENCOUNTER — Other Ambulatory Visit: Payer: Self-pay | Admitting: Women's Health

## 2013-10-13 DIAGNOSIS — N644 Mastodynia: Secondary | ICD-10-CM

## 2013-10-13 NOTE — Telephone Encounter (Signed)
(  You are back up MD) pt had left breast ultrasound on 09/16/13 at breast center see result. Pt took antibiotics prescribed, states pain is back in left breast. Pt called breast center and has ultrasound scheduled on 10/20/13, pt said she unable get in sooner due to work.She asked if you would be willing to give Rx for breast pain? I explained to patient I don't think you would but I would check. Should patient be worked in sooner to be seen here? Please advise

## 2013-10-13 NOTE — Telephone Encounter (Signed)
Recommend office visit for exam

## 2013-10-13 NOTE — Telephone Encounter (Signed)
Pt informed, transferred to front desk 

## 2013-10-20 ENCOUNTER — Other Ambulatory Visit: Payer: 59

## 2013-10-31 ENCOUNTER — Ambulatory Visit
Admission: RE | Admit: 2013-10-31 | Discharge: 2013-10-31 | Disposition: A | Discharge status: Home / Self Care | Payer: 59 | Source: Ambulatory Visit | Attending: Women's Health | Admitting: Women's Health

## 2013-10-31 ENCOUNTER — Encounter (INDEPENDENT_AMBULATORY_CARE_PROVIDER_SITE_OTHER): Payer: Self-pay

## 2013-10-31 DIAGNOSIS — N644 Mastodynia: Secondary | ICD-10-CM

## 2013-11-20 ENCOUNTER — Encounter (INDEPENDENT_AMBULATORY_CARE_PROVIDER_SITE_OTHER): Payer: Self-pay | Admitting: General Surgery

## 2013-11-20 ENCOUNTER — Ambulatory Visit (INDEPENDENT_AMBULATORY_CARE_PROVIDER_SITE_OTHER): Payer: 59 | Admitting: General Surgery

## 2013-11-20 VITALS — BP 126/80 | HR 76 | Temp 97.5°F | Ht 64.0 in | Wt 147.0 lb

## 2013-11-20 DIAGNOSIS — N61 Mastitis without abscess: Secondary | ICD-10-CM

## 2013-11-20 NOTE — Patient Instructions (Signed)
I suspect that you had some type of a bacterial infection in her left breast. This has now completely resolved.  There is no evidence of infection, abscess, nipple drainage.  There is no indication for surgery.  There is no indication for antibiotics.  Return to see Dr. Dalbert Batman if this area flares up again.

## 2013-11-20 NOTE — Progress Notes (Signed)
Patient ID: [name], female   DOB: 09-Dec-1985, 28 y.o.   MRN: [id number]  Chief Complaint  Patient presents with  . eval left breast    HPI [name] is a 28 y.o. female.  She was referred by Dr. Curlene Dolphin at the [facility] for evaluation of possible left breast infection. Dr. Phineas Real is her obstetrician.  This is a healthy young woman. Gravida 2 para 2. Delivered her second child in November 2014. She did not breast-feed. She had piercings of both nipples but removed these in April 2014.  In April 2015 she started having pain and a lump in the left breast. An ultrasound on 09/17/2013 looked like cellulitis and she was placed on Keflex by Dr. Valinda Party. Followup on 10/07/2013  revealed that she was much better. She returned to the breast center on July 10 because the left breast, superior retroareolar area felt more palpable and tender. Ultrasound suggests low-grade infection but no drainable abscess. No further antibiotics were prescribed. She was referred to surgery.  She now states that she has no pain, tenderness, drainage, or mass. She says the breast feels normal female.  HPI  Past Medical History  Diagnosis Date  . Fibroadenoma of breast     LEFT  . LGSIL (low grade squamous intraepithelial dysplasia) 12/2008, 02/2011    C&B WITH LGSIL 02/2011  . IBS (irritable bowel syndrome)   . Elevated prolactin level     30 range 12/2008, normal X multiple repeats, most recent 18 04/2011  . STD (sexually transmitted disease)     History of Chlamydia  . Abnormal Pap smear     Past Surgical History  Procedure Laterality Date  . Induced abortion  4098,1191    x2  . Adenoidectomy      ASA CHILD  . Colposcopy    . Dilation and curettage of uterus    . Cesarean section N/A 03/06/2013    Procedure: CESAREAN SECTION;  Surgeon: Melina Schools, MD;  Location: [facility];  Service: Obstetrics;  Laterality: N/A;    Family History  Problem Relation Age of Onset  . Other Brother      TB  . Aneurysm Maternal Grandfather   . Alcohol abuse Neg Hx   . Arthritis Neg Hx   . Asthma Neg Hx   . Birth defects Neg Hx   . Cancer Neg Hx   . COPD Neg Hx   . Depression Neg Hx   . Diabetes Neg Hx   . Drug abuse Neg Hx   . Early death Neg Hx   . Hearing loss Neg Hx   . Heart disease Neg Hx   . Hyperlipidemia Neg Hx   . Hypertension Neg Hx   . Kidney disease Neg Hx   . Learning disabilities Neg Hx   . Mental illness Neg Hx   . Mental retardation Neg Hx   . Miscarriages / Stillbirths Neg Hx   . Vision loss Neg Hx   . Varicose Veins Neg Hx     Social History History  Substance Use Topics  . Smoking status: Former Smoker -- 3 years    Types: Cigarettes    Quit date: 04/12/2010  . Smokeless tobacco: Never Used     Comment: Maybe 1 cigarette every 2 days  . Alcohol Use: Yes     Comment: Occasional    Allergies  Allergen Reactions  . Banana Itching    Current Outpatient Prescriptions  Medication Sig Dispense Refill  .  etonogestrel (NEXPLANON) 68 MG IMPL implant Inject 1 each into the skin once.       No current facility-administered medications for this visit.    Review of Systems Review of Systems  Constitutional: Negative for fever, chills and unexpected weight change.  HENT: Negative for congestion, hearing loss, sore throat, trouble swallowing and voice change.   Eyes: Negative for visual disturbance.  Respiratory: Negative for cough and wheezing.   Cardiovascular: Negative for chest pain, palpitations and leg swelling.  Gastrointestinal: Negative for nausea, vomiting, abdominal pain, diarrhea, constipation, blood in stool, abdominal distention and anal bleeding.  Genitourinary: Negative for hematuria, vaginal bleeding and difficulty urinating.  Musculoskeletal: Negative for arthralgias.  Skin: Negative for rash and wound.  Neurological: Negative for seizures, syncope and headaches.  Hematological: Negative for adenopathy. Does not bruise/bleed easily.   Psychiatric/Behavioral: Negative for confusion.    Blood pressure 126/80, pulse 76, temperature 97.5 F (36.4 C), height 5\' 4"  (1.626 m), weight 147 lb (66.679 kg), unknown if currently breastfeeding.  Physical Exam Physical Exam  Constitutional: She is oriented to person, place, and time. She appears well-developed and well-nourished. No distress.  Eyes: EOM are normal.  Neck: Neck supple. No JVD present. No tracheal deviation present. No thyromegaly present.  Cardiovascular: Normal rate, regular rhythm, normal heart sounds and intact distal pulses.   No murmur heard. Pulmonary/Chest: Effort normal and breath sounds normal. No respiratory distress. She has no wheezes. She has no rales. She exhibits no tenderness.  Bilateral breast exam is essentially normal. The left nipple is a little bit more flattened than the right, but soft.  There is no edema or erythema. There is no palpable mass or tenderness. I cannot elicit drainage from the left nipple. No axillary adenopathy.  Abdominal: Soft. Bowel sounds are normal. She exhibits no distension and no mass. There is no tenderness. There is no rebound and no guarding.  Musculoskeletal: She exhibits no edema and no tenderness.  Lymphadenopathy:    She has no cervical adenopathy.  Neurological: She is alert and oriented to person, place, and time. She exhibits normal muscle tone. Coordination normal.  Skin: Skin is warm. No rash noted. She is not diaphoretic. No erythema. No pallor.  Psychiatric: She has a normal mood and affect. Her behavior is normal. Judgment and thought content normal.    Data Reviewed She has had 3 separate ultrasound.  Assessment    Suspect low-grade bacterial infection of left breast. This seems to have responded to Keflex although there was a clinical history suggesting a recurrence. This has now resolved spontaneously. As there is no evidence of active infection or mass, I do not think that surgical intervention is  indicated at this time.     Plan    No indication for surgery or antibiotics  Return to see me if this flares again so I can examine her and ultrasound in the office.       Kerin Cecchi M 11/20/2013, 9:19 AM

## 2014-01-19 ENCOUNTER — Inpatient Hospital Stay (HOSPITAL_COMMUNITY)
Admission: AD | Admit: 2014-01-19 | Discharge: 2014-01-20 | Disposition: A | Discharge status: Home / Self Care | Payer: 59 | Source: Ambulatory Visit | Attending: Family Medicine | Admitting: Family Medicine

## 2014-01-19 ENCOUNTER — Encounter (HOSPITAL_COMMUNITY): Payer: Self-pay | Admitting: *Deleted

## 2014-01-19 DIAGNOSIS — Z202 Contact with and (suspected) exposure to infections with a predominantly sexual mode of transmission: Secondary | ICD-10-CM | POA: Diagnosis not present

## 2014-01-19 DIAGNOSIS — N949 Unspecified condition associated with female genital organs and menstrual cycle: Secondary | ICD-10-CM | POA: Diagnosis not present

## 2014-01-19 DIAGNOSIS — R52 Pain, unspecified: Secondary | ICD-10-CM | POA: Diagnosis not present

## 2014-01-19 DIAGNOSIS — R109 Unspecified abdominal pain: Secondary | ICD-10-CM | POA: Insufficient documentation

## 2014-01-19 DIAGNOSIS — Z87891 Personal history of nicotine dependence: Secondary | ICD-10-CM | POA: Diagnosis not present

## 2014-01-19 DIAGNOSIS — T7421XA Adult sexual abuse, confirmed, initial encounter: Secondary | ICD-10-CM

## 2014-01-19 LAB — CBC WITH DIFFERENTIAL/PLATELET
BASOS PCT: 0 % (ref 0–1)
Basophils Absolute: 0 10*3/uL (ref 0.0–0.1)
EOS ABS: 0.1 10*3/uL (ref 0.0–0.7)
EOS PCT: 1 % (ref 0–5)
HCT: 42 % (ref 36.0–46.0)
HEMOGLOBIN: 14.1 g/dL (ref 12.0–15.0)
Lymphocytes Relative: 38 % (ref 12–46)
Lymphs Abs: 3.6 10*3/uL (ref 0.7–4.0)
MCH: 28.4 pg (ref 26.0–34.0)
MCHC: 33.6 g/dL (ref 30.0–36.0)
MCV: 84.7 fL (ref 78.0–100.0)
MONOS PCT: 9 % (ref 3–12)
Monocytes Absolute: 0.8 10*3/uL (ref 0.1–1.0)
NEUTROS PCT: 52 % (ref 43–77)
Neutro Abs: 4.8 10*3/uL (ref 1.7–7.7)
Platelets: 293 10*3/uL (ref 150–400)
RBC: 4.96 MIL/uL (ref 3.87–5.11)
RDW: 16 % — ABNORMAL HIGH (ref 11.5–15.5)
WBC: 9.3 10*3/uL (ref 4.0–10.5)

## 2014-01-19 LAB — POCT PREGNANCY, URINE: Preg Test, Ur: NEGATIVE

## 2014-01-19 NOTE — MAU Note (Signed)
Pt reports lower abd pain and generalized body aches today. Nausea but no vomiting.
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
## 2014-01-20 DIAGNOSIS — Z202 Contact with and (suspected) exposure to infections with a predominantly sexual mode of transmission: Secondary | ICD-10-CM

## 2014-01-20 LAB — WET PREP, GENITAL
TRICH WET PREP: NONE SEEN
YEAST WET PREP: NONE SEEN

## 2014-01-20 LAB — URINALYSIS, ROUTINE W REFLEX MICROSCOPIC
Glucose, UA: NEGATIVE mg/dL
Hgb urine dipstick: NEGATIVE
KETONES UR: 15 mg/dL — AB
Leukocytes, UA: NEGATIVE
Nitrite: NEGATIVE
PH: 6 (ref 5.0–8.0)
Protein, ur: NEGATIVE mg/dL
Specific Gravity, Urine: 1.025 (ref 1.005–1.030)
Urobilinogen, UA: 1 mg/dL (ref 0.0–1.0)

## 2014-01-20 LAB — HIV ANTIBODY (ROUTINE TESTING W REFLEX): HIV: NONREACTIVE

## 2014-01-20 LAB — HEPATITIS C ANTIBODY (REFLEX): HCV Ab: NEGATIVE

## 2014-01-20 MED ORDER — ONDANSETRON 8 MG PO TBDP: 8.0000 mg | ORAL_TABLET | ORAL | Status: DC

## 2014-01-20 MED ORDER — METRONIDAZOLE 500 MG PO TABS: 2000.0000 mg | ORAL_TABLET | ORAL | Status: DC

## 2014-01-20 MED ORDER — METRONIDAZOLE 500 MG PO TABS: ORAL_TABLET | ORAL | Status: DC

## 2014-01-20 MED ORDER — AZITHROMYCIN 500 MG PO TABS: 1000.0000 mg | ORAL_TABLET | Freq: Once | ORAL | Status: DC

## 2014-01-20 MED ORDER — AZITHROMYCIN 250 MG PO TABS: 1000.0000 mg | ORAL_TABLET | ORAL | Status: DC

## 2014-01-20 MED ORDER — CEFTRIAXONE SODIUM 250 MG IJ SOLR
250.0000 mg | INTRAMUSCULAR | Status: AC
  Administered 2014-01-20: 250 mg via INTRAMUSCULAR
  Filled 2014-01-20: qty 250

## 2014-01-20 MED ORDER — ONDANSETRON HCL 4 MG PO TABS: ORAL_TABLET | ORAL | Status: DC

## 2014-01-20 NOTE — Discharge Instructions (Signed)
Sexual Assault or Rape  Sexual assault is any sexual activity that a person is forced, threatened, or coerced into participating in. It may or may not involve physical contact. You are being sexually abused if you are forced to have sexual contact of any kind. Sexual assault is called rape if penetration has occurred (vaginal, oral, or anal). Many times, sexual assaults are committed by a friend, relative, or associate. Sexual assault and rape are never the victim's fault.   Sexual assault can result in various health problems for the person who was assaulted. Some of these problems include:  · Physical injuries in the genital area or other areas of the body.  · Risk of unwanted pregnancy.  · Risk of sexually transmitted infections (STIs).  · Psychological problems such as anxiety, depression, or posttraumatic stress disorder.  WHAT STEPS SHOULD BE TAKEN AFTER A SEXUAL ASSAULT?  If you have been sexually assaulted, you should take the following steps as soon as possible:  · Go to a safe area as quickly as possible and call your local emergency services (911 in [place] Get away from the area where you have been attacked.    · Do not wash, shower, comb your hair, or clean any part of your body.    · Do not change your clothes.    · Do not remove or touch anything in the area where you were assaulted.    · Go to an emergency room for a complete physical exam. Get the necessary tests to protect yourself from STIs or pregnancy. You may be treated for an STI even if no signs of one are present. Emergency contraceptive medicines are also available to help prevent pregnancy, if this is desired. You may need to be examined by a specially trained health care provider.  · Have the health care provider collect evidence during the exam, even if you are not sure if you will file a report with the police.  · Find out how to file the correct papers with the authorities. This is important for all assaults, even if they were committed  by a family member or friend.  · Find out where you can get additional help and support, such as a local rape crisis center.  · Follow up with your health care provider as directed.    HOW CAN YOU REDUCE THE CHANCES OF SEXUAL ASSAULT?  Take the following steps to help reduce your chances of being sexually assaulted:  · Consider carrying mace or pepper spray for protection against an attacker.    · Consider taking a self-defense course.  · Do not try to fight off an attacker if he or she has a gun or knife.    · Be aware of your surroundings, what is happening around you, and who might be there.    · Be assertive, trust your instincts, and walk with confidence and direction.  · Be careful not to drink too much alcohol or use other intoxicants. These can reduce your ability to fight off an assault.  · Always lock your doors and windows. Be sure to have high-quality locks for your home.    · Do not let people enter your house if you do not know them.    · Get a home security system that has a siren if you are able.    · Protect the keys to your house and car. Do not lend them out. Do not put your name and address on them. If you lose them, get your locks changed.    · Always   lock your car and have your key ready to open the door before approaching the car.    · Park in a well-lit and busy area.  · Plan your driving routes so that you travel on well-lit and frequently used streets.   · Keep your car serviced. Always have at least half a tank of gas in it.    · Do not go into isolated areas alone. This includes open garages, empty buildings or offices, or public laundry rooms.    · Do not walk or jog alone, especially when it is dark.    · Never hitchhike.    · If your car breaks down, call the police for help on your cell phone and stay inside the car with your doors locked and windows up.    · If you are being followed, go to a busy area and call for help.    · If you are stopped by a police officer, especially one in  an unmarked police car, keep your door locked. Do not put your window down all the way. Ask the officer to show you identification first.    · Be aware of "date rape drugs" that can be placed in a drink when you are not looking. These drugs can make you unable to fight off an assault.  FOR MORE INFORMATION  · Office on Women's Health, [place] Department of Health and Human Services: www.womenshealth.gov/violence-against-women/types-of-violence/sexual-assault-and-abuse.html  · National Sexual Assault Hotline: 1-800-656-HOPE (4673)  · National Domestic Violence Hotline: 1-800-799-SAFE (7233) or www.thehotline.org  Document Released: 04/07/2000 Document Revised: 12/11/2012 Document Reviewed: 09/11/2012  ExitCare® Patient Information ©2015 [employer]. This information is not intended to replace advice given to you by your health care provider. Make sure you discuss any questions you have with your health care provider.
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
## 2014-01-21 LAB — GC/CHLAMYDIA PROBE AMP
CT Probe RNA: NEGATIVE
GC PROBE AMP APTIMA: NEGATIVE

## 2014-01-21 NOTE — MAU Provider Note (Signed)
Attestation of Attending Supervision of Advanced Practitioner (PA/CNM/NP): Evaluation and management procedures were performed by the Advanced Practitioner under my supervision and collaboration.  I have reviewed the Advanced Practitioner's note and chart, and I agree with the management and plan.  Jacob Stinson, DO Attending Physician Faculty Practice, [facility]  

## 2014-02-23 ENCOUNTER — Encounter (HOSPITAL_COMMUNITY): Payer: Self-pay | Admitting: *Deleted

## 2014-03-13 ENCOUNTER — Telehealth: Payer: Self-pay | Admitting: *Deleted

## 2014-03-13 ENCOUNTER — Ambulatory Visit (INDEPENDENT_AMBULATORY_CARE_PROVIDER_SITE_OTHER): Payer: 59 | Admitting: Women's Health

## 2014-03-13 ENCOUNTER — Encounter: Payer: Self-pay | Admitting: Women's Health

## 2014-03-13 VITALS — BP 124/80 | Ht 64.0 in | Wt 145.0 lb

## 2014-03-13 DIAGNOSIS — N6459 Other signs and symptoms in breast: Secondary | ICD-10-CM

## 2014-03-13 DIAGNOSIS — N644 Mastodynia: Secondary | ICD-10-CM

## 2014-03-13 DIAGNOSIS — Z872 Personal history of diseases of the skin and subcutaneous tissue: Secondary | ICD-10-CM

## 2014-03-13 NOTE — Telephone Encounter (Signed)
Orders placed at breast center, they will contact pt to schedule. 

## 2014-03-13 NOTE — Progress Notes (Signed)
Patient ID: [name], female   DOB: February 05, 1986, 28 y.o.   MRN: [id number] Presents with complaint of lump/tenderness in left breast upper inner quadrant for 1 week. Denies injury to breast. History of a left breast abscess cleared by antibiotic approximately 6 months ago. Nipple inversion for the past 6 months on left breast only. Denies abdominal pain, fever, skin changes in the breast. Son is 23-year-old, did not breast-feed. No family history of breast cancer.  Exam: Appears well. Breasts examined sitting and lying position right breast no palpable nodules, skin changes, nipple discharge or tenderness. Left breast in sitting position 2 cm mobile tender smooth nodule upper inner quadrant, nipple inversion, nodule nonpalpable in lying position. No erythema, skin changes, or color changes.  Mastodynia with left breast upper inner quadrant nodule/?fibroadenoma  Plan: Diagnostic mammogram/ultrasound. Vitamin E twice daily.

## 2014-03-13 NOTE — Patient Instructions (Signed)

## 2014-03-13 NOTE — Telephone Encounter (Signed)
-----   Message from Huel Cote, NP sent at 03/13/2014  9:36 AM EST ----- Please schedule diagnostic mammogram,US if needed on left breast, inverted nipple for 6 months, breast tenderness, hx of abscess. Early am best.

## 2014-03-23 NOTE — Telephone Encounter (Signed)
Appointment 03/24/14 @ 8:10 am

## 2014-03-24 ENCOUNTER — Other Ambulatory Visit: Payer: 59

## 2014-03-24 HISTORY — PX: BREAST BIOPSY: SHX20

## 2014-03-27 ENCOUNTER — Ambulatory Visit
Admission: RE | Admit: 2014-03-27 | Discharge: 2014-03-27 | Disposition: A | Discharge status: Home / Self Care | Payer: 59 | Source: Ambulatory Visit | Attending: Women's Health | Admitting: Women's Health

## 2014-03-27 ENCOUNTER — Other Ambulatory Visit: Payer: Self-pay | Admitting: Women's Health

## 2014-03-27 DIAGNOSIS — N6459 Other signs and symptoms in breast: Secondary | ICD-10-CM

## 2014-03-27 DIAGNOSIS — N6320 Unspecified lump in the left breast, unspecified quadrant: Secondary | ICD-10-CM

## 2014-03-27 DIAGNOSIS — Z872 Personal history of diseases of the skin and subcutaneous tissue: Secondary | ICD-10-CM

## 2014-03-27 DIAGNOSIS — N644 Mastodynia: Secondary | ICD-10-CM

## 2014-04-06 ENCOUNTER — Other Ambulatory Visit: Payer: Self-pay | Admitting: Women's Health

## 2014-04-06 ENCOUNTER — Ambulatory Visit
Admission: RE | Admit: 2014-04-06 | Discharge: 2014-04-06 | Disposition: A | Discharge status: Home / Self Care | Payer: 59 | Source: Ambulatory Visit | Attending: Women's Health | Admitting: Women's Health

## 2014-04-06 DIAGNOSIS — N6320 Unspecified lump in the left breast, unspecified quadrant: Secondary | ICD-10-CM

## 2014-04-18 ENCOUNTER — Emergency Department (HOSPITAL_COMMUNITY)
Admission: EM | Admit: 2014-04-18 | Discharge: 2014-04-18 | Disposition: A | Discharge status: Home / Self Care | Payer: 59 | Attending: Emergency Medicine | Admitting: Emergency Medicine

## 2014-04-18 ENCOUNTER — Encounter (HOSPITAL_COMMUNITY): Payer: Self-pay | Admitting: Emergency Medicine

## 2014-04-18 DIAGNOSIS — K5900 Constipation, unspecified: Secondary | ICD-10-CM | POA: Insufficient documentation

## 2014-04-18 DIAGNOSIS — L089 Local infection of the skin and subcutaneous tissue, unspecified: Secondary | ICD-10-CM | POA: Diagnosis present

## 2014-04-18 DIAGNOSIS — Z8619 Personal history of other infectious and parasitic diseases: Secondary | ICD-10-CM | POA: Insufficient documentation

## 2014-04-18 DIAGNOSIS — Z87891 Personal history of nicotine dependence: Secondary | ICD-10-CM | POA: Diagnosis not present

## 2014-04-18 DIAGNOSIS — L0501 Pilonidal cyst with abscess: Secondary | ICD-10-CM

## 2014-04-18 DIAGNOSIS — Z79899 Other long term (current) drug therapy: Secondary | ICD-10-CM | POA: Diagnosis not present

## 2014-04-18 DIAGNOSIS — Z8742 Personal history of other diseases of the female genital tract: Secondary | ICD-10-CM | POA: Diagnosis not present

## 2014-04-18 MED ORDER — OXYCODONE-ACETAMINOPHEN 5-325 MG PO TABS: 1.0000 | ORAL_TABLET | Freq: Once | ORAL | Status: DC

## 2014-04-18 MED ORDER — OXYCODONE-ACETAMINOPHEN 5-325 MG PO TABS: 1.0000 | ORAL_TABLET | Freq: Four times a day (QID) | ORAL | Status: DC | PRN

## 2014-04-18 MED ORDER — LIDOCAINE-EPINEPHRINE 2 %-1:100000 IJ SOLN
20.0000 mL | Freq: Once | INTRAMUSCULAR | Status: AC
  Administered 2014-04-18: 20 mL
  Filled 2014-04-18: qty 1

## 2014-04-18 MED ORDER — ONDANSETRON 4 MG PO TBDP: 4.0000 mg | ORAL_TABLET | Freq: Once | ORAL | Status: DC

## 2014-04-18 MED ORDER — METRONIDAZOLE 500 MG PO TABS: 500.0000 mg | ORAL_TABLET | Freq: Two times a day (BID) | ORAL | Status: DC

## 2014-04-18 MED ORDER — CIPROFLOXACIN HCL 500 MG PO TABS: 500.0000 mg | ORAL_TABLET | Freq: Two times a day (BID) | ORAL | Status: DC

## 2014-04-18 NOTE — ED Provider Notes (Signed)
CSN: [id number]     Arrival date & time 04/18/14  1323 History  This chart was scribed for non-physician practitioner Delos Haring, PA-C working with Hoy Morn, MD by Lora Havens, ED Scribe. This patient was seen in [facility] and the patient's care was started at 2:26 PM.   Chief Complaint  Patient presents with  . Recurrent Skin Infections   The history is provided by the patient. No language interpreter was used.    HPI Comments: [name] is a 28 y.o. female who presents to the Emergency Department complaining of abscess on the superior gluteal fold, onset 2 days ago. The pain has gradually worsened since onset. She has had one other incident of this complaint. She denies difficulty with bowel movements. She denies fever, vomiting and abdominal pain  Past Medical History  Diagnosis Date  . Fibroadenoma of breast     LEFT  . LGSIL (low grade squamous intraepithelial dysplasia) 12/2008, 02/2011    C&B WITH LGSIL 02/2011  . IBS (irritable bowel syndrome)   . Elevated prolactin level     30 range 12/2008, normal X multiple repeats, most recent 18 04/2011  . STD (sexually transmitted disease)     History of Chlamydia  . Abnormal Pap smear    Past Surgical History  Procedure Laterality Date  . Induced abortion  [id number]    x2  . Adenoidectomy      ASA CHILD  . Colposcopy    . Dilation and curettage of uterus    . Cesarean section N/A 03/06/2013    Procedure: CESAREAN SECTION;  Surgeon: Melina Schools, MD;  Location: [facility];  Service: Obstetrics;  Laterality: N/A;   Family History  Problem Relation Age of Onset  . Other Brother     TB  . Aneurysm Maternal Grandfather   . Alcohol abuse Neg Hx   . Arthritis Neg Hx   . Asthma Neg Hx   . Birth defects Neg Hx   . Cancer Neg Hx   . COPD Neg Hx   . Depression Neg Hx   . Diabetes Neg Hx   . Drug abuse Neg Hx   . Early death Neg Hx   . Hearing loss Neg Hx   . Heart disease Neg Hx   . Hyperlipidemia Neg Hx    . Hypertension Neg Hx   . Kidney disease Neg Hx   . Learning disabilities Neg Hx   . Mental illness Neg Hx   . Mental retardation Neg Hx   . Miscarriages / Stillbirths Neg Hx   . Vision loss Neg Hx   . Varicose Veins Neg Hx    History  Substance Use Topics  . Smoking status: Former Smoker -- 3 years    Types: Cigarettes    Quit date: 04/12/2010  . Smokeless tobacco: Never Used     Comment: Maybe 1 cigarette every 2 days  . Alcohol Use: 0.0 oz/week    0 Not specified per week     Comment: Occasional   OB History    Gravida Para Term Preterm AB TAB SAB Ectopic Multiple Living   5 3 3  2 1 1   2      Review of Systems  Constitutional: Negative for fever.  Gastrointestinal: Positive for constipation. Negative for vomiting and abdominal pain.  Skin:       Abscess on the gluteal fold  All other systems reviewed and are negative.  Allergies  Banana  Home Medications  Prior to Admission medications   Medication Sig Start Date End Date Taking? Authorizing Provider  etonogestrel (NEXPLANON) 68 MG IMPL implant Inject 1 each into the skin once.   Yes Historical Provider, MD  ciprofloxacin (CIPRO) 500 MG tablet Take 1 tablet (500 mg total) by mouth 2 (two) times daily. 04/18/14   Tayte Mcwherter Marilu Favre, PA-C  metroNIDAZOLE (FLAGYL) 500 MG tablet Take 1 tablet (500 mg total) by mouth 2 (two) times daily. 04/18/14   Amena Dockham Marilu Favre, PA-C  oxyCODONE-acetaminophen (PERCOCET/ROXICET) 5-325 MG per tablet Take 1 tablet by mouth every 6 (six) hours as needed for severe pain. 04/18/14   Jodell Weitman Marilu Favre, PA-C   BP 114/62 mmHg  Pulse 84  Temp(Src) 98.8 F (37.1 C) (Oral)  Resp 16  SpO2 100% Physical Exam  Constitutional: She is oriented to person, place, and time. She appears well-developed and well-nourished. No distress.  HENT:  Head: Normocephalic and atraumatic.  Eyes: EOM are normal.  Neck: Normal range of motion.  Cardiovascular: Normal rate.   Pulmonary/Chest: Effort normal.   Abdominal: Bowel sounds are normal. She exhibits no distension. There is no tenderness. There is no rigidity, no rebound and no guarding.  Genitourinary: Rectum normal.  + Pilonidal cyst with abscess and cellulitis  Neurological: She is alert and oriented to person, place, and time.  Skin: Skin is warm and dry.  Psychiatric: She has a normal mood and affect. Her behavior is normal.  Nursing note and vitals reviewed.   ED Course  Procedures  DIAGNOSTIC STUDIES: Oxygen Saturation is 100% on room air, normal by my interpretation.    COORDINATION OF CARE: 2:31 PM Discussed treatment plan with pt at bedside and pt agreed to plan.  Labs Review Labs Reviewed - No data to display  Imaging Review No results found.   EKG Interpretation None      MDM   Final diagnoses:  Pilonidal cyst with abscess    INCISION AND DRAINAGE Performed by: Linus Mako Consent: Verbal consent obtained. Risks and benefits: risks, benefits and alternatives were discussed Type: abscess  Body area: pilonidal cyst with abscess  Anesthesia: local infiltration  Incision was made with a scalpel.  Local anesthetic: lidocaine 2% with epinephrine  Anesthetic total: 3 ml  Complexity: complex Blunt dissection to break up loculations  Drainage: purulent  Drainage amount: 10 cc   Patient tolerance: Patient tolerated the procedure well with no immediate complications.  Patient needs to see surgeon for pilonidal cyst. Uptodate recommends cipro/flagyl for pilonidal cyst with abscess. I have written these as well as Percocet for pain.  28 y.o.[name]'s evaluation in the Emergency Department is complete. It has been determined that no acute conditions requiring further emergency intervention are present at this time. The patient/guardian have been advised of the diagnosis and plan. We have discussed signs and symptoms that warrant return to the ED, such as changes or worsening in  symptoms.  Vital signs are stable at discharge. Filed Vitals:   04/18/14 1329  BP: 114/62  Pulse: 84  Temp: 98.8 F (37.1 C)  Resp: 16    Patient/guardian has voiced understanding and agreed to follow-up with the PCP or specialist.   I personally performed the services described in this documentation, which was scribed in my presence. The recorded information has been reviewed and is accurate.     Linus Mako, PA-C 04/18/14 [place], MD 04/18/14 [phone]

## 2014-04-18 NOTE — ED Notes (Signed)
Pt c/o boil on lower back/upper buttock area that came up yesterday. Pt denies any drainage.

## 2014-04-18 NOTE — Discharge Instructions (Signed)
Incision and Drainage Incision and drainage is a procedure in which a sac-like structure (cystic structure) is opened and drained. The area to be drained usually contains material such as pus, fluid, or blood.  LET YOUR CAREGIVER KNOW ABOUT:   Allergies to medicine.  Medicines taken, including vitamins, herbs, eyedrops, over-the-counter medicines, and creams.  Use of steroids (by mouth or creams).  Previous problems with anesthetics or numbing medicines.  History of bleeding problems or blood clots.  Previous surgery.  Other health problems, including diabetes and kidney problems.  Possibility of pregnancy, if this applies. RISKS AND COMPLICATIONS  Pain.  Bleeding.  Scarring.  Infection. BEFORE THE PROCEDURE  You may need to have an ultrasound or other imaging tests to see how large or deep your cystic structure is. Blood tests may also be used to determine if you have an infection or how severe the infection is. You may need to have a tetanus shot. PROCEDURE  The affected area is cleaned with a cleaning fluid. The cyst area will then be numbed with a medicine (local anesthetic). A small incision will be made in the cystic structure. A syringe or catheter may be used to drain the contents of the cystic structure, or the contents may be squeezed out. The area will then be flushed with a cleansing solution. After cleansing the area, it is often gently packed with a gauze or another wound dressing. Once it is packed, it will be covered with gauze and tape or some other type of wound dressing. AFTER THE PROCEDURE   Often, you will be allowed to go home right after the procedure.  You may be given antibiotic medicine to prevent or heal an infection.  If the area was packed with gauze or some other wound dressing, you will likely need to come back in 1 to 2 days to get it removed.  The area should heal in about 14 days. Document Released: 10/04/2000 Document Revised: 10/10/2011  Document Reviewed: 06/05/2011 [facility] Patient Information 2015 [place], [place]. This information is not intended to replace advice given to you by your health care provider. Make sure you discuss any questions you have with your health care provider.  Pilonidal Cyst A pilonidal cyst occurs when hairs get trapped (ingrown) beneath the skin in the crease between the buttocks over your sacrum (the bone under that crease). Pilonidal cysts are most common in young men with a lot of body hair. When the cyst is ruptured (breaks) or leaking, fluid from the cyst may cause burning and itching. If the cyst becomes infected, it causes a painful swelling filled with pus (abscess). The pus and trapped hairs need to be removed (often by lancing) so that the infection can heal. However, recurrence is common and an operation may be needed to remove the cyst. HOME CARE INSTRUCTIONS   If the cyst was NOT INFECTED:  Keep the area clean and dry. Bathe or shower daily. Wash the area well with a germ-killing soap. Warm tub baths may help prevent infection and help with drainage. Dry the area well with a towel.  Avoid tight clothing to keep area as moisture free as possible.  Keep area between buttocks as free of hair as possible. A depilatory may be used.  If the cyst WAS INFECTED and needed to be drained:  Your caregiver packed the wound with gauze to keep the wound open. This allows the wound to heal from the inside outwards and continue draining.  Return for a wound check  in 1 day or as suggested.  If you take tub baths or showers, repack the wound with gauze following them. Sponge baths (at the sink) are a good alternative.  If an antibiotic was ordered to fight the infection, take as directed.  Only take over-the-counter or prescription medicines for pain, discomfort, or fever as directed by your caregiver.  After the drain is removed, use sitz baths for 20 minutes 4 times per day. Clean the wound gently  with mild unscented soap, pat dry, and then apply a dry dressing. SEEK MEDICAL CARE IF:   You have increased pain, swelling, redness, drainage, or bleeding from the area.  You have a fever.  You have muscles aches, dizziness, or a general ill feeling. Document Released: 04/07/2000 Document Revised: 07/03/2011 Document Reviewed: 06/05/2008 [facility] Patient Information 2015 [place], [place]. This information is not intended to replace advice given to you by your health care provider. Make sure you discuss any questions you have with your health care provider.

## 2014-04-21 ENCOUNTER — Telehealth (HOSPITAL_BASED_OUTPATIENT_CLINIC_OR_DEPARTMENT_OTHER): Payer: Self-pay | Admitting: Emergency Medicine

## 2014-04-24 DIAGNOSIS — Z22322 Carrier or suspected carrier of Methicillin resistant Staphylococcus aureus: Secondary | ICD-10-CM

## 2014-04-24 HISTORY — DX: Carrier or suspected carrier of methicillin resistant Staphylococcus aureus: Z22.322

## 2014-05-11 ENCOUNTER — Ambulatory Visit (INDEPENDENT_AMBULATORY_CARE_PROVIDER_SITE_OTHER): Payer: 59 | Admitting: Gynecology

## 2014-05-11 ENCOUNTER — Other Ambulatory Visit (HOSPITAL_COMMUNITY)
Admission: RE | Admit: 2014-05-11 | Discharge: 2014-05-11 | Disposition: A | Discharge status: Home / Self Care | Payer: 59 | Source: Ambulatory Visit | Attending: Gynecology | Admitting: Gynecology

## 2014-05-11 ENCOUNTER — Encounter: Payer: Self-pay | Admitting: Gynecology

## 2014-05-11 VITALS — BP 120/76 | Ht 65.0 in | Wt 136.0 lb

## 2014-05-11 DIAGNOSIS — N764 Abscess of vulva: Secondary | ICD-10-CM

## 2014-05-11 DIAGNOSIS — D242 Benign neoplasm of left breast: Secondary | ICD-10-CM

## 2014-05-11 DIAGNOSIS — Z01419 Encounter for gynecological examination (general) (routine) without abnormal findings: Secondary | ICD-10-CM

## 2014-05-11 DIAGNOSIS — Z113 Encounter for screening for infections with a predominantly sexual mode of transmission: Secondary | ICD-10-CM

## 2014-05-11 LAB — CBC WITH DIFFERENTIAL/PLATELET
Basophils Absolute: 0 10*3/uL (ref 0.0–0.1)
Basophils Relative: 0 % (ref 0–1)
Eosinophils Absolute: 0.2 10*3/uL (ref 0.0–0.7)
Eosinophils Relative: 2 % (ref 0–5)
HEMATOCRIT: 40.1 % (ref 36.0–46.0)
Hemoglobin: 13 g/dL (ref 12.0–15.0)
Lymphocytes Relative: 26 % (ref 12–46)
Lymphs Abs: 2 10*3/uL (ref 0.7–4.0)
MCH: 27.3 pg (ref 26.0–34.0)
MCHC: 32.4 g/dL (ref 30.0–36.0)
MCV: 84.2 fL (ref 78.0–100.0)
MPV: 9 fL (ref 8.6–12.4)
Monocytes Absolute: 0.8 10*3/uL (ref 0.1–1.0)
Monocytes Relative: 11 % (ref 3–12)
Neutro Abs: 4.6 10*3/uL (ref 1.7–7.7)
Neutrophils Relative %: 61 % (ref 43–77)
Platelets: 283 10*3/uL (ref 150–400)
RBC: 4.76 MIL/uL (ref 3.87–5.11)
RDW: 14.7 % (ref 11.5–15.5)
WBC: 7.6 10*3/uL (ref 4.0–10.5)

## 2014-05-11 LAB — HM PAP SMEAR: HM PAP: NORMAL

## 2014-05-11 LAB — HIV ANTIBODY (ROUTINE TESTING W REFLEX): HIV 1&2 Ab, 4th Generation: NONREACTIVE

## 2014-05-11 LAB — HEPATITIS B SURFACE ANTIGEN: HEP B S AG: NEGATIVE

## 2014-05-11 LAB — HEPATITIS C ANTIBODY: HCV AB: NEGATIVE

## 2014-05-11 LAB — RPR

## 2014-05-11 NOTE — Addendum Note (Signed)
Addended by: Nelva Nay on: 05/11/2014 09:38 AM   Modules accepted: Orders, SmartSet

## 2014-05-11 NOTE — Patient Instructions (Signed)
Use warm soaks to your perineum. Remove the packing by tomorrow. Call if worsening symptoms, fever or chills or any questions.  Vulvar Abscess  The vulva is made up of the large and small flaps of skin around the vagina opening. A vulvar abscess is an infected sac of yellowish white fluid (pus) in the skin flaps. Your doctor may make a small cut in the skin to drain the vulvar abscess.  HOME CARE  Only take medicine as told by your doctor.  Soak or take a warm water bath (sitz bath) 3 to 4 times a day for 15 to 20 minutes.  After you pee (urinate), always wipe from front to back.  Clean the vulvar abscess with soap and warm water. Do this after going to the bathroom.  Wear loose-fitting clothing.  Do not have sex until the vulvar abscess is gone or as told by your doctor. GET HELP RIGHT AWAY IF:   You have a temperature by mouth above 102 F (38.9 C).  The vulva area becomes more painful, puffy (swollen), or red.  You have fluid coming from the vulva area that is red or tan.  You have pain when you pee or have a hard time peeing. MAKE SURE YOU:  Understand these instructions.  Will watch your condition.  Will get help if you are not doing well or get worse. Document Released: 07/07/2008 Document Revised: 07/03/2011 Document Reviewed: 07/07/2008 [facility] Patient Information 2015 [place], [place]. This information is not intended to replace advice given to you by your health care provider. Make sure you discuss any questions you have with your health care provider.   You may obtain a copy of any labs that were done today by logging onto MyChart as outlined in the instructions provided with your AVS (after visit summary). The office will not call with normal lab results but certainly if there are any significant abnormalities then we will contact you.   Health Maintenance, Female A healthy lifestyle and preventative care can promote health and wellness.  Maintain regular  health, dental, and eye exams.  Eat a healthy diet. Foods like vegetables, fruits, whole grains, low-fat dairy products, and lean protein foods contain the nutrients you need without too many calories. Decrease your intake of foods high in solid fats, added sugars, and salt. Get information about a proper diet from your caregiver, if necessary.  Regular physical exercise is one of the most important things you can do for your health. Most adults should get at least 150 minutes of moderate-intensity exercise (any activity that increases your heart rate and causes you to sweat) each week. In addition, most adults need muscle-strengthening exercises on 2 or more days a week.   Maintain a healthy weight. The body mass index (BMI) is a screening tool to identify possible weight problems. It provides an estimate of body fat based on height and weight. Your caregiver can help determine your BMI, and can help you achieve or maintain a healthy weight. For adults 20 years and older:  A BMI below 18.5 is considered underweight.  A BMI of 18.5 to 24.9 is normal.  A BMI of 25 to 29.9 is considered overweight.  A BMI of 30 and above is considered obese.  Maintain normal blood lipids and cholesterol by exercising and minimizing your intake of saturated fat. Eat a balanced diet with plenty of fruits and vegetables. Blood tests for lipids and cholesterol should begin at age 61 and be repeated every 5 years. If  your lipid or cholesterol levels are high, you are over 50, or you are a high risk for heart disease, you may need your cholesterol levels checked more frequently.Ongoing high lipid and cholesterol levels should be treated with medicines if diet and exercise are not effective.  If you smoke, find out from your caregiver how to quit. If you do not use tobacco, do not start.  Lung cancer screening is recommended for adults aged 44 80 years who are at high risk for developing lung cancer because of a history  of smoking. Yearly low-dose computed tomography (CT) is recommended for people who have at least a 30-pack-year history of smoking and are a current smoker or have quit within the past 15 years. A pack year of smoking is smoking an average of 1 pack of cigarettes a day for 1 year (for example: 1 pack a day for 30 years or 2 packs a day for 15 years). Yearly screening should continue until the smoker has stopped smoking for at least 15 years. Yearly screening should also be stopped for people who develop a health problem that would prevent them from having lung cancer treatment.  If you are pregnant, do not drink alcohol. If you are breastfeeding, be very cautious about drinking alcohol. If you are not pregnant and choose to drink alcohol, do not exceed 1 drink per day. One drink is considered to be 12 ounces (355 mL) of beer, 5 ounces (148 mL) of wine, or 1.5 ounces (44 mL) of liquor.  Avoid use of street drugs. Do not share needles with anyone. Ask for help if you need support or instructions about stopping the use of drugs.  High blood pressure causes heart disease and increases the risk of stroke. Blood pressure should be checked at least every 1 to 2 years. Ongoing high blood pressure should be treated with medicines, if weight loss and exercise are not effective.  If you are 29 to 27 years old, ask your caregiver if you should take aspirin to prevent strokes.  Diabetes screening involves taking a blood sample to check your fasting blood sugar level. This should be done once every 3 years, after age 34, if you are within normal weight and without risk factors for diabetes. Testing should be considered at a younger age or be carried out more frequently if you are overweight and have at least 1 risk factor for diabetes.  Breast cancer screening is essential preventative care for women. You should practice "breast self-awareness." This means understanding the normal appearance and feel of your breasts  and may include breast self-examination. Any changes detected, no matter how small, should be reported to a caregiver. Women in their 61s and 30s should have a clinical breast exam (CBE) by a caregiver as part of a regular health exam every 1 to 3 years. After age 58, women should have a CBE every year. Starting at age 96, women should consider having a mammogram (breast X-ray) every year. Women who have a family history of breast cancer should talk to their caregiver about genetic screening. Women at a high risk of breast cancer should talk to their caregiver about having an MRI and a mammogram every year.  Breast cancer gene (BRCA)-related cancer risk assessment is recommended for women who have family members with BRCA-related cancers. BRCA-related cancers include breast, ovarian, tubal, and peritoneal cancers. Having family members with these cancers may be associated with an increased risk for harmful changes (mutations) in the breast cancer genes  BRCA1 and BRCA2. Results of the assessment will determine the need for genetic counseling and BRCA1 and BRCA2 testing.  The Pap test is a screening test for cervical cancer. Women should have a Pap test starting at age 50. Between ages 81 and 78, Pap tests should be repeated every 2 years. Beginning at age 31, you should have a Pap test every 3 years as long as the past 3 Pap tests have been normal. If you had a hysterectomy for a problem that was not cancer or a condition that could lead to cancer, then you no longer need Pap tests. If you are between ages 58 and 83, and you have had normal Pap tests going back 10 years, you no longer need Pap tests. If you have had past treatment for cervical cancer or a condition that could lead to cancer, you need Pap tests and screening for cancer for at least 20 years after your treatment. If Pap tests have been discontinued, risk factors (such as a new sexual partner) need to be reassessed to determine if screening should  be resumed. Some women have medical problems that increase the chance of getting cervical cancer. In these cases, your caregiver may recommend more frequent screening and Pap tests.  The human papillomavirus (HPV) test is an additional test that may be used for cervical cancer screening. The HPV test looks for the virus that can cause the cell changes on the cervix. The cells collected during the Pap test can be tested for HPV. The HPV test could be used to screen women aged 13 years and older, and should be used in women of any age who have unclear Pap test results. After the age of 43, women should have HPV testing at the same frequency as a Pap test.  Colorectal cancer can be detected and often prevented. Most routine colorectal cancer screening begins at the age of 57 and continues through age 52. However, your caregiver may recommend screening at an earlier age if you have risk factors for colon cancer. On a yearly basis, your caregiver may provide home test kits to check for hidden blood in the stool. Use of a small camera at the end of a tube, to directly examine the colon (sigmoidoscopy or colonoscopy), can detect the earliest forms of colorectal cancer. Talk to your caregiver about this at age 73, when routine screening begins. Direct examination of the colon should be repeated every 5 to 10 years through age 26, unless early forms of pre-cancerous polyps or small growths are found.  Hepatitis C blood testing is recommended for all people born from [birth year] through 1963 and any individual with known risks for hepatitis C.  Practice safe sex. Use condoms and avoid high-risk sexual practices to reduce the spread of sexually transmitted infections (STIs). Sexually active women aged 21 and younger should be checked for Chlamydia, which is a common sexually transmitted infection. Older women with new or multiple partners should also be tested for Chlamydia. Testing for other STIs is recommended if you are  sexually active and at increased risk.  Osteoporosis is a disease in which the bones lose minerals and strength with aging. This can result in serious bone fractures. The risk of osteoporosis can be identified using a bone density scan. Women ages 48 and over and women at risk for fractures or osteoporosis should discuss screening with their caregivers. Ask your caregiver whether you should be taking a calcium supplement or vitamin D to reduce the rate of  osteoporosis.  Menopause can be associated with physical symptoms and risks. Hormone replacement therapy is available to decrease symptoms and risks. You should talk to your caregiver about whether hormone replacement therapy is right for you.  Use sunscreen. Apply sunscreen liberally and repeatedly throughout the day. You should seek shade when your shadow is shorter than you. Protect yourself by wearing long sleeves, pants, a wide-brimmed hat, and sunglasses year round, whenever you are outdoors.  Notify your caregiver of new moles or changes in moles, especially if there is a change in shape or color. Also notify your caregiver if a mole is larger than the size of a pencil eraser.  Stay current with your immunizations. Document Released: 10/24/2010 Document Revised: 08/05/2012 Document Reviewed: 10/24/2010 [facility] Patient Information 2014 [place].

## 2014-05-11 NOTE — Progress Notes (Signed)
[place] 09-10-1985 [id number]        28 y.o.  [id number] for annual exam.  Several issues noted below.  Past medical history,surgical history, problem list, medications, allergies, family history and social history were all reviewed and documented as reviewed in the EPIC chart.  ROS:  Performed with pertinent positives and negatives included in the history, assessment and plan.   Additional significant findings :  none   Exam: Kim Counsellor Vitals:   05/11/14 0849  BP: 120/76  Height: 5\' 5"  (1.651 m)  Weight: 136 lb (61.689 kg)   General appearance:  Normal affect, orientation and appearance. Skin: Grossly normal.  Nexplanon inner upper left arm intact. HEENT: Without gross lesions.  No cervical or supraclavicular adenopathy. Thyroid normal.  Lungs:  Clear without wheezing, rales or rhonchi Cardiac: RR, without RMG Abdominal:  Soft, nontender, without masses, guarding, rebound, organomegaly or hernia Breasts:  Examined lying and sitting. Right without masses, retractions, discharge or axillary adenopathy.  Left with small nodule upper inner breast. Mobile with no overlying skin changes. No nipple discharge or axillary adenopathy Pelvic:  Ext/BUS/vagina with fluctuant 4 cm left labia majora abscess. Tender small left inguinal lymph nodes. No evidence of cellulitis.  Cervix normal. Pap smear done. GC/Chlamydia  Uterus anteverted, normal size, shape and contour, midline and mobile nontender   Adnexa  Without masses or tenderness    Anus and perineum  Normal   Procedure: The skin overlying the most pointing area of the abscess was cleansed with Betadine, infiltrated with 1% lidocaine and using a scalpel the abscess was lanced and drained of purulent material. Culture was taken. Abscess cavity probed with a small force of to break up any loculation. Packing was placed. Patient tolerated well.  Assessment/Plan:  29 y.o. [id number] female for annual exam without menses,  Nexplanon birth control.   1. Left vulvar abscess. Present for several days. Symptoms worsening with a lot of discomfort. No fever or chills or other constitutional symptoms. Options offered to include warm soaks awaiting spontaneous drainage versus I&D now.  Patient prefers to have lanced as above note. Postoperative instructions given with ASAP follow up precautions. 2. Nexplanon 03/2013. Doing well with occasional spotting. Continue to monitor. 3. Fibroadenoma, biopsy proven left breast. Continue to follow and report any new findings on SBE. 4. Pap smear 2014. Pap smear done today. History of LGSIL 2010/2012 with colposcopic biopsy. Continue to follow expectantly. 5. STD screening. Patient requests STD screening. No known exposure. GC/Chlamydia, hepatitis B, hepatitis C, HIV, RPR ordered. 6. Health maintenance. Baseline CBC urine analysis ordered. Follow up 1 year, sooner if any issues.     Anastasio Auerbach MD, 9:23 AM 05/11/2014

## 2014-05-12 ENCOUNTER — Telehealth: Payer: Self-pay | Admitting: *Deleted

## 2014-05-12 LAB — URINALYSIS W MICROSCOPIC + REFLEX CULTURE
BACTERIA UA: NONE SEEN
BILIRUBIN URINE: NEGATIVE
CASTS: NONE SEEN
Glucose, UA: NEGATIVE mg/dL
Hgb urine dipstick: NEGATIVE
KETONES UR: NEGATIVE mg/dL
LEUKOCYTES UA: NEGATIVE
Nitrite: NEGATIVE
PH: 5.5 (ref 5.0–8.0)
Protein, ur: NEGATIVE mg/dL
Specific Gravity, Urine: >1.03 — ABNORMAL HIGH (ref 1.005–1.030)
UROBILINOGEN UA: 0.2 mg/dL (ref 0.0–1.0)

## 2014-05-12 LAB — GC/CHLAMYDIA PROBE AMP
CT PROBE, AMP APTIMA: NEGATIVE
GC Probe RNA: NEGATIVE

## 2014-05-12 LAB — CYTOLOGY - PAP

## 2014-05-12 MED ORDER — IBUPROFEN 800 MG PO TABS: 800.0000 mg | ORAL_TABLET | Freq: Three times a day (TID) | ORAL | Status: DC | PRN

## 2014-05-12 NOTE — Telephone Encounter (Signed)
Pt called to follow up from [place] 05/11/14 states she removing the packing from Left vulvar abscess. Pt said it very painful still, hurts when sitting, even when walking asked what can she do for the pain? No fever, chills, only pain at site. Please advise

## 2014-05-12 NOTE — Telephone Encounter (Signed)
Pt informed, Rx sent. 

## 2014-05-12 NOTE — Telephone Encounter (Signed)
ibuprofen 800 mg every 8 hours #30. Try this and see if it doesn't help. If not then call and will use something stronger.

## 2014-05-13 ENCOUNTER — Telehealth: Payer: Self-pay | Admitting: *Deleted

## 2014-05-13 ENCOUNTER — Encounter: Payer: Self-pay | Admitting: Gynecology

## 2014-05-13 ENCOUNTER — Other Ambulatory Visit: Payer: Self-pay | Admitting: Gynecology

## 2014-05-13 LAB — WOUND CULTURE
GRAM STAIN: NONE SEEN
Gram Stain: NONE SEEN
Gram Stain: NONE SEEN

## 2014-05-13 MED ORDER — DOXYCYCLINE HYCLATE 100 MG PO CAPS: 100.0000 mg | ORAL_CAPSULE | Freq: Two times a day (BID) | ORAL | Status: DC

## 2014-05-13 NOTE — Telephone Encounter (Signed)
Pt was prescribed Vibramycin 100 mg twice daily x 7 days today for infection for MRSA from wound. She went to dentist and was prescribed penicillin for infection as well. Pt asked if okay to proceed with taking both medications? Please advise

## 2014-05-13 NOTE — Telephone Encounter (Signed)
The MRSA will be resistant to penicillin. She may want to call the dentist to see if they are okay with Vibramycin for whatever infection they are treating and if so she can only take that otherwise I would take both antibiotics.

## 2014-05-13 NOTE — Telephone Encounter (Signed)
Pt informed with the below note. 

## 2014-06-18 ENCOUNTER — Ambulatory Visit (INDEPENDENT_AMBULATORY_CARE_PROVIDER_SITE_OTHER): Payer: 59 | Admitting: Internal Medicine

## 2014-06-18 ENCOUNTER — Encounter: Payer: Self-pay | Admitting: Internal Medicine

## 2014-06-18 VITALS — BP 108/70 | HR 68 | Temp 98.4°F | Wt 134.0 lb

## 2014-06-18 DIAGNOSIS — K589 Irritable bowel syndrome without diarrhea: Secondary | ICD-10-CM | POA: Insufficient documentation

## 2014-06-18 DIAGNOSIS — R61 Generalized hyperhidrosis: Secondary | ICD-10-CM | POA: Insufficient documentation

## 2014-06-18 MED ORDER — LINACLOTIDE 145 MCG PO CAPS: 145.0000 ug | ORAL_CAPSULE | Freq: Every day | ORAL | Status: DC

## 2014-06-18 MED ORDER — ALUMINUM CHLORIDE 20 % EX SOLN
Freq: Every day | CUTANEOUS | Status: DC
Start: 2014-06-18 — End: 2015-09-30

## 2014-06-18 NOTE — Progress Notes (Signed)
HPI  Pt presents to the clinic today to establish care and for management of the conditions listed below. She has not had a PCP in many years, but has been seeing GYN.  Flu: does not get them yearly Tetanus: 2014 LMP: spotting, implant Pap Smear: 04/2014 Dentist: as needed  IBS: Constipation type. Last flare about 1 month ago. She was on Linzess but has run out. She would like a refill today.  Extreme Sweating: Under her armpits. She has tried multiple deoderants, even clinical strength but nothing seems to help. It is aggravated by stress.  Past Medical History  Diagnosis Date  . Fibroadenoma of breast     LEFT  . LGSIL (low grade squamous intraepithelial dysplasia) 12/2008, 02/2011    C&B WITH LGSIL 02/2011  . IBS (irritable bowel syndrome)   . Elevated prolactin level     30 range 12/2008, normal X multiple repeats, most recent 18 04/2011  . STD (sexually transmitted disease)     History of Chlamydia  . MRSA (methicillin resistant staph aureus) culture positive 04/2014    vulvar abscess  . Chicken pox     Current Outpatient Prescriptions  Medication Sig Dispense Refill  . etonogestrel (NEXPLANON) 68 MG IMPL implant Inject 1 each into the skin once.    Marland Kitchen ibuprofen (ADVIL,MOTRIN) 800 MG tablet Take 1 tablet (800 mg total) by mouth every 8 (eight) hours as needed. 30 tablet 0   No current facility-administered medications for this visit.    Allergies  Allergen Reactions  . Banana Itching    Family History  Problem Relation Age of Onset  . Other Brother     TB  . Aneurysm Maternal Grandfather   . Alcohol abuse Neg Hx   . Arthritis Neg Hx   . Asthma Neg Hx   . Birth defects Neg Hx   . Cancer Neg Hx   . COPD Neg Hx   . Depression Neg Hx   . Diabetes Neg Hx   . Drug abuse Neg Hx   . Early death Neg Hx   . Hearing loss Neg Hx   . Heart disease Neg Hx   . Hyperlipidemia Neg Hx   . Hypertension Neg Hx   . Kidney disease Neg Hx   . Learning disabilities Neg Hx   .  Mental illness Neg Hx   . Mental retardation Neg Hx   . Miscarriages / Stillbirths Neg Hx   . Vision loss Neg Hx   . Varicose Veins Neg Hx     History   Social History  . Marital Status: Single    Spouse Name: N/A  . Number of Children: 1  . Years of Education: N/A   Occupational History  . 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 Smoking status: Former Smoker -- 3 years    Types: Cigarettes    Quit date: 04/12/2010  . Smokeless tobacco: Never Used     Comment: Maybe 1 cigarette every 2 days  . Alcohol Use: No     Comment: Occasional  . Drug Use: No  . Sexual Activity:    Partners: Male    Museum/gallery curator: , Implant     Comment: 1st intercourse 29 yo-More than 5 partners   Other Topics Concern  . Not on file   Social History Narrative    ROS:  Constitutional: Denies fever, malaise, fatigue, headache or abrupt weight changes.  Respiratory: Denies difficulty  breathing, shortness of breath, cough or sputum production.   Cardiovascular: Denies chest pain, chest tightness, palpitations or swelling in the hands or feet.  Gastrointestinal: Pt reports constipation. Denies abdominal pain, diarrhea or blood in the stool.  Skin: Pt reports excessive sweating. Denies redness, rashes, lesions or ulcercations.  Neurological: Denies dizziness, difficulty with memory, difficulty with speech or problems with balance and coordination.   No other specific complaints in a complete review of systems (except as listed in HPI above).  PE:  BP 108/70 mmHg  Pulse 68  Temp(Src) 98.4 F (36.9 C) (Oral)  Wt 134 lb (60.782 kg)  SpO2 98%  LMP  Wt Readings from Last 3 Encounters:  06/18/14 134 lb (60.782 kg)  05/11/14 136 lb (61.689 kg)  03/13/14 145 lb (65.772 kg)    General: Appears her stated age, well developed, well nourished in NAD. Skin: Underarms moist, + odor, no rashes noted. Cardiovascular: Normal rate and rhythm. S1,S2  noted.  No murmur, rubs or gallops noted. No JVD or BLE edema. No carotid bruits noted. Pulmonary/Chest: Normal effort and positive vesicular breath sounds. No respiratory distress. Abdomen: Soft and nontender. Normal bowel sounds, no bruits noted. No distention or masses noted. Liver, spleen and kidneys non palpable. Neurological: Alert and oriented.  Psychiatric: Mood and affect normal. Behavior is normal. Judgment and thought content normal.     BMET    Component Value Date/Time   NA 140 06/14/2012 1011   K 4.3 06/14/2012 1011   CL 105 06/14/2012 1011   CO2 27 06/14/2012 1011   GLUCOSE 76 06/14/2012 1011   BUN 11 06/14/2012 1011   CREATININE 0.78 06/14/2012 1011   CREATININE 0.56 05/31/2010 2057   CALCIUM 9.3 06/14/2012 1011   GFRNONAA >60 05/31/2010 2057   GFRAA  05/31/2010 2057    >60        The eGFR has been calculated using the MDRD equation. This calculation has not been validated in all clinical situations. eGFR's persistently <60 mL/min signify possible Chronic Kidney Disease.    Lipid Panel     Component Value Date/Time   CHOL 185 06/14/2012 1011   TRIG 56 06/14/2012 1011   HDL 50 06/14/2012 1011   CHOLHDL 3.7 06/14/2012 1011   VLDL 11 06/14/2012 1011   LDLCALC 124* 06/14/2012 1011    CBC    Component Value Date/Time   WBC 7.6 05/11/2014 0925   RBC 4.76 05/11/2014 0925   HGB 13.0 05/11/2014 0925   HCT 40.1 05/11/2014 0925   PLT 283 05/11/2014 0925   MCV 84.2 05/11/2014 0925   MCH 27.3 05/11/2014 0925   MCHC 32.4 05/11/2014 0925   RDW 14.7 05/11/2014 0925   LYMPHSABS 2.0 05/11/2014 0925   MONOABS 0.8 05/11/2014 0925   EOSABS 0.2 05/11/2014 0925   BASOSABS 0.0 05/11/2014 0925    Hgb A1C No results found for: HGBA1C   Assessment and Plan:

## 2014-06-18 NOTE — Assessment & Plan Note (Signed)
Will try drysol- RX provided today If persist or worsens, consider botox injections

## 2014-06-18 NOTE — Assessment & Plan Note (Signed)
Will refill Linzess today Advised her to eat a high fiber diet

## 2014-06-18 NOTE — Patient Instructions (Signed)
Underarm, Excessive Sweating The medical name for excessive sweating under the arms is primary axillary hyperhidrosis. This condition can interfere with regular social interaction. It can have emotional and professional consequences for some people. Botulinum Toxin Type A (Botox) is an approved treatment for severe underarm sweating. This is used only when the problem can not be managed by prescription antiperspirants. Botox is approved for several other purposes. Botox has also been shown to help with excessive sweating of the palms. BEFORE THE PROCEDURE  Before being treated for excessive sweating, patients should be checked for other possible causes of the problem. This avoids treating a symptom which could be caused by some other serious disease that needs a different treatment. PROCEDURE  Botox is a protein produced by the germ (bacterium) Clostridium botulinum. Small doses are injected under the arms to control sweating. These injections stop the release of acetylcholine. Acetylcholine is a Actor which makes you sweat. These injections temporarily block the nerves in the underarm from producing this messenger. The reduction in sweating may last for an average of 7 months and up to 16 months after one injection. RISK AND COMPLICATIONS The most common bad reactions are:  Injection site pain and bleeding.  Sweating in other parts of the body.  Flu-like symptoms.  Headache.  Fever.  Itching.  Anxiety. Rarely, it can cause a generalized paralysis.  The safety and effectiveness of Botox for excessive sweating in other body areas is not known. Botox is a prescription drug. It must be used carefully under medical supervision. It should be used only for approved indications.  Document Released: 06/09/2005 Document Revised: 07/03/2011 Document Reviewed: 04/15/2008 [facility] Patient Information 2015 [place], [place]. This information is not intended to replace advice given to you by  your health care provider. Make sure you discuss any questions you have with your health care provider.

## 2014-08-12 ENCOUNTER — Telehealth: Payer: Self-pay | Admitting: Internal Medicine

## 2014-08-12 NOTE — Telephone Encounter (Signed)
Spoke to pt and she is aware as instructed 

## 2014-08-12 NOTE — Telephone Encounter (Signed)
Pt called in stating that the medicated Deoderant she is using is not working. She states that palms and feet are now sweating.  She is inquiring about what her next options are.  Best call back number is [phone]. Ok to leave voicemail.

## 2014-08-12 NOTE — Telephone Encounter (Signed)
She can see a dermatologist. They may offer her botox injections

## 2014-08-27 ENCOUNTER — Ambulatory Visit (INDEPENDENT_AMBULATORY_CARE_PROVIDER_SITE_OTHER): Payer: 59 | Admitting: Internal Medicine

## 2014-08-27 ENCOUNTER — Ambulatory Visit: Payer: 59 | Admitting: Internal Medicine

## 2014-08-27 ENCOUNTER — Encounter: Payer: Self-pay | Admitting: Internal Medicine

## 2014-08-27 VITALS — BP 110/72 | HR 70 | Temp 98.7°F | Wt 133.0 lb

## 2014-08-27 DIAGNOSIS — R21 Rash and other nonspecific skin eruption: Secondary | ICD-10-CM | POA: Diagnosis not present

## 2014-08-27 DIAGNOSIS — L708 Other acne: Secondary | ICD-10-CM

## 2014-08-27 MED ORDER — PREDNISONE 10 MG PO TABS: ORAL_TABLET | ORAL | Status: DC

## 2014-08-27 MED ORDER — BENZOYL PEROXIDE 2.75 % EX GEL: 1.0000 "application " | Freq: Every day | CUTANEOUS | Status: DC

## 2014-08-27 NOTE — Patient Instructions (Signed)

## 2014-08-27 NOTE — Progress Notes (Signed)
Pre visit review using our clinic review tool, if applicable. No additional management support is needed unless otherwise documented below in the visit note. 

## 2014-08-28 ENCOUNTER — Encounter: Payer: Self-pay | Admitting: Internal Medicine

## 2014-08-28 NOTE — Progress Notes (Signed)
Subjective:    Patient ID: [name], female    DOB: 1985-09-25, 29 y.o.   MRN: [id number]  HPI  Pt presents to the clinic today with c/o a rash on her face, neck and upper chest. This rash started about 2 months ago. It seems to be spreading. The rash is very itchy.  She has not come in contact with anything she is allergic to. She has not changed lotions, soaps or detergents. No one in her household has a similar rash. She has not tried anything OTC.  Review of Systems      Past Medical History  Diagnosis Date  . Fibroadenoma of breast     LEFT  . LGSIL (low grade squamous intraepithelial dysplasia) 12/2008, 02/2011    C&B WITH LGSIL 02/2011  . IBS (irritable bowel syndrome)   . Elevated prolactin level     30 range 12/2008, normal X multiple repeats, most recent 18 04/2011  . STD (sexually transmitted disease)     History of Chlamydia  . MRSA (methicillin resistant staph aureus) culture positive 04/2014    vulvar abscess  . Chicken pox     Current Outpatient Prescriptions  Medication Sig Dispense Refill  . aluminum chloride (DRYSOL) 20 % external solution Apply topically at bedtime. 60 mL 0  . etonogestrel (NEXPLANON) 68 MG IMPL implant Inject 1 each into the skin once.    Marland Kitchen ibuprofen (ADVIL,MOTRIN) 800 MG tablet Take 1 tablet (800 mg total) by mouth every 8 (eight) hours as needed. 30 tablet 0  . Linaclotide (LINZESS) 145 MCG CAPS capsule Take 1 capsule (145 mcg total) by mouth daily. 30 capsule 2  . Benzoyl Peroxide 2.75 % GEL Apply 1 application topically daily. 1 Tube 0  . predniSONE (DELTASONE) 10 MG tablet Take 3 tabs on days 1-2, take 2 tabs on days 3-4, take 1 tab on days 5-6 12 tablet 0   No current facility-administered medications for this visit.    Allergies  Allergen Reactions  . Banana Itching    Family History  Problem Relation Age of Onset  . Other Brother     TB  . Aneurysm Maternal Grandfather   . Alcohol abuse Neg Hx   . Arthritis Neg  Hx   . Asthma Neg Hx   . Birth defects Neg Hx   . Cancer Neg Hx   . COPD Neg Hx   . Depression Neg Hx   . Diabetes Neg Hx   . Drug abuse Neg Hx   . Early death Neg Hx   . Hearing loss Neg Hx   . Heart disease Neg Hx   . Hyperlipidemia Neg Hx   . Hypertension Neg Hx   . Kidney disease Neg Hx   . Learning disabilities Neg Hx   . Mental illness Neg Hx   . Mental retardation Neg Hx   . Miscarriages / Stillbirths Neg Hx   . Vision loss Neg Hx   . Varicose Veins Neg Hx     History   Social History  . Marital Status: Single    Spouse Name: N/A  . Number of Children: 1  . Years of Education: N/A   Occupational History  . 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 Smoking status: Former Smoker -- 3 years    Types: Cigarettes    Quit date: 04/12/2010  . Smokeless tobacco: Never Used     Comment: Maybe 1  cigarette every 2 days  . Alcohol Use: No     Comment: Occasional  . Drug Use: No  . Sexual Activity:    Partners: Male    Museum/gallery curator: , Implant     Comment: 1st intercourse 29 yo-More than 5 partners   Other Topics Concern  . Not on file   Social History Narrative     Constitutional: Denies fever, malaise, fatigue, headache or abrupt weight changes.  Respiratory: Denies difficulty breathing, shortness of breath, cough or sputum production.   Cardiovascular: Denies chest pain, chest tightness, palpitations or swelling in the hands or feet.   Skin: Pt reports rash. Denies redness, lesions or ulcercations.   No other specific complaints in a complete review of systems (except as listed in HPI above).  Objective:   Physical Exam   BP 110/72 mmHg  Pulse 70  Temp(Src) 98.7 F (37.1 C) (Oral)  Wt 133 lb (60.328 kg)  SpO2 98% Wt Readings from Last 3 Encounters:  08/27/14 133 lb (60.328 kg)  06/18/14 134 lb (60.782 kg)  05/11/14 136 lb (61.689 kg)    General: Appears her stated age, well developed, well  nourished in NAD. Skin: Warm, dry and intact. She does have some comeodonic acne and blackheads noted on her face, upper chest and neck. She also has scattered maculopapular lesion in the same areas, they appear to be scaly. They do not blanch. They are not open or draining. Cardiovascular: Normal rate and rhythm. S1,S2 noted.  No murmur, rubs or gallops noted.  Pulmonary/Chest: Normal effort and positive vesicular breath sounds. No respiratory distress. No wheezes, rales or ronchi noted.    BMET    Component Value Date/Time   NA 140 06/14/2012 1011   K 4.3 06/14/2012 1011   CL 105 06/14/2012 1011   CO2 27 06/14/2012 1011   GLUCOSE 76 06/14/2012 1011   BUN 11 06/14/2012 1011   CREATININE 0.78 06/14/2012 1011   CREATININE 0.56 05/31/2010 2057   CALCIUM 9.3 06/14/2012 1011   GFRNONAA >60 05/31/2010 2057   GFRAA  05/31/2010 2057    >60        The eGFR has been calculated using the MDRD equation. This calculation has not been validated in all clinical situations. eGFR's persistently <60 mL/min signify possible Chronic Kidney Disease.    Lipid Panel     Component Value Date/Time   CHOL 185 06/14/2012 1011   TRIG 56 06/14/2012 1011   HDL 50 06/14/2012 1011   CHOLHDL 3.7 06/14/2012 1011   VLDL 11 06/14/2012 1011   LDLCALC 124* 06/14/2012 1011    CBC    Component Value Date/Time   WBC 7.6 05/11/2014 0925   RBC 4.76 05/11/2014 0925   HGB 13.0 05/11/2014 0925   HCT 40.1 05/11/2014 0925   PLT 283 05/11/2014 0925   MCV 84.2 05/11/2014 0925   MCH 27.3 05/11/2014 0925   MCHC 32.4 05/11/2014 0925   RDW 14.7 05/11/2014 0925   LYMPHSABS 2.0 05/11/2014 0925   MONOABS 0.8 05/11/2014 0925   EOSABS 0.2 05/11/2014 0925   BASOSABS 0.0 05/11/2014 0925    Hgb A1C No results found for: HGBA1C      Assessment & Plan:   Acne:  eRx for benzyl peroxide to use daily Avoid caffeine, chocolate, etc  Rash:  eRx for pred taper x 6 days If no improvement, will refer to  derm  RTC as needed or if symptoms persist or worsen

## 2014-09-24 ENCOUNTER — Encounter (HOSPITAL_COMMUNITY): Payer: Self-pay

## 2014-09-24 ENCOUNTER — Emergency Department (HOSPITAL_COMMUNITY)
Admission: EM | Admit: 2014-09-24 | Discharge: 2014-09-24 | Disposition: A | Discharge status: Home / Self Care | Payer: 59 | Attending: Emergency Medicine | Admitting: Emergency Medicine

## 2014-09-24 DIAGNOSIS — R1012 Left upper quadrant pain: Secondary | ICD-10-CM | POA: Insufficient documentation

## 2014-09-24 DIAGNOSIS — Z86018 Personal history of other benign neoplasm: Secondary | ICD-10-CM | POA: Insufficient documentation

## 2014-09-24 DIAGNOSIS — Z8639 Personal history of other endocrine, nutritional and metabolic disease: Secondary | ICD-10-CM | POA: Insufficient documentation

## 2014-09-24 DIAGNOSIS — Z8614 Personal history of Methicillin resistant Staphylococcus aureus infection: Secondary | ICD-10-CM | POA: Insufficient documentation

## 2014-09-24 DIAGNOSIS — R1013 Epigastric pain: Secondary | ICD-10-CM | POA: Insufficient documentation

## 2014-09-24 DIAGNOSIS — R1110 Vomiting, unspecified: Secondary | ICD-10-CM | POA: Insufficient documentation

## 2014-09-24 DIAGNOSIS — R1011 Right upper quadrant pain: Secondary | ICD-10-CM | POA: Diagnosis not present

## 2014-09-24 DIAGNOSIS — Z8619 Personal history of other infectious and parasitic diseases: Secondary | ICD-10-CM | POA: Insufficient documentation

## 2014-09-24 DIAGNOSIS — Z3202 Encounter for pregnancy test, result negative: Secondary | ICD-10-CM | POA: Diagnosis not present

## 2014-09-24 DIAGNOSIS — R109 Unspecified abdominal pain: Secondary | ICD-10-CM

## 2014-09-24 LAB — COMPREHENSIVE METABOLIC PANEL
ALT: 12 U/L — AB (ref 14–54)
ANION GAP: 8 (ref 5–15)
AST: 18 U/L (ref 15–41)
Albumin: 4.4 g/dL (ref 3.5–5.0)
Alkaline Phosphatase: 50 U/L (ref 38–126)
BUN: 10 mg/dL (ref 6–20)
CO2: 27 mmol/L (ref 22–32)
CREATININE: 0.69 mg/dL (ref 0.44–1.00)
Calcium: 9 mg/dL (ref 8.9–10.3)
Chloride: 106 mmol/L (ref 101–111)
GFR calc Af Amer: >60 mL/min (ref >60)
GFR calc non Af Amer: >60 mL/min (ref >60)
Glucose, Bld: 96 mg/dL (ref 65–99)
POTASSIUM: 3.9 mmol/L (ref 3.5–5.1)
SODIUM: 141 mmol/L (ref 135–145)
Total Bilirubin: 0.4 mg/dL (ref 0.3–1.2)
Total Protein: 7.4 g/dL (ref 6.5–8.1)

## 2014-09-24 LAB — URINALYSIS, ROUTINE W REFLEX MICROSCOPIC
BILIRUBIN URINE: NEGATIVE
GLUCOSE, UA: NEGATIVE mg/dL
HGB URINE DIPSTICK: NEGATIVE
Ketones, ur: NEGATIVE mg/dL
LEUKOCYTES UA: NEGATIVE
NITRITE: NEGATIVE
PH: 6 (ref 5.0–8.0)
Protein, ur: NEGATIVE mg/dL
Specific Gravity, Urine: 1.023 (ref 1.005–1.030)
Urobilinogen, UA: 0.2 mg/dL (ref 0.0–1.0)

## 2014-09-24 LAB — POC URINE PREG, ED: Preg Test, Ur: NEGATIVE

## 2014-09-24 LAB — CBC WITH DIFFERENTIAL/PLATELET
BASOS ABS: 0 10*3/uL (ref 0.0–0.1)
BASOS PCT: 1 % (ref 0–1)
EOS PCT: 2 % (ref 0–5)
Eosinophils Absolute: 0.1 10*3/uL (ref 0.0–0.7)
HCT: 43.2 % (ref 36.0–46.0)
HEMOGLOBIN: 13.7 g/dL (ref 12.0–15.0)
LYMPHS ABS: 2.4 10*3/uL (ref 0.7–4.0)
Lymphocytes Relative: 37 % (ref 12–46)
MCH: 27 pg (ref 26.0–34.0)
MCHC: 31.7 g/dL (ref 30.0–36.0)
MCV: 85 fL (ref 78.0–100.0)
MONO ABS: 0.5 10*3/uL (ref 0.1–1.0)
MONOS PCT: 7 % (ref 3–12)
NEUTROS PCT: 53 % (ref 43–77)
Neutro Abs: 3.5 10*3/uL (ref 1.7–7.7)
PLATELETS: 282 10*3/uL (ref 150–400)
RBC: 5.08 MIL/uL (ref 3.87–5.11)
RDW: 14.8 % (ref 11.5–15.5)
WBC: 6.4 10*3/uL (ref 4.0–10.5)

## 2014-09-24 LAB — LIPASE, BLOOD: Lipase: 26 U/L (ref 22–51)

## 2014-09-24 MED ORDER — SODIUM CHLORIDE 0.9 % IV BOLUS (SEPSIS)
500.0000 mL | Freq: Once | INTRAVENOUS | Status: AC
  Administered 2014-09-24: 500 mL via INTRAVENOUS

## 2014-09-24 MED ORDER — ONDANSETRON HCL 4 MG/2ML IJ SOLN
4.0000 mg | Freq: Once | INTRAMUSCULAR | Status: AC
  Administered 2014-09-24: 4 mg via INTRAVENOUS
  Filled 2014-09-24: qty 2

## 2014-09-24 MED ORDER — KETOROLAC TROMETHAMINE 30 MG/ML IJ SOLN
30.0000 mg | Freq: Once | INTRAMUSCULAR | Status: AC
  Administered 2014-09-24: 30 mg via INTRAVENOUS
  Filled 2014-09-24: qty 1

## 2014-09-24 NOTE — ED Provider Notes (Signed)
CSN: [id number]     Arrival date & time 09/24/14  2094 History   First MD Initiated Contact with Patient 09/24/14 1008     Chief Complaint  Patient presents with  . Abdominal Pain  . Emesis     (Consider location/radiation/quality/duration/timing/severity/associated sxs/prior Treatment) HPI Comments: Patient is a 29 year old female with history of irritable bowel. She woke this morning at approximately 6 AM with complaints of abdominal pain. Her pain is mainly in the epigastrium, however she does report pain throughout. She denies any diarrhea but does report vomiting this morning and recent bouts of constipation. She denies fevers or chills.  Patient is a 29 y.o. female presenting with abdominal pain and vomiting. The history is provided by the patient.  Abdominal Pain Pain location:  Epigastric Pain quality: cramping   Pain radiates to:  Does not radiate Pain severity:  Moderate Onset quality:  Sudden Duration:  4 hours Timing:  Constant Progression:  Unchanged Chronicity:  New Context: awakening from sleep   Relieved by:  Nothing Worsened by:  Nothing tried Ineffective treatments:  None tried Associated symptoms: vomiting   Emesis Associated symptoms: abdominal pain     Past Medical History  Diagnosis Date  . Fibroadenoma of breast     LEFT  . LGSIL (low grade squamous intraepithelial dysplasia) 12/2008, 02/2011    C&B WITH LGSIL 02/2011  . IBS (irritable bowel syndrome)   . Elevated prolactin level     30 range 12/2008, normal X multiple repeats, most recent 18 04/2011  . STD (sexually transmitted disease)     History of Chlamydia  . MRSA (methicillin resistant staph aureus) culture positive 04/2014    vulvar abscess  . Chicken pox    Past Surgical History  Procedure Laterality Date  . Induced abortion  7096,2836    x2  . Adenoidectomy      ASA CHILD  . Colposcopy    . Dilation and curettage of uterus    . Cesarean section N/A 03/06/2013    Procedure: CESAREAN  SECTION;  Surgeon: Melina Schools, MD;  Location: [facility];  Service: Obstetrics;  Laterality: N/A;  . Nexplanon  03/2013   Family History  Problem Relation Age of Onset  . Other Brother     TB  . Aneurysm Maternal Grandfather   . Alcohol abuse Neg Hx   . Arthritis Neg Hx   . Asthma Neg Hx   . Birth defects Neg Hx   . Cancer Neg Hx   . COPD Neg Hx   . Depression Neg Hx   . Diabetes Neg Hx   . Drug abuse Neg Hx   . Early death Neg Hx   . Hearing loss Neg Hx   . Heart disease Neg Hx   . Hyperlipidemia Neg Hx   . Hypertension Neg Hx   . Kidney disease Neg Hx   . Learning disabilities Neg Hx   . Mental illness Neg Hx   . Mental retardation Neg Hx   . Miscarriages / Stillbirths Neg Hx   . Vision loss Neg Hx   . Varicose Veins Neg Hx    History  Substance Use Topics  . Smoking status: Former Smoker -- 3 years    Types: Cigarettes    Quit date: 04/12/2010  . Smokeless tobacco: Never Used     Comment: Maybe 1 cigarette every 2 days  . Alcohol Use: No     Comment: Occasional   OB History    Gravida Para  Term Preterm AB TAB SAB Ectopic Multiple Living   5 3 3  2 1 1   2      Review of Systems  Gastrointestinal: Positive for vomiting and abdominal pain.  All other systems reviewed and are negative.     Allergies  Banana  Home Medications   Prior to Admission medications   Medication Sig Start Date End Date Taking? Authorizing Provider  Benzoyl Peroxide 2.75 % GEL Apply 1 application topically daily. 08/27/14  Yes Jearld Fenton, NP  etonogestrel (NEXPLANON) 68 MG IMPL implant Inject 1 each into the skin once.   Yes Historical Provider, MD  aluminum chloride (DRYSOL) 20 % external solution Apply topically at bedtime. Patient not taking: Reported on 09/24/2014 06/18/14   Jearld Fenton, NP  ibuprofen (ADVIL,MOTRIN) 800 MG tablet Take 1 tablet (800 mg total) by mouth every 8 (eight) hours as needed. Patient not taking: Reported on 09/24/2014 05/12/14   Anastasio Auerbach, MD   Linaclotide [facility]) 145 MCG CAPS capsule Take 1 capsule (145 mcg total) by mouth daily. Patient not taking: Reported on 09/24/2014 06/18/14   Jearld Fenton, NP  predniSONE (DELTASONE) 10 MG tablet Take 3 tabs on days 1-2, take 2 tabs on days 3-4, take 1 tab on days 5-6 Patient not taking: Reported on 09/24/2014 08/27/14   Jearld Fenton, NP   BP 107/60 mmHg  Pulse 82  Temp(Src) 97.7 F (36.5 C) (Oral)  Resp 18  Ht 5' 4.5" (1.638 m)  Wt 138 lb (62.596 kg)  BMI 23.33 kg/m2  SpO2 100% Physical Exam  Constitutional: She is oriented to person, place, and time. She appears well-developed and well-nourished. No distress.  HENT:  Head: Normocephalic and atraumatic.  Neck: Normal range of motion. Neck supple.  Cardiovascular: Normal rate and regular rhythm.  Exam reveals no gallop and no friction rub.   No murmur heard. Pulmonary/Chest: Effort normal and breath sounds normal. No respiratory distress. She has no wheezes.  Abdominal: Soft. Bowel sounds are normal. She exhibits no distension. There is tenderness. There is no rebound and no guarding.  There is tenderness to palpation in all 4 quadrants, however most pronounced in the epigastrium, right upper, and left upper quadrants.  Musculoskeletal: Normal range of motion.  Neurological: She is alert and oriented to person, place, and time.  Skin: Skin is warm and dry. She is not diaphoretic.  Nursing note and vitals reviewed.   ED Course  Procedures (including critical care time) Labs Review Labs Reviewed  CBC WITH DIFFERENTIAL/PLATELET  COMPREHENSIVE METABOLIC PANEL  LIPASE, BLOOD  URINALYSIS, ROUTINE W REFLEX MICROSCOPIC (NOT AT [facility])  PREGNANCY, URINE  POC URINE PREG, ED    Imaging Review No results found.   EKG Interpretation None      MDM   Final diagnoses:  None    After laboratory studies and urinalysis were obtained and sent to the lab, patient came out of her room and informed the nurse that her pain was gone,  she was feeling fine, and needed to leave to be at a funeral. She demanded her discharge paperwork. She will be discharged at her request with the understanding that her workup has not yet been completed and that emergent disease processes which could lead to disability and possibly death have not been ruled out.    Veryl Speak, MD 09/24/14 1114

## 2014-09-24 NOTE — Discharge Instructions (Signed)
Return to the emergency department if you desire completion of your workup.   Abdominal Pain Many things can cause abdominal pain. Usually, abdominal pain is not caused by a disease and will improve without treatment. It can often be observed and treated at home. Your health care provider will do a physical exam and possibly order blood tests and X-rays to help determine the seriousness of your pain. However, in many cases, more time must pass before a clear cause of the pain can be found. Before that point, your health care provider may not know if you need more testing or further treatment. HOME CARE INSTRUCTIONS  Monitor your abdominal pain for any changes. The following actions may help to alleviate any discomfort you are experiencing:  Only take over-the-counter or prescription medicines as directed by your health care provider.  Do not take laxatives unless directed to do so by your health care provider.  Try a clear liquid diet (broth, tea, or water) as directed by your health care provider. Slowly move to a bland diet as tolerated. SEEK MEDICAL CARE IF:  You have unexplained abdominal pain.  You have abdominal pain associated with nausea or diarrhea.  You have pain when you urinate or have a bowel movement.  You experience abdominal pain that wakes you in the night.  You have abdominal pain that is worsened or improved by eating food.  You have abdominal pain that is worsened with eating fatty foods.  You have a fever. SEEK IMMEDIATE MEDICAL CARE IF:   Your pain does not go away within 2 hours.  You keep throwing up (vomiting).  Your pain is felt only in portions of the abdomen, such as the right side or the left lower portion of the abdomen.  You pass bloody or black tarry stools. MAKE SURE YOU:  Understand these instructions.   Will watch your condition.   Will get help right away if you are not doing well or get worse.  Document Released: 01/18/2005 Document  Revised: 04/15/2013 Document Reviewed: 12/18/2012 [facility] Patient Information 2015 [place], [place]. This information is not intended to replace advice given to you by your health care provider. Make sure you discuss any questions you have with your health care provider.

## 2014-09-24 NOTE — ED Notes (Signed)
Patient c/o generalized abdominal pain and vomiting since 0600 today. Patient denies any diarrhea or fevers.

## 2014-10-29 ENCOUNTER — Ambulatory Visit (INDEPENDENT_AMBULATORY_CARE_PROVIDER_SITE_OTHER): Payer: 59 | Admitting: Internal Medicine

## 2014-10-29 ENCOUNTER — Encounter: Payer: Self-pay | Admitting: Internal Medicine

## 2014-10-29 VITALS — BP 100/70 | HR 71 | Temp 98.0°F | Wt 138.0 lb

## 2014-10-29 DIAGNOSIS — L42 Pityriasis rosea: Secondary | ICD-10-CM | POA: Insufficient documentation

## 2014-10-29 NOTE — Assessment & Plan Note (Signed)
Classic rash Reassured and discussed the expected ~6 week course antihistamines

## 2014-10-29 NOTE — Progress Notes (Signed)
Subjective:    Patient ID: [name], female    DOB: [birth year], 29 y.o.   MRN: [id number]  HPI Here due to itching and rash  Started about a week Started across breasts, now on arms and trunk Some on arms Very itchy No fever Not sick  No new meds Concerned that boyfriend had bed bugs from [place]"  Using rubbing alcohol and vinegar--- may have helped the appearance on her arm benedryl some help  No current outpatient prescriptions on file prior to visit.   No current facility-administered medications on file prior to visit.    Allergies  Allergen Reactions  . Banana Itching    Past Medical History  Diagnosis Date  . Fibroadenoma of breast     LEFT  . LGSIL (low grade squamous intraepithelial dysplasia) 12/2008, 02/2011    C&B WITH LGSIL 02/2011  . IBS (irritable bowel syndrome)   . Elevated prolactin level     30 range 12/2008, normal X multiple repeats, most recent 18 04/2011  . STD (sexually transmitted disease)     History of Chlamydia  . MRSA (methicillin resistant staph aureus) culture positive 04/2014    vulvar abscess  . Chicken pox     Past Surgical History  Procedure Laterality Date  . Induced abortion  7741,[id number]    x2  . Adenoidectomy      ASA CHILD  . Colposcopy    . Dilation and curettage of uterus    . Cesarean section N/A 03/06/2013    Procedure: CESAREAN SECTION;  Surgeon: Melina Schools, MD;  Location: [facility];  Service: Obstetrics;  Laterality: N/A;  . Nexplanon  03/2013    Family History  Problem Relation Age of Onset  . Other Brother     TB  . Aneurysm Maternal Grandfather   . Alcohol abuse Neg Hx   . Arthritis Neg Hx   . Asthma Neg Hx   . Birth defects Neg Hx   . Cancer Neg Hx   . COPD Neg Hx   . Depression Neg Hx   . Diabetes Neg Hx   . Drug abuse Neg Hx   . Early death Neg Hx   . Hearing loss Neg Hx   . Heart disease Neg Hx   . Hyperlipidemia Neg Hx   . Hypertension Neg Hx   . Kidney disease  Neg Hx   . Learning disabilities Neg Hx   . Mental illness Neg Hx   . Mental retardation Neg Hx   . Miscarriages / Stillbirths Neg Hx   . Vision loss Neg Hx   . Varicose Veins Neg Hx     History   Social History  . Marital Status: Single    Spouse Name: N/A  . Number of Children: 1  . Years of Education: N/A   Occupational History  . 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 Smoking status: Former Smoker -- 3 years    Types: Cigarettes    Quit date: 04/12/2010  . Smokeless tobacco: Never Used     Comment: Maybe 1 cigarette every 2 days  . Alcohol Use: No     Comment: Occasional  . Drug Use: No  . Sexual Activity:    Partners: Male    Museum/gallery curator: , Implant     Comment: 1st intercourse 29 yo-More than 5 partners   Other Topics Concern  . Not on file  Social History Narrative   Review of Systems No mouth swelling or lips  Appetite is fine Son with recent hand/foot/mouth    Objective:   Physical Exam  Constitutional: She appears well-developed and well-nourished. No distress.  Skin:  Classic lesions along cleavage lines in back and trunk          Assessment & Plan:

## 2014-10-29 NOTE — Patient Instructions (Addendum)
Please try fexofenadine 180mg  daily or cetirizine 10mg  daily (or both) for the itching. You can try benedryl at night.  Pityriasis Rosea Pityriasis rosea is a rash which is probably caused by a virus. It generally starts as a scaly, red patch on the trunk (the area of the body that a t-shirt would cover) but does not appear on sun exposed areas. The rash is usually preceded by an initial larger spot called the "herald patch" a week or more before the rest of the rash appears. Generally within one to two days the rash appears rapidly on the trunk, upper arms, and sometimes the upper legs. The rash usually appears as flat, oval patches of scaly pink color. The rash can also be raised and one is able to feel it with a finger. The rash can also be finely crinkled and may slough off leaving a ring of scale around the spot. Sometimes a mild sore throat is present with the rash. It usually affects children and young adults in the spring and autumn. Women are more frequently affected than men. TREATMENT  Pityriasis rosea is a self-limited condition. This means it goes away within 4 to 8 weeks without treatment. The spots may persist for several months, especially in darker-colored skin after the rash has resolved and healed. Benadryl and steroid creams may be used if itching is a problem. SEEK MEDICAL CARE IF:   Your rash does not go away or persists longer than three months.  You develop fever and joint pain.  You develop severe headache and confusion.  You develop breathing difficulty, vomiting and/or extreme weakness. Document Released: 05/17/2001 Document Revised: 07/03/2011 Document Reviewed: 06/05/2008 [facility] Patient Information 2015 [place], [place]. This information is not intended to replace advice given to you by your health care provider. Make sure you discuss any questions you have with your health care provider.

## 2014-10-29 NOTE — Progress Notes (Signed)
Pre visit review using our clinic review tool, if applicable. No additional management support is needed unless otherwise documented below in the visit note. 

## 2014-12-01 ENCOUNTER — Emergency Department (INDEPENDENT_AMBULATORY_CARE_PROVIDER_SITE_OTHER)
Admission: EM | Admit: 2014-12-01 | Discharge: 2014-12-01 | Disposition: A | Discharge status: Home / Self Care | Payer: 59 | Source: Home / Self Care | Attending: Emergency Medicine | Admitting: Emergency Medicine

## 2014-12-01 ENCOUNTER — Encounter (HOSPITAL_COMMUNITY): Payer: Self-pay | Admitting: Emergency Medicine

## 2014-12-01 DIAGNOSIS — M6249 Contracture of muscle, multiple sites: Secondary | ICD-10-CM

## 2014-12-01 DIAGNOSIS — M62838 Other muscle spasm: Secondary | ICD-10-CM

## 2014-12-01 MED ORDER — CYCLOBENZAPRINE HCL 10 MG PO TABS: 10.0000 mg | ORAL_TABLET | Freq: Three times a day (TID) | ORAL | Status: DC | PRN

## 2014-12-01 MED ORDER — MELOXICAM 15 MG PO TABS: 15.0000 mg | ORAL_TABLET | Freq: Every day | ORAL | Status: DC

## 2014-12-01 NOTE — Discharge Instructions (Signed)
You have spasm in your neck muscles. Take meloxicam daily for the next 5 days, then as needed. Use Flexeril every 8 hours as needed. This medicine will make you drowsy. Apply heat as often as you can. Make sure you are sitting comfortably at work. Use a supportive pillow at night. Follow-up as needed.

## 2014-12-01 NOTE — ED Provider Notes (Signed)
CSN: [id number]     Arrival date & time 12/01/14  1550 History   First MD Initiated Contact with Patient 12/01/14 1814     Chief Complaint  Patient presents with  . Neck Pain   (Consider location/radiation/quality/duration/timing/severity/associated sxs/prior Treatment) HPI  She is a 29 year old woman here for evaluation of bilateral neck pain. She states she woke up this morning and her neck was stiff. It is worse with any sort of head movement. It goes to her shoulders. She denies any pain rating down her arms. No numbness, tingling, weakness. No injury or trauma. 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 She states she does push her head forward a lot at work. No new pillows or mattress.  Past Medical History  Diagnosis Date  . Fibroadenoma of breast     LEFT  . LGSIL (low grade squamous intraepithelial dysplasia) 12/2008, 02/2011    C&B WITH LGSIL 02/2011  . IBS (irritable bowel syndrome)   . Elevated prolactin level     30 range 12/2008, normal X multiple repeats, most recent 18 04/2011  . STD (sexually transmitted disease)     History of Chlamydia  . MRSA (methicillin resistant staph aureus) culture positive 04/2014    vulvar abscess  . Chicken pox    Past Surgical History  Procedure Laterality Date  . Induced abortion  2197,[id number]    x2  . Adenoidectomy      ASA CHILD  . Colposcopy    . Dilation and curettage of uterus    . Cesarean section N/A 03/06/2013    Procedure: CESAREAN SECTION;  Surgeon: Melina Schools, MD;  Location: [facility];  Service: Obstetrics;  Laterality: N/A;  . Nexplanon  03/2013   Family History  Problem Relation Age of Onset  . Other Brother     TB  . Aneurysm Maternal Grandfather   . Alcohol abuse Neg Hx   . Arthritis Neg Hx   . Asthma Neg Hx   . Birth defects Neg Hx   . Cancer Neg Hx   . COPD Neg Hx   . Depression Neg Hx   . Diabetes Neg Hx   . Drug abuse Neg Hx   . Early death Neg Hx   . Hearing loss  Neg Hx   . Heart disease Neg Hx   . Hyperlipidemia Neg Hx   . Hypertension Neg Hx   . Kidney disease Neg Hx   . Learning disabilities Neg Hx   . Mental illness Neg Hx   . Mental retardation Neg Hx   . Miscarriages / Stillbirths Neg Hx   . Vision loss Neg Hx   . Varicose Veins Neg Hx    History  Substance Use Topics  . Smoking status: Former Smoker -- 3 years    Types: Cigarettes    Quit date: 04/12/2010  . Smokeless tobacco: Never Used     Comment: Maybe 1 cigarette every 2 days  . Alcohol Use: No     Comment: Occasional   OB History    Gravida Para Term Preterm AB TAB SAB Ectopic Multiple Living   5 3 3  2 1 1   2      Review of Systems As in history of present illness Allergies  Banana  Home Medications   Prior to Admission medications   Medication Sig Start Date End Date Taking? Authorizing Provider  aluminum chloride (DRYSOL) 20 % external solution Apply topically at  bedtime.    Historical Provider, MD  cyclobenzaprine (FLEXERIL) 10 MG tablet Take 1 tablet (10 mg total) by mouth 3 (three) times daily as needed for muscle spasms. 12/01/14   Melony Overly, MD  meloxicam (MOBIC) 15 MG tablet Take 1 tablet (15 mg total) by mouth daily. For 5 days, then as needed 12/01/14   Melony Overly, MD   BP 118/69 mmHg  Pulse 69  Temp(Src) 98.6 F (37 C) (Oral)  Resp 20  SpO2 100%  LMP 11/17/2014 Physical Exam  Constitutional: She is oriented to person, place, and time. She appears well-developed and well-nourished. No distress.  Neck:  Range of motion somewhat limited by pain. No spinal tenderness or step-offs. She is tender along the bilateral paraspinous muscles. 5 out of 5 strength in bilateral upper extremities.  Cardiovascular: Normal rate.   Pulmonary/Chest: Effort normal.  Neurological: She is alert and oriented to person, place, and time.    ED Course  Procedures (including critical care time) Labs Review Labs Reviewed - No data to display  Imaging Review No  results found.   MDM   1. Cervical paraspinal muscle spasm    Treat with meloxicam and Flexeril. Recommended heat. Follow-up as needed.    Melony Overly, MD 12/01/14 [phone]

## 2014-12-01 NOTE — ED Notes (Signed)
Pt c/o upper back/neck pain onset today when she woke up around 0900 Denies injt/trauma... Pain increases w/activity Alert... No acute distress.

## 2015-01-11 ENCOUNTER — Ambulatory Visit (INDEPENDENT_AMBULATORY_CARE_PROVIDER_SITE_OTHER): Payer: 59 | Admitting: Internal Medicine

## 2015-01-11 ENCOUNTER — Encounter: Payer: Self-pay | Admitting: Internal Medicine

## 2015-01-11 VITALS — BP 116/74 | HR 70 | Temp 98.5°F | Ht 64.5 in | Wt 136.0 lb

## 2015-01-11 DIAGNOSIS — Z0000 Encounter for general adult medical examination without abnormal findings: Secondary | ICD-10-CM

## 2015-01-11 DIAGNOSIS — K5900 Constipation, unspecified: Secondary | ICD-10-CM | POA: Diagnosis not present

## 2015-01-11 DIAGNOSIS — K219 Gastro-esophageal reflux disease without esophagitis: Secondary | ICD-10-CM | POA: Diagnosis not present

## 2015-01-11 DIAGNOSIS — B373 Candidiasis of vulva and vagina: Secondary | ICD-10-CM | POA: Diagnosis not present

## 2015-01-11 DIAGNOSIS — B3731 Acute candidiasis of vulva and vagina: Secondary | ICD-10-CM

## 2015-01-11 LAB — COMPREHENSIVE METABOLIC PANEL
ALBUMIN: 4.3 g/dL (ref 3.5–5.2)
ALT: 12 U/L (ref 0–35)
AST: 18 U/L (ref 0–37)
Alkaline Phosphatase: 47 U/L (ref 39–117)
BUN: 14 mg/dL (ref 6–23)
CALCIUM: 9.3 mg/dL (ref 8.4–10.5)
CO2: 29 meq/L (ref 19–32)
Chloride: 105 mEq/L (ref 96–112)
Creatinine, Ser: 0.84 mg/dL (ref 0.40–1.20)
GFR: 104.23 mL/min (ref >60.00)
Glucose, Bld: 101 mg/dL — ABNORMAL HIGH (ref 70–99)
POTASSIUM: 4 meq/L (ref 3.5–5.1)
Sodium: 140 mEq/L (ref 135–145)
Total Bilirubin: 0.6 mg/dL (ref 0.2–1.2)
Total Protein: 7.3 g/dL (ref 6.0–8.3)

## 2015-01-11 LAB — LIPID PANEL
CHOL/HDL RATIO: 3
CHOLESTEROL: 158 mg/dL (ref 0–200)
HDL: 47.4 mg/dL (ref >39.00)
LDL CALC: 97 mg/dL (ref 0–99)
NonHDL: 110.58
Triglycerides: 66 mg/dL (ref 0.0–149.0)
VLDL: 13.2 mg/dL (ref 0.0–40.0)

## 2015-01-11 LAB — CBC
HEMATOCRIT: 40.8 % (ref 36.0–46.0)
Hemoglobin: 13.2 g/dL (ref 12.0–15.0)
MCHC: 32.4 g/dL (ref 30.0–36.0)
MCV: 84.6 fl (ref 78.0–100.0)
PLATELETS: 280 10*3/uL (ref 150.0–400.0)
RBC: 4.83 Mil/uL (ref 3.87–5.11)
RDW: 14.2 % (ref 11.5–15.5)
WBC: 6.1 10*3/uL (ref 4.0–10.5)

## 2015-01-11 LAB — HEMOGLOBIN A1C: Hgb A1c MFr Bld: 5.8 % (ref 4.6–6.5)

## 2015-01-11 MED ORDER — FLUCONAZOLE 150 MG PO TABS: 150.0000 mg | ORAL_TABLET | Freq: Once | ORAL | Status: DC

## 2015-01-11 NOTE — Progress Notes (Signed)
Pre visit review using our clinic review tool, if applicable. No additional management support is needed unless otherwise documented below in the visit note. 

## 2015-01-11 NOTE — Addendum Note (Signed)
Addended by: Jearld Fenton on: 01/11/2015 12:57 PM   Modules accepted: Miquel Dunn

## 2015-01-11 NOTE — Progress Notes (Signed)
Subjective:    Patient ID: [name], female    DOB: 1985-06-29, 29 y.o.   MRN: [id number]  HPI  Pt presents to the clinic today to or her annual exam. She has a form that she needs filled out for her employer.  Flu: 11/2012 Tetanus: 11/2012 LMP: irregular d/t Nexplanon Pap Smear: 04/2014- normal Dentist: as needed  Diet: She does not adhere to any specific diet. She does eat meat, fruits and veggies. She consumes mostly water and juices. Exercise: She does a few strengthening exercises daily while at work.   She does have some concerns today about vaginal odor. She started using a new soap. Since that time, she has noticed a change in her vaginal odor. She switched back to [facility] but the odor has not improved. She denies vaginal discharge, itching or abnormal bleeding.  Review of Systems      Past Medical History  Diagnosis Date  . Fibroadenoma of breast     LEFT  . LGSIL (low grade squamous intraepithelial dysplasia) 12/2008, 02/2011    C&B WITH LGSIL 02/2011  . IBS (irritable bowel syndrome)   . Elevated prolactin level     30 range 12/2008, normal X multiple repeats, most recent 18 04/2011  . STD (sexually transmitted disease)     History of Chlamydia  . MRSA (methicillin resistant staph aureus) culture positive 04/2014    vulvar abscess  . Chicken pox     Current Outpatient Prescriptions  Medication Sig Dispense Refill  . aluminum chloride (DRYSOL) 20 % external solution Apply topically at bedtime.    . cyclobenzaprine (FLEXERIL) 10 MG tablet Take 1 tablet (10 mg total) by mouth 3 (three) times daily as needed for muscle spasms. 30 tablet 0  . meloxicam (MOBIC) 15 MG tablet Take 1 tablet (15 mg total) by mouth daily. For 5 days, then as needed 30 tablet 0   No current facility-administered medications for this visit.    Allergies  Allergen Reactions  . Banana Itching    Family History  Problem Relation Age of Onset  . Other Brother     TB  .  Aneurysm Maternal Grandfather   . Alcohol abuse Neg Hx   . Arthritis Neg Hx   . Asthma Neg Hx   . Birth defects Neg Hx   . Cancer Neg Hx   . COPD Neg Hx   . Depression Neg Hx   . Diabetes Neg Hx   . Drug abuse Neg Hx   . Early death Neg Hx   . Hearing loss Neg Hx   . Heart disease Neg Hx   . Hyperlipidemia Neg Hx   . Hypertension Neg Hx   . Kidney disease Neg Hx   . Learning disabilities Neg Hx   . Mental illness Neg Hx   . Mental retardation Neg Hx   . Miscarriages / Stillbirths Neg Hx   . Vision loss Neg Hx   . Varicose Veins Neg Hx     Social History   Social History  . Marital Status: Single    Spouse Name: N/A  . Number of Children: 1  . Years of Education: N/A   Occupational History  . 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 Smoking status: Former Smoker -- 3 years    Types: Cigarettes    Quit date: 04/12/2010  . Smokeless tobacco: Never Used     Comment: Maybe 1  cigarette every 2 days  . Alcohol Use: No     Comment: Occasional  . Drug Use: No  . Sexual Activity:    Partners: Male    Museum/gallery curator: , Implant     Comment: 1st intercourse 29 yo-More than 5 partners   Other Topics Concern  . Not on file   Social History Narrative     Constitutional: Denies fever, malaise, fatigue, headache or abrupt weight changes.  HEENT: Pt reports that she has to squint more to read. Denies eye pain, eye redness, ear pain, ringing in the ears, wax buildup, runny nose, nasal congestion, bloody nose, or sore throat. Respiratory: Denies difficulty breathing, shortness of breath, cough or sputum production.   Cardiovascular: Denies chest pain, chest tightness, palpitations or swelling in the hands or feet.  Gastrointestinal: Pt reports constipation and occasional reflux. Denies abdominal pain, bloating, constipation, diarrhea or blood in the stool.  GU: Pt reports vaginal odor. Denies urgency, frequency, pain with  urination, burning sensation, blood in urine, odor or discharge. Musculoskeletal: Denies decrease in range of motion, difficulty with gait, muscle pain or joint pain and swelling.  Skin: Denies redness, rashes, lesions or ulcercations.  Neurological: Denies dizziness, difficulty with memory, difficulty with speech or problems with balance and coordination.  Psych: Denies anxiety, depression, SI/HI.  No other specific complaints in a complete review of systems (except as listed in HPI above).  Objective:   Physical Exam  BP 116/74 mmHg  Pulse 70  Temp(Src) 98.5 F (36.9 C) (Oral)  Ht 5' 4.5" (1.638 m)  Wt 136 lb (61.689 kg)  BMI 22.99 kg/m2  SpO2 99%  LMP   Breastfeeding? No Wt Readings from Last 3 Encounters:  01/11/15 136 lb (61.689 kg)  10/29/14 138 lb (62.596 kg)  09/24/14 138 lb (62.596 kg)    General: Appears her stated age, well developed, well nourished in NAD. Skin: Warm, dry and intact. No rashes, lesions or ulcerations noted. HEENT: Head: normal shape and size; Eyes: sclera white, no icterus, conjunctiva pink, PERRLA and EOMs intact; Ears: Tm's gray and intact, normal light reflex; Throat/Mouth: Teeth present, mucosa pink and moist, no exudate, lesions or ulcerations noted.  Neck:  Neck supple, trachea midline. No masses, lumps or thyromegaly present.  Cardiovascular: Normal rate and rhythm. S1,S2 noted.  No murmur, rubs or gallops noted. Pulmonary/Chest: Normal effort and positive vesicular breath sounds. No respiratory distress. No wheezes, rales or ronchi noted.  Abdomen: Soft and nontender. Normal bowel sounds, no bruits noted. No distention or masses noted. Liver, spleen and kidneys non palpable. Pelvic: Normal female anatomy. Thick white discharge noted at vaginal opening.  Musculoskeletal: Normal range of motion. Strength 5/5 BUE/BLE. No signs of joint swelling. No difficulty with gait.  Neurological: Alert and oriented. Cranial nerves II-XII grossly intact.  Coordination normal.  Psychiatric: Mood and affect normal. Behavior is normal. Judgment and thought content normal.     BMET    Component Value Date/Time   NA 141 09/24/2014 1036   K 3.9 09/24/2014 1036   CL 106 09/24/2014 1036   CO2 27 09/24/2014 1036   GLUCOSE 96 09/24/2014 1036   BUN 10 09/24/2014 1036   CREATININE 0.69 09/24/2014 1036   CREATININE 0.78 06/14/2012 1011   CALCIUM 9.0 09/24/2014 1036   GFRNONAA >60 09/24/2014 1036   GFRAA >60 09/24/2014 1036    Lipid Panel     Component Value Date/Time   CHOL 185 06/14/2012 1011   TRIG 56 06/14/2012 1011  HDL 50 06/14/2012 1011   CHOLHDL 3.7 06/14/2012 1011   VLDL 11 06/14/2012 1011   LDLCALC 124* 06/14/2012 1011    CBC    Component Value Date/Time   WBC 6.4 09/24/2014 1036   RBC 5.08 09/24/2014 1036   HGB 13.7 09/24/2014 1036   HCT 43.2 09/24/2014 1036   PLT 282 09/24/2014 1036   MCV 85.0 09/24/2014 1036   MCH 27.0 09/24/2014 1036   MCHC 31.7 09/24/2014 1036   RDW 14.8 09/24/2014 1036   LYMPHSABS 2.4 09/24/2014 1036   MONOABS 0.5 09/24/2014 1036   EOSABS 0.1 09/24/2014 1036   BASOSABS 0.0 09/24/2014 1036    Hgb A1C No results found for: HGBA1C       Assessment & Plan:   Preventative Health Maintenance:  Encouraged her to consume a healthy diet/exercise regimen Flu shot today Tdap UTD Pap Smear UTD Encouraged her to see a dentist at least annually  Constipation:  Encouraged her to drink more water Advised her to try a probiotic daily such as [employer] or [employer]  GERD:  Triggered by tomato based foods, spicy foods and hot dogs Advised her to avoid trigger foods Encouraged her to take Tums as needed  Vaginal Candidiasis:  Wet prep: + yeast eRx for Diflucan 150 mg PO x 1, may repeat in 3 days if needed Advised her to avoid scented soaps or douches  RTC in 1 year or sooner if needed

## 2015-01-11 NOTE — Patient Instructions (Signed)

## 2015-03-24 ENCOUNTER — Encounter (HOSPITAL_COMMUNITY): Payer: Self-pay

## 2015-03-24 ENCOUNTER — Emergency Department (HOSPITAL_COMMUNITY)
Admission: EM | Admit: 2015-03-24 | Discharge: 2015-03-24 | Disposition: A | Discharge status: Home / Self Care | Payer: 59 | Attending: Emergency Medicine | Admitting: Emergency Medicine

## 2015-03-24 DIAGNOSIS — Z86018 Personal history of other benign neoplasm: Secondary | ICD-10-CM | POA: Diagnosis not present

## 2015-03-24 DIAGNOSIS — R1084 Generalized abdominal pain: Secondary | ICD-10-CM | POA: Insufficient documentation

## 2015-03-24 DIAGNOSIS — R197 Diarrhea, unspecified: Secondary | ICD-10-CM | POA: Diagnosis not present

## 2015-03-24 DIAGNOSIS — Z8614 Personal history of Methicillin resistant Staphylococcus aureus infection: Secondary | ICD-10-CM | POA: Insufficient documentation

## 2015-03-24 DIAGNOSIS — Z87891 Personal history of nicotine dependence: Secondary | ICD-10-CM | POA: Insufficient documentation

## 2015-03-24 DIAGNOSIS — Z8639 Personal history of other endocrine, nutritional and metabolic disease: Secondary | ICD-10-CM | POA: Diagnosis not present

## 2015-03-24 DIAGNOSIS — Z8719 Personal history of other diseases of the digestive system: Secondary | ICD-10-CM | POA: Diagnosis not present

## 2015-03-24 DIAGNOSIS — Z8742 Personal history of other diseases of the female genital tract: Secondary | ICD-10-CM | POA: Diagnosis not present

## 2015-03-24 DIAGNOSIS — R112 Nausea with vomiting, unspecified: Secondary | ICD-10-CM

## 2015-03-24 DIAGNOSIS — Z8619 Personal history of other infectious and parasitic diseases: Secondary | ICD-10-CM | POA: Diagnosis not present

## 2015-03-24 DIAGNOSIS — Z3202 Encounter for pregnancy test, result negative: Secondary | ICD-10-CM | POA: Diagnosis not present

## 2015-03-24 LAB — I-STAT BETA HCG BLOOD, ED (MC, WL, AP ONLY): I-stat hCG, quantitative: <5 m[IU]/mL (ref <5)

## 2015-03-24 LAB — URINALYSIS, ROUTINE W REFLEX MICROSCOPIC
GLUCOSE, UA: NEGATIVE mg/dL
Hgb urine dipstick: NEGATIVE
Ketones, ur: 15 mg/dL — AB
LEUKOCYTES UA: NEGATIVE
Nitrite: NEGATIVE
PH: 6 (ref 5.0–8.0)
PROTEIN: 30 mg/dL — AB
Specific Gravity, Urine: 1.039 — ABNORMAL HIGH (ref 1.005–1.030)

## 2015-03-24 LAB — URINE MICROSCOPIC-ADD ON

## 2015-03-24 LAB — COMPREHENSIVE METABOLIC PANEL
ALBUMIN: 4.7 g/dL (ref 3.5–5.0)
ALT: 19 U/L (ref 14–54)
AST: 23 U/L (ref 15–41)
Alkaline Phosphatase: 45 U/L (ref 38–126)
Anion gap: 8 (ref 5–15)
BILIRUBIN TOTAL: 1.2 mg/dL (ref 0.3–1.2)
BUN: 16 mg/dL (ref 6–20)
CO2: 27 mmol/L (ref 22–32)
CREATININE: 0.98 mg/dL (ref 0.44–1.00)
Calcium: 9.6 mg/dL (ref 8.9–10.3)
Chloride: 104 mmol/L (ref 101–111)
GFR calc Af Amer: >60 mL/min (ref >60)
Glucose, Bld: 127 mg/dL — ABNORMAL HIGH (ref 65–99)
Potassium: 3.9 mmol/L (ref 3.5–5.1)
Sodium: 139 mmol/L (ref 135–145)
TOTAL PROTEIN: 8.4 g/dL — AB (ref 6.5–8.1)

## 2015-03-24 LAB — CBC
HEMATOCRIT: 43.8 % (ref 36.0–46.0)
Hemoglobin: 14.4 g/dL (ref 12.0–15.0)
MCH: 27.6 pg (ref 26.0–34.0)
MCHC: 32.9 g/dL (ref 30.0–36.0)
MCV: 83.9 fL (ref 78.0–100.0)
PLATELETS: 264 10*3/uL (ref 150–400)
RBC: 5.22 MIL/uL — ABNORMAL HIGH (ref 3.87–5.11)
RDW: 14.6 % (ref 11.5–15.5)
WBC: 5.8 10*3/uL (ref 4.0–10.5)

## 2015-03-24 LAB — LIPASE, BLOOD: Lipase: 20 U/L (ref 11–51)

## 2015-03-24 MED ORDER — SODIUM CHLORIDE 0.9 % IV BOLUS (SEPSIS): 1000.0000 mL | Freq: Once | INTRAVENOUS | Status: DC

## 2015-03-24 MED ORDER — ONDANSETRON HCL 4 MG/2ML IJ SOLN
4.0000 mg | Freq: Once | INTRAMUSCULAR | Status: AC
  Administered 2015-03-24: 4 mg via INTRAVENOUS
  Filled 2015-03-24: qty 2

## 2015-03-24 MED ORDER — ONDANSETRON HCL 4 MG/2ML IJ SOLN: 4.0000 mg | Freq: Once | INTRAMUSCULAR | Status: DC

## 2015-03-24 MED ORDER — PROMETHAZINE HCL 25 MG PO TABS: 25.0000 mg | ORAL_TABLET | Freq: Four times a day (QID) | ORAL | Status: DC | PRN

## 2015-03-24 MED ORDER — LACTATED RINGERS IV BOLUS (SEPSIS)
1000.0000 mL | Freq: Once | INTRAVENOUS | Status: AC
  Administered 2015-03-24: 1000 mL via INTRAVENOUS

## 2015-03-24 NOTE — Discharge Instructions (Signed)

## 2015-03-24 NOTE — ED Notes (Signed)
Pt c/o upper abdominal pain, n/v/d, and a headache x 2 days.  Pain score 6/10.  Pt has not taken anything for symptoms.

## 2015-03-24 NOTE — ED Provider Notes (Signed)
CSN: [id number]     Arrival date & time 03/24/15  1038 History   First MD Initiated Contact with Patient 03/24/15 1058     Chief Complaint  Patient presents with  . Abdominal Pain  . Emesis  . Diarrhea     Patient is a 29 y.o. female presenting with abdominal pain, vomiting, and diarrhea. The history is provided by the patient. No language interpreter was used.  Abdominal Pain Associated symptoms: diarrhea and vomiting   Emesis Associated symptoms: abdominal pain and diarrhea   Diarrhea Associated symptoms: abdominal pain and vomiting    [name] is a 29 y.o. female who presents to the Emergency Department complaining of abdominal pain, vomiting, diarrhea. She's had 2 days of diffuse abdominal discomfort, multiple episodes of emesis, multiple episodes of diarrhea. She had a subjective fever yesterday with chills. No cough, shortness of breath, dysuria, vaginal discharge. Multiple family members have similar symptoms. She reports vomiting all oral fluids and food for the last 2 days and feels generalized weakness. Symptoms are moderate, constant, worsening.  Past Medical History  Diagnosis Date  . Fibroadenoma of breast     LEFT  . LGSIL (low grade squamous intraepithelial dysplasia) 12/2008, 02/2011    C&B WITH LGSIL 02/2011  . IBS (irritable bowel syndrome)   . Elevated prolactin level (HCC)     30 range 12/2008, normal X multiple repeats, most recent 18 04/2011  . STD (sexually transmitted disease)     History of Chlamydia  . MRSA (methicillin resistant staph aureus) culture positive 04/2014    vulvar abscess  . Chicken pox    Past Surgical History  Procedure Laterality Date  . Induced abortion  [id number]    x2  . Adenoidectomy      ASA CHILD  . Colposcopy    . Dilation and curettage of uterus    . Cesarean section N/A 03/06/2013    Procedure: CESAREAN SECTION;  Surgeon: Melina Schools, MD;  Location: [facility];  Service: Obstetrics;  Laterality: N/A;  .  Nexplanon  03/2013   Family History  Problem Relation Age of Onset  . Other Brother     TB  . Aneurysm Maternal Grandfather   . Alcohol abuse Neg Hx   . Arthritis Neg Hx   . Asthma Neg Hx   . Birth defects Neg Hx   . Cancer Neg Hx   . COPD Neg Hx   . Depression Neg Hx   . Diabetes Neg Hx   . Drug abuse Neg Hx   . Early death Neg Hx   . Hearing loss Neg Hx   . Heart disease Neg Hx   . Hyperlipidemia Neg Hx   . Hypertension Neg Hx   . Kidney disease Neg Hx   . Learning disabilities Neg Hx   . Mental illness Neg Hx   . Mental retardation Neg Hx   . Miscarriages / Stillbirths Neg Hx   . Vision loss Neg Hx   . Varicose Veins Neg Hx    Social History  Substance Use Topics  . Smoking status: Former Smoker -- 3 years    Types: Cigarettes    Quit date: 04/12/2010  . Smokeless tobacco: Never Used     Comment: Maybe 1 cigarette every 2 days  . Alcohol Use: 0.0 oz/week    0 Standard drinks or equivalent per week     Comment: Occasional   OB History    Gravida Para Term Preterm AB TAB SAB  Ectopic Multiple Living   5 3 3  2 1 1   2      Review of Systems  Gastrointestinal: Positive for vomiting, abdominal pain and diarrhea.  All other systems reviewed and are negative.     Allergies  Banana  Home Medications   Prior to Admission medications   Medication Sig Start Date End Date Taking? Authorizing Provider  aluminum chloride (DRYSOL) 20 % external solution Apply topically at bedtime.    Historical Provider, MD  cyclobenzaprine (FLEXERIL) 10 MG tablet Take 1 tablet (10 mg total) by mouth 3 (three) times daily as needed for muscle spasms. 12/01/14   Melony Overly, MD  fluconazole (DIFLUCAN) 150 MG tablet Take 1 tablet (150 mg total) by mouth once. 01/11/15   Jearld Fenton, NP  meloxicam (MOBIC) 15 MG tablet Take 1 tablet (15 mg total) by mouth daily. For 5 days, then as needed 12/01/14   Melony Overly, MD   BP 101/70 mmHg  Pulse 79  Temp(Src) 98.6 F (37 C) (Oral)  Resp 20   SpO2 99% Physical Exam  Constitutional: She is oriented to person, place, and time. She appears well-developed and well-nourished.  HENT:  Head: Normocephalic and atraumatic.  Cardiovascular: Normal rate and regular rhythm.   No murmur heard. Pulmonary/Chest: Effort normal and breath sounds normal. No respiratory distress.  Abdominal: Soft. There is no rebound and no guarding.  Mild diffuse abdominal tenderness  Musculoskeletal: She exhibits no edema or tenderness.  Neurological: She is alert and oriented to person, place, and time.  Skin: Skin is warm and dry.  Psychiatric: She has a normal mood and affect. Her behavior is normal.  Nursing note and vitals reviewed.   ED Course  Procedures (including critical care time) Labs Review Labs Reviewed  COMPREHENSIVE METABOLIC PANEL - Abnormal; Notable for the following:    Glucose, Bld 127 (*)    Total Protein 8.4 (*)    All other components within normal limits  CBC - Abnormal; Notable for the following:    RBC 5.22 (*)    All other components within normal limits  URINALYSIS, ROUTINE W REFLEX MICROSCOPIC (NOT AT [facility]) - Abnormal; Notable for the following:    Color, Urine AMBER (*)    APPearance CLOUDY (*)    Specific Gravity, Urine 1.039 (*)    Bilirubin Urine SMALL (*)    Ketones, ur 15 (*)    Protein, ur 30 (*)    All other components within normal limits  URINE MICROSCOPIC-ADD ON - Abnormal; Notable for the following:    Squamous Epithelial / LPF 6-30 (*)    Bacteria, UA MANY (*)    All other components within normal limits  URINE CULTURE  LIPASE, BLOOD  I-STAT BETA HCG BLOOD, ED ([facility], [facility], [facility] ONLY)    Imaging Review No results found. I have personally reviewed and evaluated these images and lab results as part of my medical decision-making.   EKG Interpretation None      MDM   Final diagnoses:  Nausea vomiting and diarrhea   Patient here for evaluation of vomiting, diarrhea, abdominal discomfort.d  examination, provided with IV fluids. On repeat evaluation following fluids and antiemetics she is feeling improved. No recurrent vomiting in the emergency department. Repeat abdominal examination is benign. UA is not consistent with UTI, we will send culture and treat if positive. Discussed home care for gastroenteritis, return precautions for new or worrisome symptoms.    Quintella Reichert, MD 03/24/15 [phone]

## 2015-03-25 LAB — URINE CULTURE

## 2015-04-21 ENCOUNTER — Ambulatory Visit (INDEPENDENT_AMBULATORY_CARE_PROVIDER_SITE_OTHER): Payer: 59 | Admitting: Internal Medicine

## 2015-04-21 ENCOUNTER — Encounter: Payer: Self-pay | Admitting: Internal Medicine

## 2015-04-21 VITALS — BP 112/76 | HR 68 | Temp 98.1°F | Wt 139.0 lb

## 2015-04-21 DIAGNOSIS — K589 Irritable bowel syndrome without diarrhea: Secondary | ICD-10-CM | POA: Diagnosis not present

## 2015-04-21 MED ORDER — LINACLOTIDE 145 MCG PO CAPS: 145.0000 ug | ORAL_CAPSULE | ORAL | Status: DC

## 2015-04-21 NOTE — Patient Instructions (Signed)
Diet for Irritable Bowel Syndrome When you have irritable bowel syndrome (IBS), the foods you eat and your eating habits are very important. IBS may cause various symptoms, such as abdominal pain, constipation, or diarrhea. Choosing the right foods can help ease discomfort caused by these symptoms. Work with your health care provider and dietitian to find the best eating plan to help control your symptoms. WHAT GENERAL GUIDELINES DO I NEED TO FOLLOW?  Keep a food diary. This will help you identify foods that cause symptoms. Write down:  What you eat and when.  What symptoms you have.  When symptoms occur in relation to your meals.  Avoid foods that cause symptoms. Talk with your dietitian about other ways to get the same nutrients that are in these foods.  Eat more foods that contain fiber. Take a fiber supplement if directed by your dietitian.  Eat your meals slowly, in a relaxed setting.  Aim to eat 5-6 small meals per day. Do not skip meals.  Drink enough fluids to keep your urine clear or pale yellow.  Ask your health care provider if you should take an over-the-counter probiotic during flare-ups to help restore healthy gut bacteria.  If you have cramping or diarrhea, try making your meals low in fat and high in carbohydrates. Examples of carbohydrates are pasta, rice, whole grain breads and cereals, fruits, and vegetables.  If dairy products cause your symptoms to flare up, try eating less of them. You might be able to handle yogurt better than other dairy products because it contains bacteria that help with digestion. WHAT FOODS ARE NOT RECOMMENDED? The following are some foods and drinks that may worsen your symptoms:  Fatty foods, such as French fries.  Milk products, such as cheese or ice cream.  Chocolate.  Alcohol.  Products with caffeine, such as coffee.  Carbonated drinks, such as soda. The items listed above may not be a complete list of foods and beverages to  avoid. Contact your dietitian for more information. WHAT FOODS ARE GOOD SOURCES OF FIBER? Your health care provider or dietitian may recommend that you eat more foods that contain fiber. Fiber can help reduce constipation and other IBS symptoms. Add foods with fiber to your diet a little at a time so that your body can get used to them. Too much fiber at once might cause gas and swelling of your abdomen. The following are some foods that are good sources of fiber:  Apples.  Peaches.  Pears.  Berries.  Figs.  Broccoli (raw).  Cabbage.  Carrots.  Raw peas.  Kidney beans.  Lima beans.  Whole grain bread.  Whole grain cereal. FOR MORE INFORMATION  International Foundation for Functional Gastrointestinal Disorders: www.iffgd.org National Institute of Diabetes and Digestive and Kidney Diseases: www.niddk.nih.gov/health-information/health-topics/digestive-diseases/ibs/Pages/facts.aspx   This information is not intended to replace advice given to you by your health care provider. Make sure you discuss any questions you have with your health care provider.   Document Released: 07/01/2003 Document Revised: 05/01/2014 Document Reviewed: 07/11/2013 Elsevier Interactive Patient Education 2016 Elsevier Inc.  

## 2015-04-21 NOTE — Progress Notes (Signed)
Subjective:    Patient ID: [name], female    DOB: [birth year], 29 y.o.   MRN: [id number]  HPI  Pt presents to the clinic today with c/o ongoing constipation issues. She was diagnosed with IBS-C a few years ago. She sits on the toilet every morning for about 10 minutes straining to have a BM, but nothing comes out. She has about 1 BM per week. She has associated abdominal bloating, gas and nausea. She has been taken Benefiber daily but is not sure if it has helped or not. She was on Linzess at one point but reports it caused her to have diarrhea.   Review of Systems      Past Medical History  Diagnosis Date  . Fibroadenoma of breast     LEFT  . LGSIL (low grade squamous intraepithelial dysplasia) 12/2008, 02/2011    C&B WITH LGSIL 02/2011  . IBS (irritable bowel syndrome)   . Elevated prolactin level (HCC)     30 range 12/2008, normal X multiple repeats, most recent 18 04/2011  . STD (sexually transmitted disease)     History of Chlamydia  . MRSA (methicillin resistant staph aureus) culture positive 04/2014    vulvar abscess  . Chicken pox     Current Outpatient Prescriptions  Medication Sig Dispense Refill  . Wheat Dextrin (BENEFIBER PO) Take 1 each by mouth daily.    . Linaclotide (LINZESS) 145 MCG CAPS capsule Take 1 capsule (145 mcg total) by mouth every other day. 30 capsule 1  . promethazine (PHENERGAN) 25 MG tablet Take 1 tablet (25 mg total) by mouth every 6 (six) hours as needed for nausea or vomiting. (Patient not taking: Reported on 04/21/2015) 8 tablet 0   No current facility-administered medications for this visit.    Allergies  Allergen Reactions  . Banana Itching    Family History  Problem Relation Age of Onset  . Other Brother     TB  . Aneurysm Maternal Grandfather   . Alcohol abuse Neg Hx   . Arthritis Neg Hx   . Asthma Neg Hx   . Birth defects Neg Hx   . Cancer Neg Hx   . COPD Neg Hx   . Depression Neg Hx   . Diabetes Neg Hx   . Drug  abuse Neg Hx   . Early death Neg Hx   . Hearing loss Neg Hx   . Heart disease Neg Hx   . Hyperlipidemia Neg Hx   . Hypertension Neg Hx   . Kidney disease Neg Hx   . Learning disabilities Neg Hx   . Mental illness Neg Hx   . Mental retardation Neg Hx   . Miscarriages / Stillbirths Neg Hx   . Vision loss Neg Hx   . Varicose Veins Neg Hx     Social History   Social History  . Marital Status: Single    Spouse Name: N/A  . Number of Children: 1  . Years of Education: N/A   Occupational History  . 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 Smoking status: Former Smoker -- 3 years    Types: Cigarettes    Quit date: 04/12/2010  . Smokeless tobacco: Never Used     Comment: Maybe 1 cigarette every 2 days  . Alcohol Use: 0.0 oz/week    0 Standard drinks or equivalent per week     Comment: Occasional  . Drug Use: No  .  Sexual Activity:    Partners: Male    Museum/gallery curator: , Implant     Comment: 1st intercourse 29 yo-More than 5 partners   Other Topics Concern  . Not on file   Social History Narrative     Constitutional: Denies fever, malaise, fatigue, headache or abrupt weight changes.  Respiratory: Denies difficulty breathing, shortness of breath, cough or sputum production.   Cardiovascular: Denies chest pain, chest tightness, palpitations or swelling in the hands or feet.  Gastrointestinal: Pt reports bloating and constipation. Denies abdominal pain, diarrhea or blood in the stool.   No other specific complaints in a complete review of systems (except as listed in HPI above).  Objective:   Physical Exam   BP 112/76 mmHg  Pulse 68  Temp(Src) 98.1 F (36.7 C) (Oral)  Wt 139 lb (63.05 kg)  SpO2 98% Wt Readings from Last 3 Encounters:  04/21/15 139 lb (63.05 kg)  01/11/15 136 lb (61.689 kg)  10/29/14 138 lb (62.596 kg)    General: Appears her stated age, well developed, well nourished in NAD. Cardiovascular:  Normal rate and rhythm. S1,S2 noted.  No murmur, rubs or gallops noted.  Pulmonary/Chest: Normal effort and positive vesicular breath sounds. No respiratory distress. No wheezes, rales or ronchi noted.  Abdomen: Soft and nontender. Hypoactive bowel sounds, no bruits noted. No distention or masses noted.    BMET    Component Value Date/Time   NA 139 03/24/2015 1113   K 3.9 03/24/2015 1113   CL 104 03/24/2015 1113   CO2 27 03/24/2015 1113   GLUCOSE 127* 03/24/2015 1113   BUN 16 03/24/2015 1113   CREATININE 0.98 03/24/2015 1113   CREATININE 0.78 06/14/2012 1011   CALCIUM 9.6 03/24/2015 1113   GFRNONAA >60 03/24/2015 1113   GFRAA >60 03/24/2015 1113    Lipid Panel     Component Value Date/Time   CHOL 158 01/11/2015 0917   TRIG 66.0 01/11/2015 0917   HDL 47.40 01/11/2015 0917   CHOLHDL 3 01/11/2015 0917   VLDL 13.2 01/11/2015 0917   LDLCALC 97 01/11/2015 0917    CBC    Component Value Date/Time   WBC 5.8 03/24/2015 1113   RBC 5.22* 03/24/2015 1113   HGB 14.4 03/24/2015 1113   HCT 43.8 03/24/2015 1113   PLT 264 03/24/2015 1113   MCV 83.9 03/24/2015 1113   MCH 27.6 03/24/2015 1113   MCHC 32.9 03/24/2015 1113   RDW 14.6 03/24/2015 1113   LYMPHSABS 2.4 09/24/2014 1036   MONOABS 0.5 09/24/2014 1036   EOSABS 0.1 09/24/2014 1036   BASOSABS 0.0 09/24/2014 1036    Hgb A1C Lab Results  Component Value Date   HGBA1C 5.8 01/11/2015        Assessment & Plan:   IBS- C:  Discussed high fiber diet- handout given Start Linzess 145 mg every other day Update me in 2 weeks with how you are doing May benefit from a referral to GI  RTC as needed or if symptoms persist or worsen

## 2015-04-21 NOTE — Progress Notes (Signed)
Pre visit review using our clinic review tool, if applicable. No additional management support is needed unless otherwise documented below in the visit note. 

## 2015-04-28 ENCOUNTER — Telehealth: Payer: Self-pay

## 2015-04-28 NOTE — Telephone Encounter (Signed)
PA has been submitted through [email or web address] 

## 2015-04-28 NOTE — Telephone Encounter (Signed)
Received fax stating that medication has been approved through 10/2015--confirmed with the pharmacy that Rx went through

## 2015-04-29 ENCOUNTER — Telehealth: Payer: Self-pay | Admitting: Internal Medicine

## 2015-04-29 NOTE — Telephone Encounter (Signed)
Mel did you call her?

## 2015-04-29 NOTE — Telephone Encounter (Signed)
Spoke to pt already in reference to her Linzess PA approval

## 2015-04-29 NOTE — Telephone Encounter (Signed)
Pt called returning someone's call from yesterday. She said she did not get a voicemail.  Pt also wanted to pass a message along that she is still having abdominal pain, she has tried a suppository and other remedies, but still has not been able to use the bathroom.  You can reach her at [phone].

## 2015-05-15 IMAGING — US US BREAST LTD UNI LEFT INC AXILLA
1 series · 6 of 6 positions shown · non-contrast
Comparison: Patient had a retroareolar left breast abscess and has
prior ultrasounds of this area.

CLINICAL DATA: Focal area of tenderness and fullness in the upper
inner left breast, noted for approximately 1 month. Patient does not
believe this lump was present prior to this.

EXAM:
ULTRASOUND OF THE LEFT BREAST

[Series 1: advbreast · 6 of 6 slices shown]
[im 1/6]
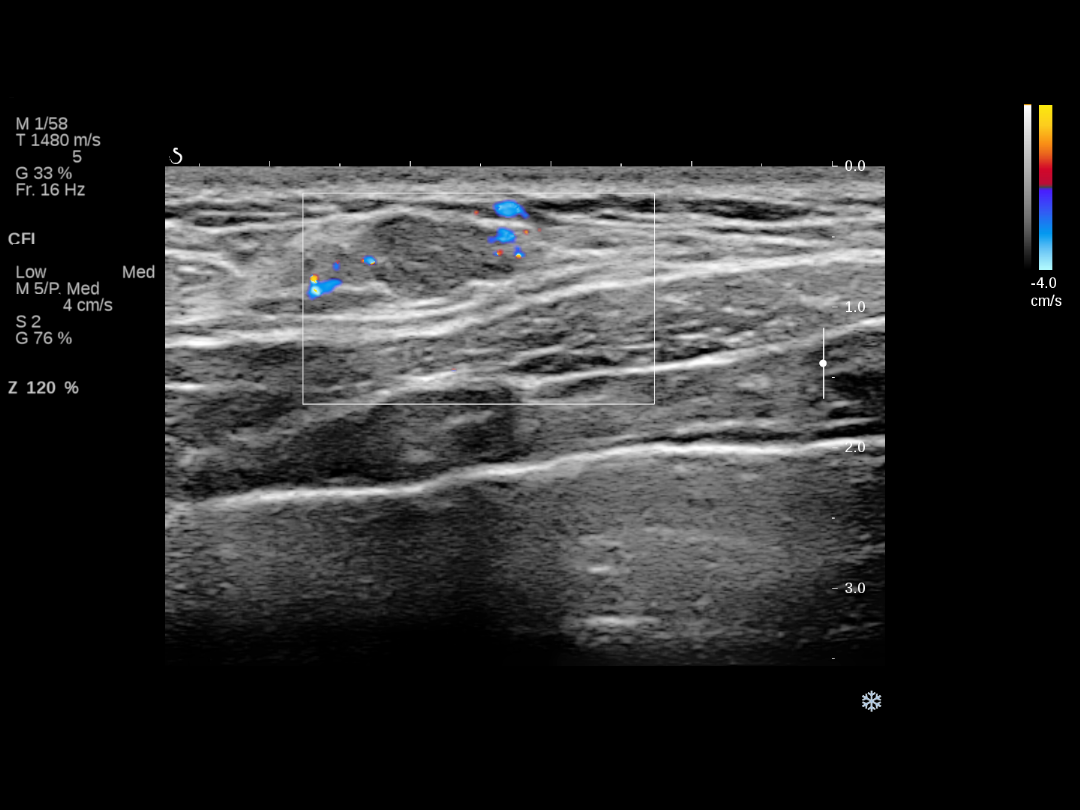
[im 2/6]
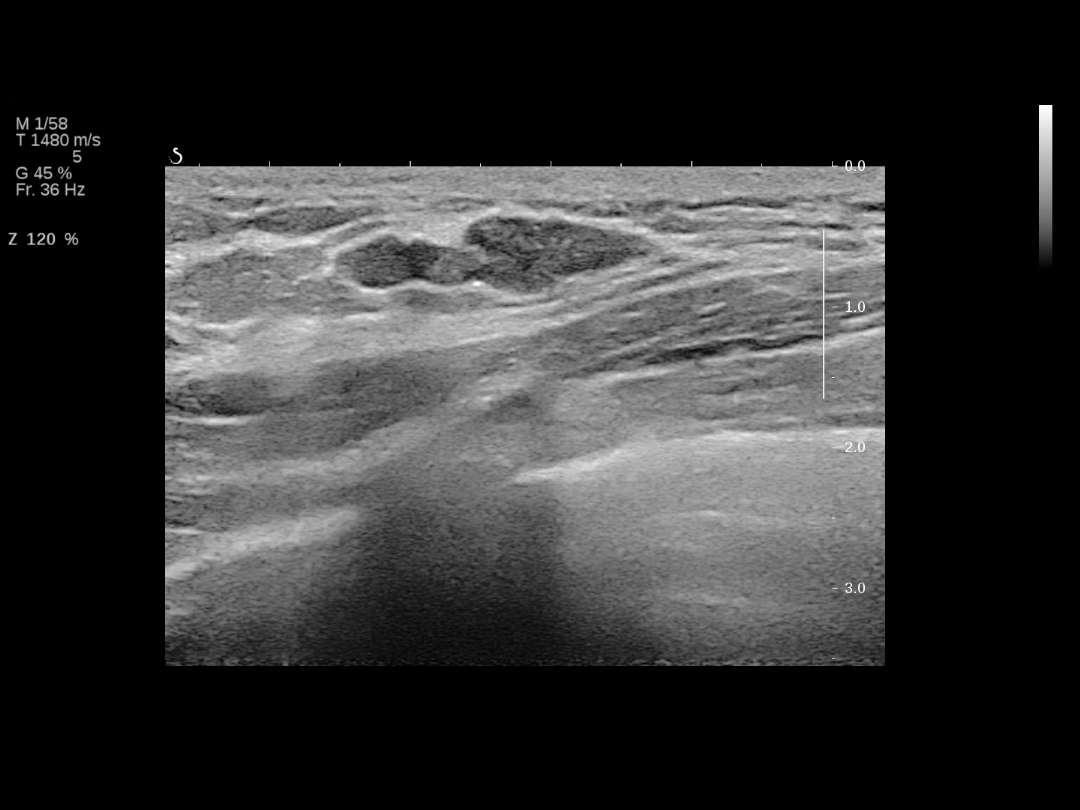
[im 3/6]
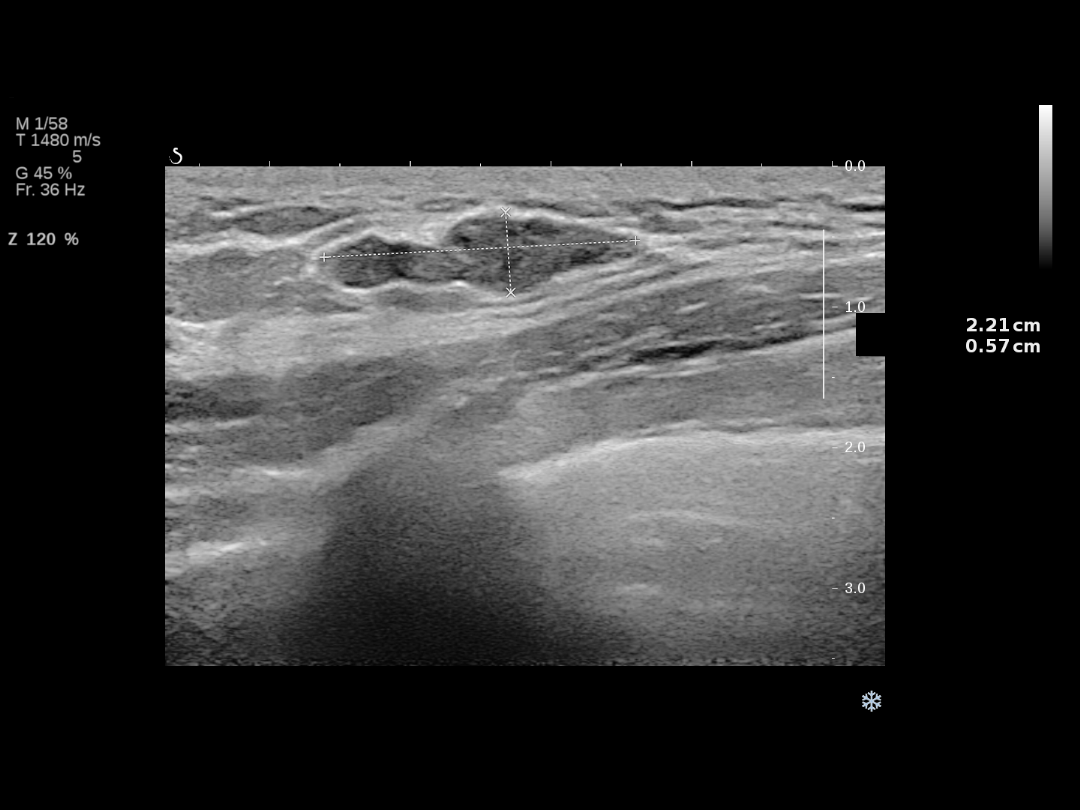
[im 4/6]
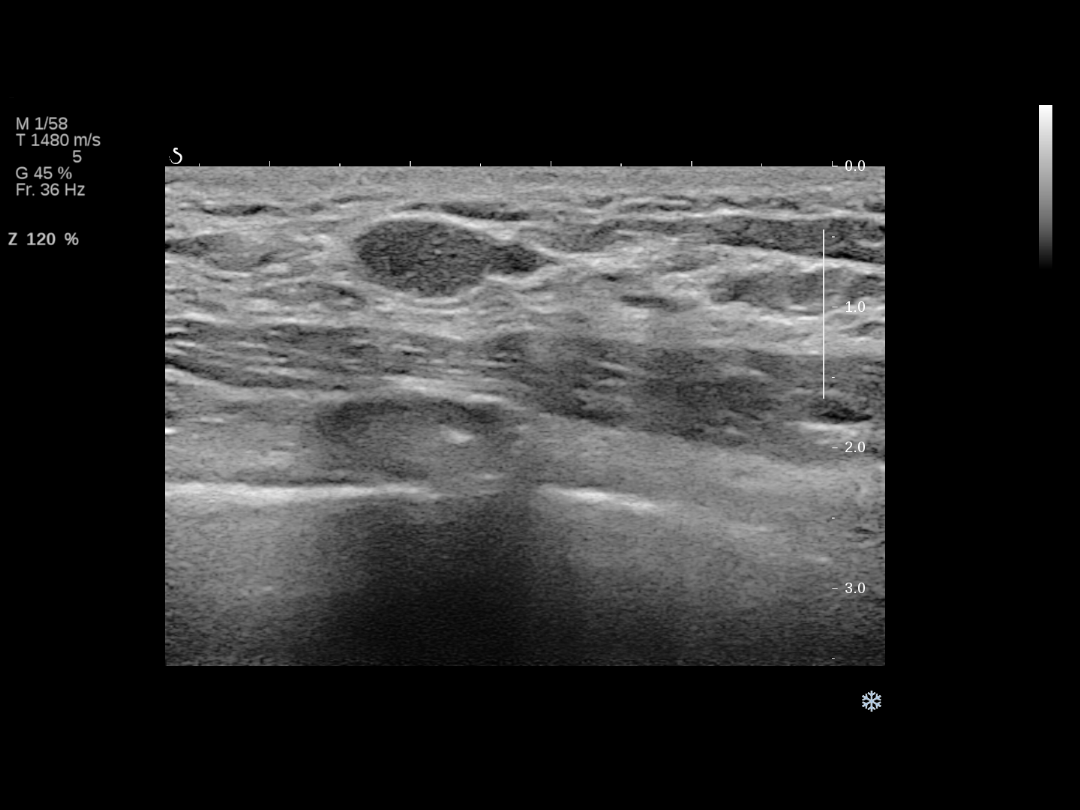
[im 5/6]
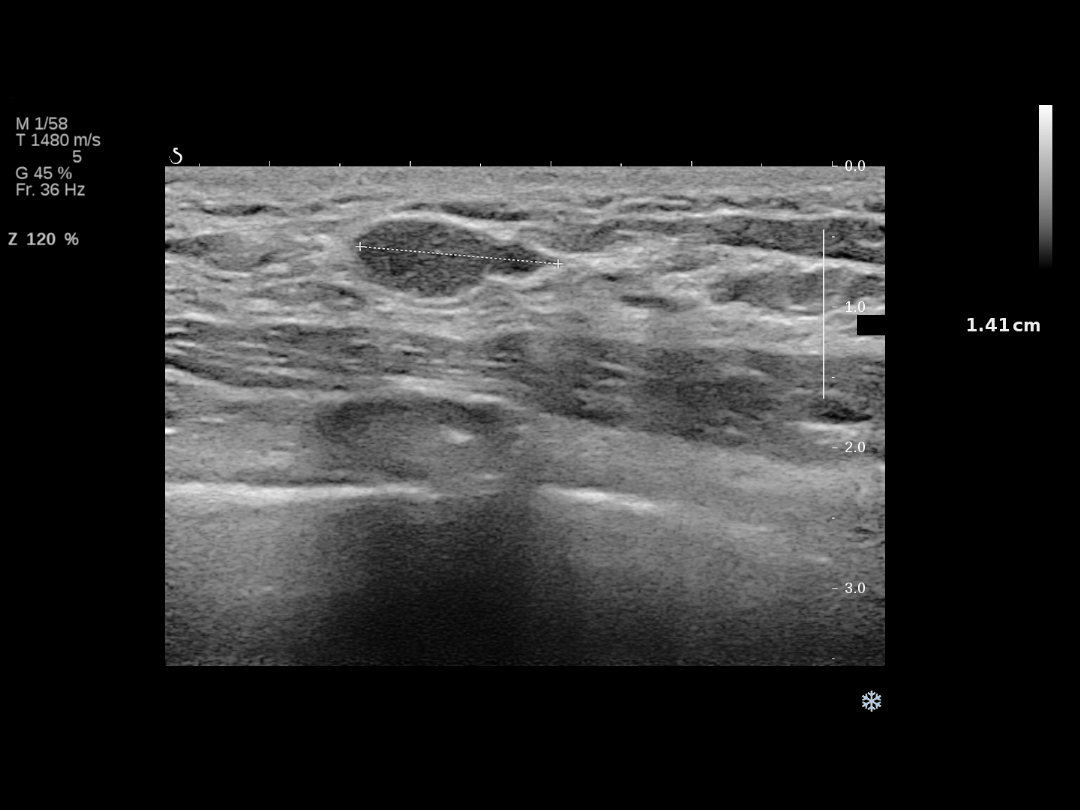
[im 6/6]
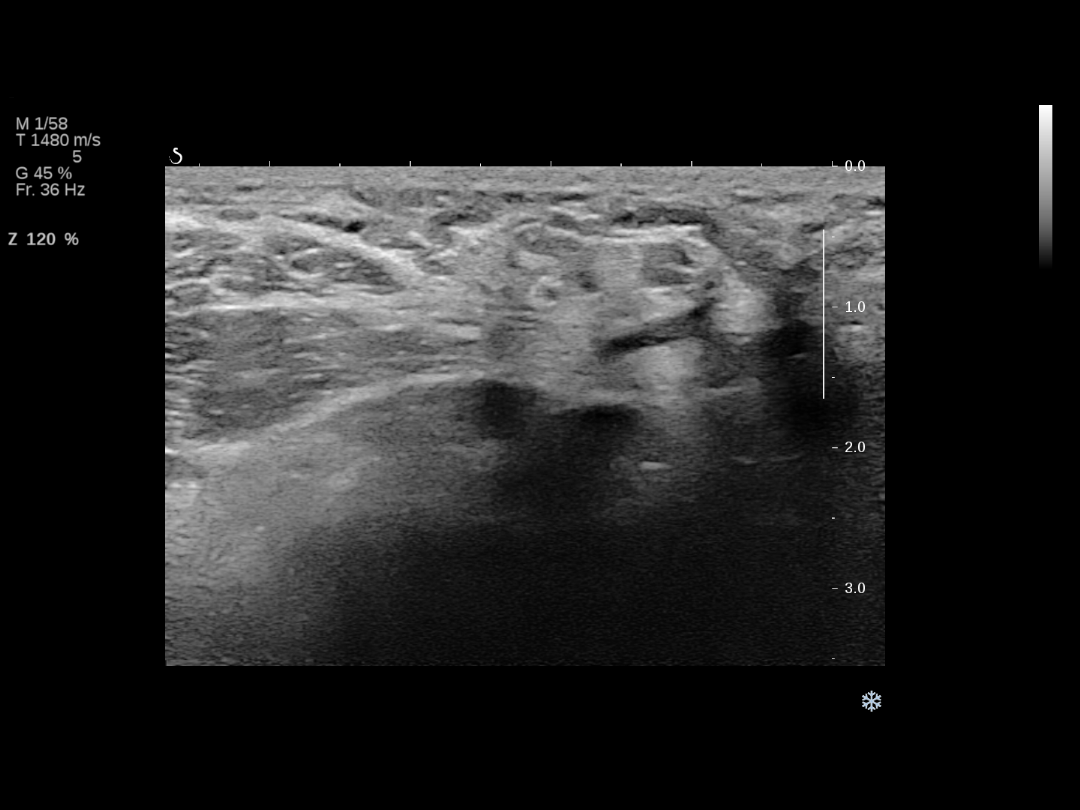

[6 of 6 positions shown; findings below may reference images not displayed]

Patient also had a solid-appearing
lesion in the 4 o'clock position of the left breast noted in September 2009.
FINDINGS: On physical exam, there is a small, tender, mobile smooth mass in
the left breast at 10 o'clock, which represents the patient's
reported palpable abnormality.

Ultrasound is performed, showing a mildly hypoechoic, circumscribed,
lobulated mass, long axis parallel to the skin, measuring 2.2 cm x
0.6 cm x 1.4 cm. Mesh areas moderate blood flow on color Doppler
analysis. Ultrasound of the left axilla shows no masses or abnormal
or enlarged lymph nodes.
IMPRESSION: 1. Benign appearing left breast mass, most likely a fibroadenoma.
However, the patient please at this mass is new, not present the for
1 month ago. It also has moderate blood flow on color Doppler
analysis. For these reasons, biopsy is recommended.

RECOMMENDATION:
Ultrasound-guided core needle biopsy of the left breast mass.

I have discussed the findings and recommendations with the patient.
Results were also provided in writing at the conclusion of the
visit. If applicable, a reminder letter will be sent to the patient
regarding the next appointment.

BI-RADS CATEGORY  4: Suspicious abnormality - biopsy should be
considered.

## 2015-05-25 IMAGING — MG MM DIGITAL DIAGNOSTIC UNILAT*L*
2 series · 2 of 2 positions shown · non-contrast
Comparison: Previous exams

CLINICAL DATA: Post ultrasound-guided left breast biopsy 10 o'clock
location.

EXAM:
DIAGNOSTIC left MAMMOGRAM POST ULTRASOUND BIOPSY

[L CC]
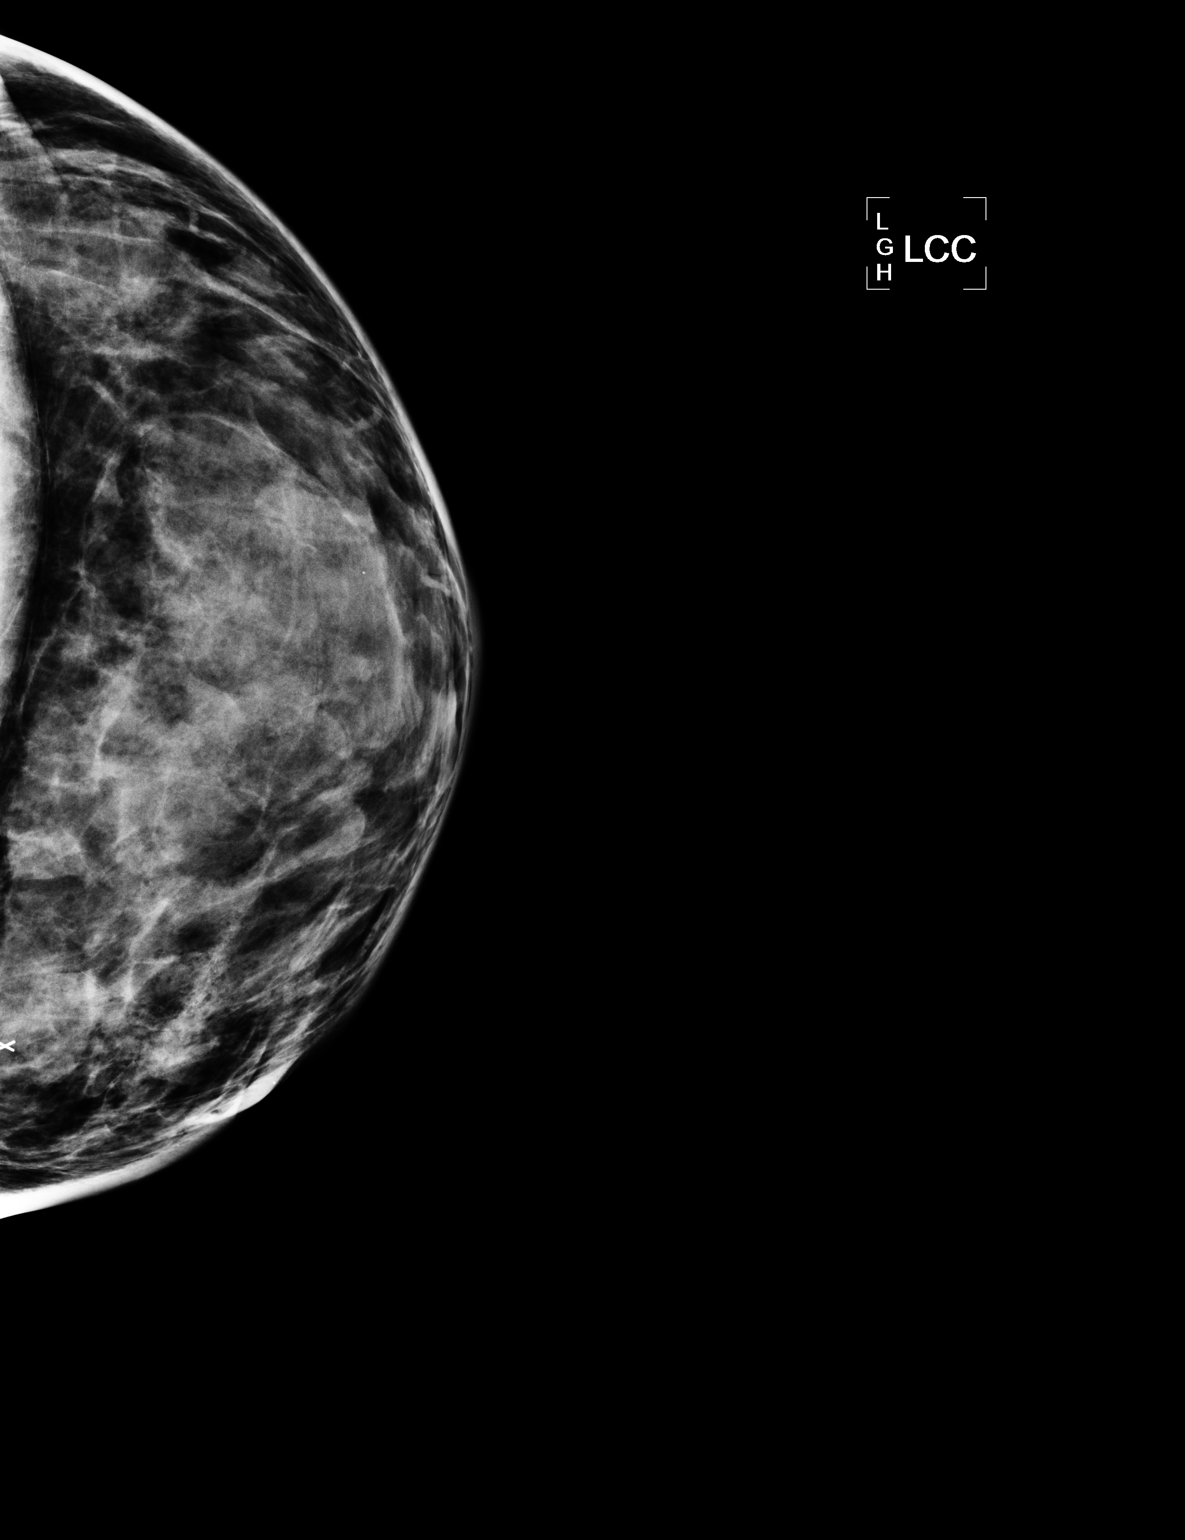

[L ML]
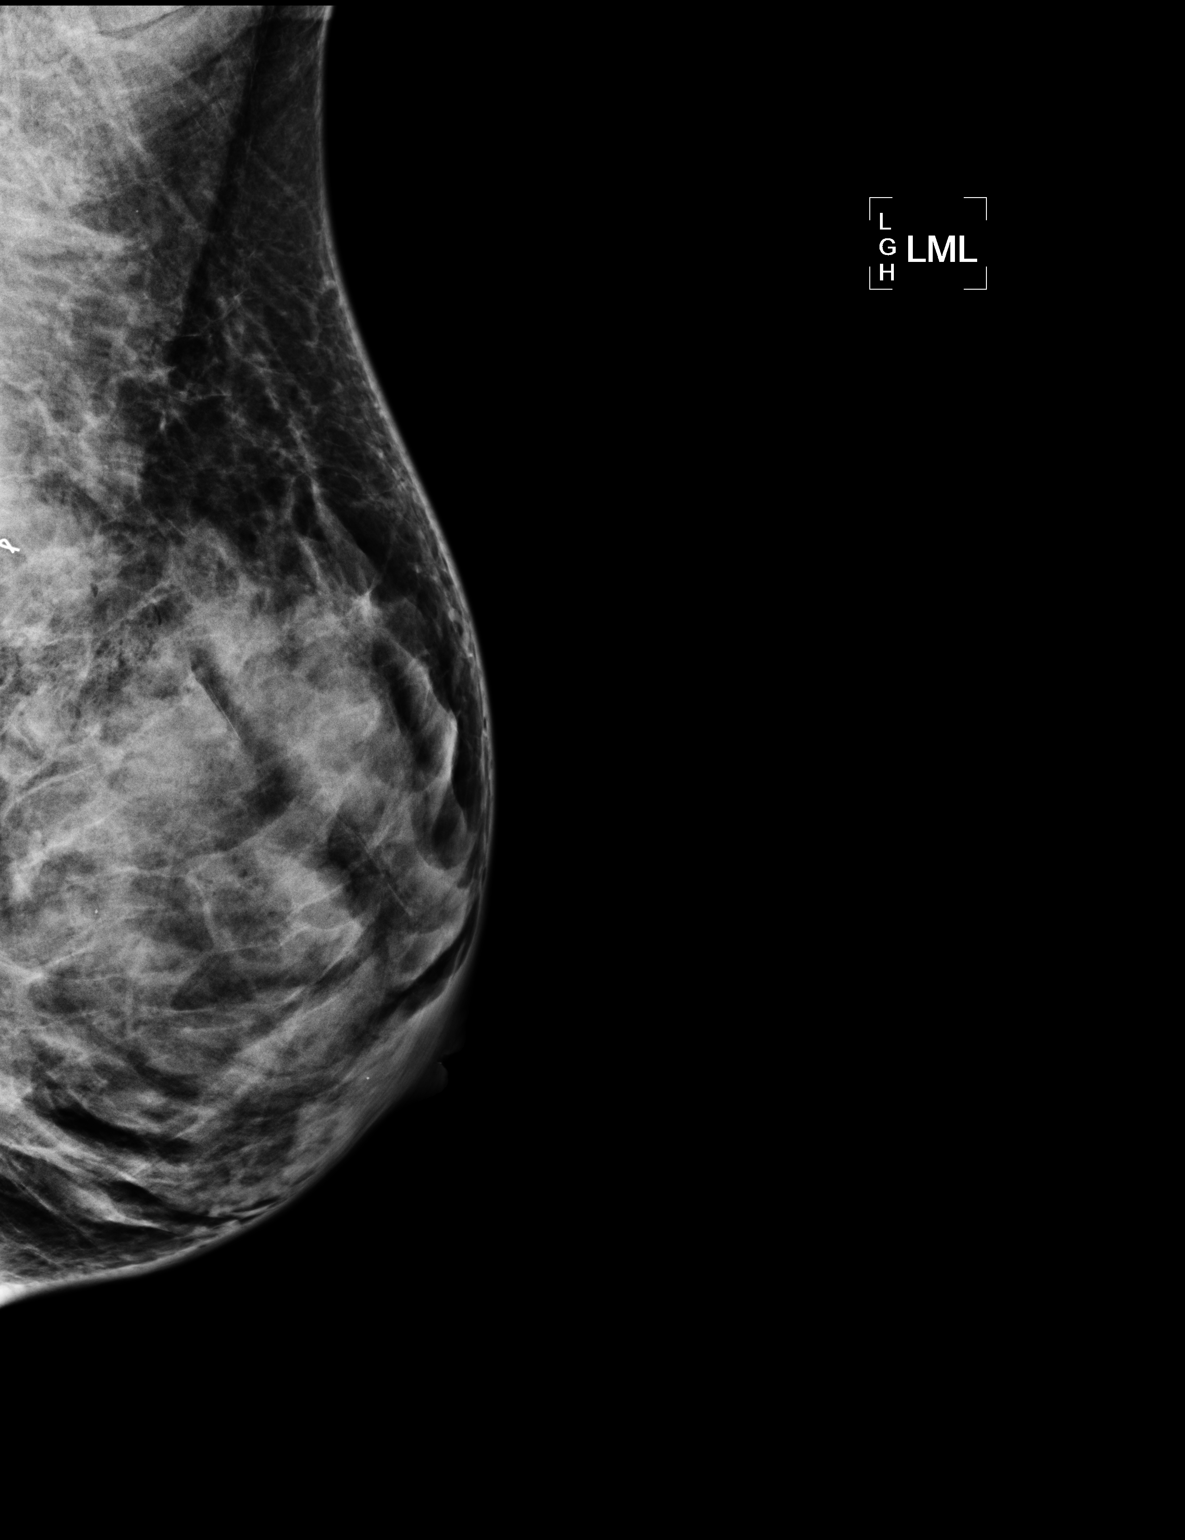

[2 of 2 positions shown; findings below may reference images not displayed]

FINDINGS: Mammographic images were obtained following ultrasound guided biopsy
of left breast mass 10 o'clock location, with appropriate position
of the ribbon shaped clip.
IMPRESSION: Appropriate ribbon shaped clip location, left breast 10 o'clock
location.

Final Assessment: Post Procedure Mammograms for Marker Placement

## 2015-09-30 ENCOUNTER — Other Ambulatory Visit: Payer: Self-pay | Admitting: Internal Medicine

## 2016-01-10 ENCOUNTER — Ambulatory Visit: Payer: 59 | Admitting: Internal Medicine

## 2016-01-21 ENCOUNTER — Other Ambulatory Visit: Payer: Self-pay | Admitting: Internal Medicine

## 2016-01-21 ENCOUNTER — Ambulatory Visit: Payer: 59 | Admitting: Internal Medicine

## 2016-01-21 NOTE — Progress Notes (Deleted)
Subjective:    Patient ID: [name], female    DOB: [birth year], 30 y.o.   MRN: [id number]  HPI  Pt presents to the clinic today with c/o stomach issues. She has a history of IBS and is on Linzess.  Review of Systems      Past Medical History:  Diagnosis Date  . Chicken pox   . Elevated prolactin level (HCC)    30 range 12/2008, normal X multiple repeats, most recent 18 04/2011  . Fibroadenoma of breast    LEFT  . IBS (irritable bowel syndrome)   . LGSIL (low grade squamous intraepithelial dysplasia) 12/2008, 02/2011   C&B WITH LGSIL 02/2011  . MRSA (methicillin resistant staph aureus) culture positive 04/2014   vulvar abscess  . STD (sexually transmitted disease)    History of Chlamydia    Current Outpatient Prescriptions  Medication Sig Dispense Refill  . DRYSOL 20 % external solution APPLY TOPICALLY AT BEDTIME 60 mL 0  . Linaclotide (LINZESS) 145 MCG CAPS capsule Take 1 capsule (145 mcg total) by mouth every other day. 30 capsule 1  . promethazine (PHENERGAN) 25 MG tablet Take 1 tablet (25 mg total) by mouth every 6 (six) hours as needed for nausea or vomiting. (Patient not taking: Reported on 04/21/2015) 8 tablet 0  . Wheat Dextrin (BENEFIBER PO) Take 1 each by mouth daily.     No current facility-administered medications for this visit.     Allergies  Allergen Reactions  . Banana Itching    Family History  Problem Relation Age of Onset  . Other Brother     TB  . Aneurysm Maternal Grandfather   . Alcohol abuse Neg Hx   . Arthritis Neg Hx   . Asthma Neg Hx   . Birth defects Neg Hx   . Cancer Neg Hx   . COPD Neg Hx   . Depression Neg Hx   . Diabetes Neg Hx   . Drug abuse Neg Hx   . Early death Neg Hx   . Hearing loss Neg Hx   . Heart disease Neg Hx   . Hyperlipidemia Neg Hx   . Hypertension Neg Hx   . Kidney disease Neg Hx   . Learning disabilities Neg Hx   . Mental illness Neg Hx   . Mental retardation Neg Hx   . Miscarriages / Stillbirths  Neg Hx   . Vision loss Neg Hx   . Varicose Veins Neg Hx     Social History   Social History  . Marital status: Single    Spouse name: N/A  . Number of children: 1  . Years of education: N/A   Occupational History  . 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 Smoking status: Former Smoker    Years: 3.00    Types: Cigarettes    Quit date: 04/12/2010  . Smokeless tobacco: Never Used     Comment: Maybe 1 cigarette every 2 days  . Alcohol use 0.0 oz/week     Comment: Occasional  . Drug use: No  . Sexual activity: Yes    Partners: Male    Birth control/ protection: , Implant     Comment: 1st intercourse 30 yo-More than 5 partners   Other Topics Concern  . Not on file   Social History Narrative  . No narrative on file     Constitutional: Denies fever, malaise, fatigue, headache or abrupt weight changes.  HEENT: Denies eye pain, eye redness, ear pain, ringing in the ears, wax buildup, runny nose, nasal congestion, bloody nose, or sore throat. Respiratory: Denies difficulty breathing, shortness of breath, cough or sputum production.   Cardiovascular: Denies chest pain, chest tightness, palpitations or swelling in the hands or feet.  Gastrointestinal: Denies abdominal pain, bloating, constipation, diarrhea or blood in the stool.  GU: Denies urgency, frequency, pain with urination, burning sensation, blood in urine, odor or discharge. Musculoskeletal: Denies decrease in range of motion, difficulty with gait, muscle pain or joint pain and swelling.  Skin: Denies redness, rashes, lesions or ulcercations.  Neurological: Denies dizziness, difficulty with memory, difficulty with speech or problems with balance and coordination.  Psych: Denies anxiety, depression, SI/HI.  No other specific complaints in a complete review of systems (except as listed in HPI above).  Objective:   Physical Exam        Assessment & Plan:

## 2016-05-02 ENCOUNTER — Encounter: Payer: Self-pay | Admitting: Gynecology

## 2016-05-02 ENCOUNTER — Ambulatory Visit (INDEPENDENT_AMBULATORY_CARE_PROVIDER_SITE_OTHER): Payer: 59 | Admitting: Gynecology

## 2016-05-02 VITALS — BP 120/76

## 2016-05-02 DIAGNOSIS — Z3046 Encounter for surveillance of implantable subdermal contraceptive: Secondary | ICD-10-CM

## 2016-05-02 DIAGNOSIS — Z309 Encounter for contraceptive management, unspecified: Secondary | ICD-10-CM

## 2016-05-02 MED ORDER — NORETHIN ACE-ETH ESTRAD-FE 1-20 MG-MCG PO TABS: 1.0000 | ORAL_TABLET | Freq: Every day | ORAL | 1 refills | Status: DC

## 2016-05-02 NOTE — Progress Notes (Signed)
    [place] 04/26/1985 [id number]        30 y.o.  [id number] presents to have her Nexplanon removed. She wants to discuss birth control options. At this point does not want the Nexplanon replaced.  Past medical history,surgical history, problem list, medications, allergies, family history and social history were all reviewed and documented in the EPIC chart.  Directed ROS with pertinent positives and negatives documented in the history of present illness/assessment and plan.  Exam: Caryn Bee assistant Vitals:   05/02/16 1207  BP: 120/76   General appearance:  Normal  Procedure:  The removal process was reviewed with the patient and the potential risks were discussed to include bleeding, infection, damage to underlying neurovascular structures and the possibility that we may not be able to remove the rod or incomplete removal.  The left upper, inner arm was examined with the Nexplanon rod clearly palpated. The skin overlying the distal portion of the rod was cleansed with Betadine and infiltrated with 1% lidocaine. A small skin incision was made with a scalpel over the distal tip of the Nexplanon rod. The Nexplanon tip was subsequently delivered through the incision using blunt and sharp dissection and the tip was grasped with a forcep and the Nexplanon rod was removed intact, shown to the patient and discarded.  A Steri-Strip was used to close the small skin defect and a pressure dressing was applied with postoperative instructions given.   Assessment/Plan:  31 y.o. [id number] with successful removal of her Nexplanon rod. I reviewed options for contraception to include pill, patch, ring, Depo-Provera, replacement of Nexplanon and IUD. The pros/cons of each choice reviewed. The patient would like to start low-dose oral contraceptives for now. Loestrin 1/20 equivalent provided with one refill. Patient is overdue for her annual has this scheduled within the months. Patient instructed to  start the pills today and use backup contraception for at least the first month.    Anastasio Auerbach MD, 12:25 PM 05/02/2016

## 2016-05-02 NOTE — Patient Instructions (Signed)
Follow up for your annual exam as scheduled. 

## 2016-05-25 DIAGNOSIS — R87610 Atypical squamous cells of undetermined significance on cytologic smear of cervix (ASC-US): Secondary | ICD-10-CM

## 2016-05-25 HISTORY — DX: Atypical squamous cells of undetermined significance on cytologic smear of cervix (ASC-US): R87.610

## 2016-06-01 ENCOUNTER — Ambulatory Visit (INDEPENDENT_AMBULATORY_CARE_PROVIDER_SITE_OTHER): Payer: 59 | Admitting: Gynecology

## 2016-06-01 ENCOUNTER — Encounter: Payer: Self-pay | Admitting: Gynecology

## 2016-06-01 VITALS — BP 112/64 | Ht 66.0 in | Wt 156.0 lb

## 2016-06-01 DIAGNOSIS — Z113 Encounter for screening for infections with a predominantly sexual mode of transmission: Secondary | ICD-10-CM

## 2016-06-01 DIAGNOSIS — Z01419 Encounter for gynecological examination (general) (routine) without abnormal findings: Secondary | ICD-10-CM

## 2016-06-01 DIAGNOSIS — Z13220 Encounter for screening for lipoid disorders: Secondary | ICD-10-CM | POA: Diagnosis not present

## 2016-06-01 LAB — LIPID PANEL
Cholesterol: 158 mg/dL (ref <200)
HDL: 44 mg/dL — ABNORMAL LOW (ref >50)
LDL Cholesterol: 98 mg/dL (ref <100)
TRIGLYCERIDES: 78 mg/dL (ref <150)
Total CHOL/HDL Ratio: 3.6 Ratio (ref <5.0)
VLDL: 16 mg/dL (ref <30)

## 2016-06-01 LAB — CBC WITH DIFFERENTIAL/PLATELET
BASOS PCT: 0 %
Basophils Absolute: 0 cells/uL (ref 0–200)
EOS ABS: 213 {cells}/uL (ref 15–500)
Eosinophils Relative: 3 %
HCT: 37.8 % (ref 35.0–45.0)
HEMOGLOBIN: 12.2 g/dL (ref 11.7–15.5)
LYMPHS ABS: 3479 {cells}/uL (ref 850–3900)
Lymphocytes Relative: 49 %
MCH: 27.4 pg (ref 27.0–33.0)
MCHC: 32.3 g/dL (ref 32.0–36.0)
MCV: 84.9 fL (ref 80.0–100.0)
MPV: 8.6 fL (ref 7.5–12.5)
Monocytes Absolute: 568 cells/uL (ref 200–950)
Monocytes Relative: 8 %
NEUTROS ABS: 2840 {cells}/uL (ref 1500–7800)
Neutrophils Relative %: 40 %
PLATELETS: 281 10*3/uL (ref 140–400)
RBC: 4.45 MIL/uL (ref 3.80–5.10)
RDW: 14.3 % (ref 11.0–15.0)
WBC: 7.1 10*3/uL (ref 3.8–10.8)

## 2016-06-01 LAB — COMPREHENSIVE METABOLIC PANEL
ALBUMIN: 4.1 g/dL (ref 3.6–5.1)
ALK PHOS: 38 U/L (ref 33–115)
ALT: 12 U/L (ref 6–29)
AST: 24 U/L (ref 10–30)
BUN: 11 mg/dL (ref 7–25)
CHLORIDE: 104 mmol/L (ref 98–110)
CO2: 26 mmol/L (ref 20–31)
CREATININE: 0.92 mg/dL (ref 0.50–1.10)
Calcium: 8.7 mg/dL (ref 8.6–10.2)
Glucose, Bld: 88 mg/dL (ref 65–99)
POTASSIUM: 3.9 mmol/L (ref 3.5–5.3)
SODIUM: 139 mmol/L (ref 135–146)
TOTAL PROTEIN: 6.8 g/dL (ref 6.1–8.1)
Total Bilirubin: 0.5 mg/dL (ref 0.2–1.2)

## 2016-06-01 MED ORDER — NORETHIN ACE-ETH ESTRAD-FE 1-20 MG-MCG PO TABS: 1.0000 | ORAL_TABLET | Freq: Every day | ORAL | 12 refills | Status: DC

## 2016-06-01 NOTE — Addendum Note (Signed)
Addended by: Joaquin Music on: 06/01/2016 04:47 PM   Modules accepted: Orders

## 2016-06-01 NOTE — Progress Notes (Signed)
    [name] [birth year] [id number]        30 y.o.  [id number] for annual exam.    Past medical history,surgical history, problem list, medications, allergies, family history and social history were all reviewed and documented as reviewed in the EPIC chart.  ROS:  Performed with pertinent positives and negatives included in the history, assessment and plan.   Additional significant findings :  None   Exam: Caryn Bee assistant Vitals:   06/01/16 1609  BP: 112/64  Weight: 156 lb (70.8 kg)  Height: 5\' 6"  (1.676 m)   Body mass index is 25.18 kg/m.  General appearance:  Normal affect, orientation and appearance. Skin: Grossly normal HEENT: Without gross lesions.  No cervical or supraclavicular adenopathy. Thyroid normal.  Lungs:  Clear without wheezing, rales or rhonchi Cardiac: RR, without RMG Abdominal:  Soft, nontender, without masses, guarding, rebound, organomegaly or hernia Breasts:  Examined lying and sitting. Right without masses, retractions, discharge or axillary adenopathy. Left with small nodule upper inner breast. Mobile with no overlying skin changes. No nipple discharge or axillary adenopathy. Pelvic:  Ext, BUS, Vagina normal.  Cervix normal. GC/Chlamydia, Pap smear done  Uterus anteverted, normal size, shape and contour, midline and mobile nontender   Adnexa without masses or tenderness    Anus and perineum normal   Rectovaginal normal sphincter tone without palpated masses or tenderness.    Assessment/Plan:  31 y.o. [id number] female for annual exam with regular menses, oral contraceptives.   1. Contraception. Patient using low-dose oral contraceptives but is considering Nexplanon. Used previously without issues. Refill for Loestrin 1/20 equivalent provided 1 year. She will follow up for Nexplanon if she chooses. 2. Pap smear 2016. Pap smear done today. History of LGSIL 2010/2012 with colposcopic biopsy confirmation. 3. STD screening. Patient requests STD  screening. No known exposure wants to be screened. GC/Chlamydia, HIV, RPR, hepatitis B, hepatitis C done. 4. Health maintenance. Baseline CBC, CMP, lipid profile, urinalysis ordered with both STD blood work. Follow up in one year, sooner if she decides on Nexplanon.   Anastasio Auerbach MD, 4:32 PM 06/01/2016

## 2016-06-01 NOTE — Patient Instructions (Signed)

## 2016-06-01 NOTE — Addendum Note (Signed)
Addended by: Nelva Nay on: 06/01/2016 04:38 PM   Modules accepted: Orders

## 2016-06-02 LAB — PAP IG W/ RFLX HPV ASCU

## 2016-06-02 LAB — HEPATITIS B SURFACE ANTIGEN: HEP B S AG: NEGATIVE

## 2016-06-02 LAB — RPR

## 2016-06-02 LAB — HEPATITIS C ANTIBODY: HCV Ab: NEGATIVE

## 2016-06-02 LAB — HIV ANTIBODY (ROUTINE TESTING W REFLEX): HIV: NONREACTIVE

## 2016-06-02 LAB — GC/CHLAMYDIA PROBE AMP
CT Probe RNA: NOT DETECTED
GC PROBE AMP APTIMA: NOT DETECTED

## 2016-06-08 ENCOUNTER — Other Ambulatory Visit: Payer: Self-pay | Admitting: Gynecology

## 2016-06-08 ENCOUNTER — Encounter: Payer: Self-pay | Admitting: Gynecology

## 2016-06-08 LAB — HUMAN PAPILLOMAVIRUS, HIGH RISK: HPV DNA HIGH RISK: NOT DETECTED

## 2016-06-08 MED ORDER — FLUCONAZOLE 150 MG PO TABS: 150.0000 mg | ORAL_TABLET | Freq: Once | ORAL | 0 refills | Status: AC

## 2016-06-21 ENCOUNTER — Ambulatory Visit: Payer: 59 | Admitting: Gynecology

## 2016-07-04 ENCOUNTER — Telehealth: Payer: Self-pay | Admitting: *Deleted

## 2016-07-04 NOTE — Telephone Encounter (Signed)
As long as she has not missed any pills and she is absolutely sure she is not pregnant

## 2016-07-04 NOTE — Telephone Encounter (Signed)
Pt informed with the below note, pt said she is has been taking her pills on time daily and absolutely not pregnant.

## 2016-07-04 NOTE — Telephone Encounter (Signed)
Patient wishes to proceed with Nexplanon, currently taking loestrin Fe now, states pills are causes nausea, LMP: 06/20/16, asked if Nexplanon could be inserted if she is not on her cycle? Please advise

## 2016-07-20 ENCOUNTER — Encounter: Payer: Self-pay | Admitting: Gynecology

## 2016-07-20 ENCOUNTER — Ambulatory Visit (INDEPENDENT_AMBULATORY_CARE_PROVIDER_SITE_OTHER): Payer: 59 | Admitting: Gynecology

## 2016-07-20 VITALS — BP 104/68

## 2016-07-20 DIAGNOSIS — Z309 Encounter for contraceptive management, unspecified: Secondary | ICD-10-CM

## 2016-07-20 NOTE — Patient Instructions (Signed)
Follow up during your next menses for Nexplanon placement

## 2016-07-20 NOTE — Progress Notes (Signed)
Patient presents for Nexplanon. Recently restarted oral contraceptives. LMP 06/29/2016. Is sexually active. Would prefer patient follow up with her next menses to absolutely reassure she is not pregnant at the time of placement. Patient agrees to follow up with her next menses for placement.

## 2016-08-08 ENCOUNTER — Ambulatory Visit (INDEPENDENT_AMBULATORY_CARE_PROVIDER_SITE_OTHER): Payer: 59 | Admitting: Gynecology

## 2016-08-08 ENCOUNTER — Encounter: Payer: Self-pay | Admitting: Gynecology

## 2016-08-08 VITALS — BP 110/70

## 2016-08-08 DIAGNOSIS — Z30017 Encounter for initial prescription of implantable subdermal contraceptive: Secondary | ICD-10-CM

## 2016-08-08 HISTORY — PX: OTHER SURGICAL HISTORY: SHX169

## 2016-08-08 NOTE — Progress Notes (Signed)
    [place] Oct 13, 1985 [id number]        31 y.o.  [id number] presents for Nexplanon insertion. She previously has been counseled for her contraceptive options and she elects for nexplanon. I reviewed the Nexplanon insertional process and the side effects/risks. I reviewed irregular bleeding, insertion site infections, underlying neurovascular damage with permanent sequela, migration of the implant making removal difficult requiring surgery, the need to have it removed in 3 years under a separate procedure and lastly the risk of failure with pregnancy. Patient is currently on a normal menses and she is right-handed. She has read through and signed the consent form.  Procedure with Caryn Bee assistant: Left upper arm examined and marked according to manufacturer's recommendation. The insertion site was cleansed with Betadine solution and the insertional tract infiltrated with 1% lidocaine. The Nexplanon was placed according to manufacturer's recommendation without difficulty. The skin defect was closed with a Steri-Strip. The patient palpated the rod. A pressure dressing was applied and postoperative instructions give her.   Lot number:  [id number]     Anastasio Auerbach MD, 9:06 AM 08/08/2016

## 2016-08-08 NOTE — Patient Instructions (Signed)
Follow up if any issues from the Nexplanon insertion.

## 2016-09-06 ENCOUNTER — Encounter: Payer: Self-pay | Admitting: Gynecology

## 2016-10-18 ENCOUNTER — Other Ambulatory Visit: Payer: Self-pay | Admitting: Internal Medicine

## 2016-10-18 DIAGNOSIS — R61 Generalized hyperhidrosis: Secondary | ICD-10-CM

## 2016-12-06 ENCOUNTER — Ambulatory Visit (INDEPENDENT_AMBULATORY_CARE_PROVIDER_SITE_OTHER): Payer: 59 | Admitting: Internal Medicine

## 2016-12-06 ENCOUNTER — Encounter: Payer: Self-pay | Admitting: Internal Medicine

## 2016-12-06 VITALS — BP 112/70 | HR 70 | Temp 98.3°F | Ht 64.0 in | Wt 153.0 lb

## 2016-12-06 DIAGNOSIS — R61 Generalized hyperhidrosis: Secondary | ICD-10-CM

## 2016-12-06 DIAGNOSIS — Z0000 Encounter for general adult medical examination without abnormal findings: Secondary | ICD-10-CM | POA: Diagnosis not present

## 2016-12-06 DIAGNOSIS — Z87898 Personal history of other specified conditions: Secondary | ICD-10-CM

## 2016-12-06 DIAGNOSIS — K581 Irritable bowel syndrome with constipation: Secondary | ICD-10-CM | POA: Diagnosis not present

## 2016-12-06 MED ORDER — ALUMINUM CHLORIDE 20 % EX SOLN: 1.0000 "application " | Freq: Every day | CUTANEOUS | 5 refills | Status: DC

## 2016-12-06 NOTE — Assessment & Plan Note (Signed)
Controlled on Drysol Refilled today

## 2016-12-06 NOTE — Progress Notes (Signed)
Subjective:    Patient ID: [name], female    DOB: 04/18/1986, 31 y.o.   MRN: [id number]  HPI  Pt presents to the clinic today for her annual exam. She is also due to follow up chronic conditions.  IBS: She reports this in managed with diet and Benefiber. She does not take any prescription medications for this.   Excessive Sweating: She is using Drysol as prescribed. She reports it works fairly well. She would like a refill of this today.  She also reports she has a breast clip in her left breast which is causing pain. She would like to have this removed.  Flu: 01/2013 Tetanus: 11/2012 Pap smear: 05/2016 Dentist: as needed  Diet: She does eat meat. She consumes fruits and veggies daily. She tries to avoid fried foods. She drinks mostly water. Exercise: She does a workout video 5 -6 days per week.   Review of Systems  Past Medical History:  Diagnosis Date  . ASCUS of cervix with negative high risk HPV 05/2016  . Chicken pox   . Elevated prolactin level (HCC)    30 range 12/2008, normal X multiple repeats, most recent 18 04/2011  . Fibroadenoma of breast    LEFT  . IBS (irritable bowel syndrome)   . LGSIL (low grade squamous intraepithelial dysplasia) 12/2008, 02/2011   C&B WITH LGSIL 02/2011  . MRSA (methicillin resistant staph aureus) culture positive 04/2014   vulvar abscess  . STD (sexually transmitted disease)    History of Chlamydia    Current Outpatient Prescriptions  Medication Sig Dispense Refill  . DRYSOL 20 % external solution APPLY TOPICALLY AT BEDTIME 60 mL 0  . norethindrone-ethinyl estradiol (JUNEL FE,GILDESS FE,LOESTRIN FE) 1-20 MG-MCG tablet Take 1 tablet by mouth daily. 1 Package 12   No current facility-administered medications for this visit.     Allergies  Allergen Reactions  . Banana Itching    Family History  Problem Relation Age of Onset  . Other Brother        TB  . Aneurysm Maternal Grandfather   . Alcohol abuse Neg Hx   .  Arthritis Neg Hx   . Asthma Neg Hx   . Birth defects Neg Hx   . Cancer Neg Hx   . COPD Neg Hx   . Depression Neg Hx   . Diabetes Neg Hx   . Drug abuse Neg Hx   . Early death Neg Hx   . Hearing loss Neg Hx   . Heart disease Neg Hx   . Hyperlipidemia Neg Hx   . Hypertension Neg Hx   . Kidney disease Neg Hx   . Learning disabilities Neg Hx   . Mental illness Neg Hx   . Mental retardation Neg Hx   . Miscarriages / Stillbirths Neg Hx   . Vision loss Neg Hx   . Varicose Veins Neg Hx     Social History   Social History  . Marital status: Single    Spouse name: N/A  . Number of children: 1  . Years of education: N/A   Occupational History  . 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 Smokeless tobacco: Never Used     Comment: Maybe 1 cigarette every 2 days  . Alcohol use 0.0 oz/week     Comment: Occasional  . Drug  use: No  . Sexual activity: Yes    Partners: Male    Birth control/ protection: , Implant     Comment: 1st intercourse 31 yo-More than 5 partners Nexplanon 08/08/2016   Other Topics Concern  . Not on file   Social History Narrative  . No narrative on file     Constitutional: Denies fever, malaise, fatigue, headache or abrupt weight changes.  HEENT: Denies eye pain, eye redness, ear pain, ringing in the ears, wax buildup, runny nose, nasal congestion, bloody nose, or sore throat. Respiratory: Denies difficulty breathing, shortness of breath, cough or sputum production.   Cardiovascular: Denies chest pain, chest tightness, palpitations or swelling in the hands or feet.  Gastrointestinal: Pt reports constipation. Denies abdominal pain, bloating, diarrhea or blood in the stool.  GU: Denies urgency, frequency, pain with urination, burning sensation, blood in urine, odor or discharge. Musculoskeletal: Denies decrease in range of motion, difficulty  with gait, muscle pain or joint pain and swelling.  Skin: Pt reports left breast pain. Denies redness, rashes, lesions or ulcercations.  Neurological: Denies dizziness, difficulty with memory, difficulty with speech or problems with balance and coordination.  Psych: Denies anxiety, depression, SI/HI.  No other specific complaints in a complete review of systems (except as listed in HPI above).     Objective:   Physical Exam  BP 112/70   Pulse 70   Temp 98.3 F (36.8 C) (Oral)   Ht 5\' 4"  (1.626 m)   Wt 153 lb (69.4 kg)   LMP 08/16/2016 Comment: Nexplanon  SpO2 100%   BMI 26.26 kg/m  Wt Readings from Last 3 Encounters:  12/06/16 153 lb (69.4 kg)  06/01/16 156 lb (70.8 kg)  04/21/15 139 lb (63 kg)    General: Appears her stated age, well developed, well nourished in NAD. Skin: Warm, dry and intact. Breast clip palpable in left breast. HEENT: Head: normal shape and size; Eyes: sclera white, no icterus, conjunctiva pink, PERRLA and EOMs intact; Ears: Tm's gray and intact, normal light reflex; Throat/Mouth: Teeth present, mucosa pink and moist, no exudate, lesions or ulcerations noted.  Neck:  Neck supple, trachea midline. No masses, lumps or thyromegaly present.  Cardiovascular: Normal rate and rhythm. S1,S2 noted.  No murmur, rubs or gallops noted. No JVD or BLE edema. Pulmonary/Chest: Normal effort and positive vesicular breath sounds. No respiratory distress. No wheezes, rales or ronchi noted.  Abdomen: Soft and nontender. Normal bowel sounds. No distention or masses noted. Liver, spleen and kidneys non palpable. Musculoskeletal: Strength t/t BUE/BLE.  No difficulty with gait.  Neurological: Alert and oriented. Cranial nerves II-XII grossly intact. Coordination normal.  Psychiatric: Mood and affect normal. Behavior is normal. Judgment and thought content normal.    BMET    Component Value Date/Time   NA 139 06/01/2016 1636   K 3.9 06/01/2016 1636   CL 104 06/01/2016 1636    CO2 26 06/01/2016 1636   GLUCOSE 88 06/01/2016 1636   BUN 11 06/01/2016 1636   CREATININE 0.92 06/01/2016 1636   CALCIUM 8.7 06/01/2016 1636   GFRNONAA >60 03/24/2015 1113   GFRAA >60 03/24/2015 1113    Lipid Panel     Component Value Date/Time   CHOL 158 06/01/2016 1636   TRIG 78 06/01/2016 1636   HDL 44 (L) 06/01/2016 1636   CHOLHDL 3.6 06/01/2016 1636   VLDL 16 06/01/2016 1636   LDLCALC 98 06/01/2016 1636    CBC    Component Value Date/Time   WBC 7.1 06/01/2016  1636   RBC 4.45 06/01/2016 1636   HGB 12.2 06/01/2016 1636   HCT 37.8 06/01/2016 1636   PLT 281 06/01/2016 1636   MCV 84.9 06/01/2016 1636   MCH 27.4 06/01/2016 1636   MCHC 32.3 06/01/2016 1636   RDW 14.3 06/01/2016 1636   LYMPHSABS 3,479 06/01/2016 1636   MONOABS 568 06/01/2016 1636   EOSABS 213 06/01/2016 1636   BASOSABS 0 06/01/2016 1636    Hgb A1C Lab Results  Component Value Date   HGBA1C 5.8 01/11/2015            Assessment & Plan:  Preventative Health Maintenance:  Encouraged her to get a flu shot in the fall Tetanus UTD Pap smear UTD Encouraged her to consume a balanced diet and exercise regimen Advised her to see an eye doctor and dentist annually She had labs 05/2016 reviewed  Breast Pain secondary to Clip:  Referral to gen surgery placed We will call you to set this up  RTC in 1 year, sooner if needed Webb Silversmith, NP

## 2016-12-06 NOTE — Assessment & Plan Note (Signed)
Continue diet, benefiber and water

## 2016-12-06 NOTE — Patient Instructions (Signed)

## 2017-02-09 ENCOUNTER — Other Ambulatory Visit: Payer: Self-pay | Admitting: General Surgery

## 2017-02-09 DIAGNOSIS — D242 Benign neoplasm of left breast: Secondary | ICD-10-CM

## 2017-02-14 ENCOUNTER — Ambulatory Visit
Admission: RE | Admit: 2017-02-14 | Discharge: 2017-02-14 | Disposition: A | Discharge status: Home / Self Care | Payer: 59 | Source: Ambulatory Visit | Attending: General Surgery | Admitting: General Surgery

## 2017-02-14 DIAGNOSIS — D242 Benign neoplasm of left breast: Secondary | ICD-10-CM

## 2017-06-01 ENCOUNTER — Telehealth: Payer: Self-pay | Admitting: Internal Medicine

## 2017-06-01 NOTE — Telephone Encounter (Signed)
Copied from [place]. Topic: Inquiry >> Jun 01, 2017 12:32 PM Neva Seat wrote: Drysol   Pt is asking if there is another kind of medication that can take it's place because it's been discontinued.

## 2017-06-01 NOTE — Telephone Encounter (Signed)
Pt has a question regarding the Drysol. Please advise

## 2017-06-01 NOTE — Telephone Encounter (Signed)
We can try Glycopyrrolate the only concern is that side effect. It not only dries up the excessive sweat, it can cause dry eyes, dry mouth, vaginal dryness and constipation. Let me know if she is interested in trying this.

## 2017-06-04 MED ORDER — GLYCOPYRROLATE 1 MG PO TABS: 1.0000 mg | ORAL_TABLET | Freq: Two times a day (BID) | ORAL | 2 refills | Status: DC

## 2017-06-04 NOTE — Addendum Note (Signed)
Addended by: Jearld Fenton on: 06/04/2017 02:11 PM   Modules accepted: Orders

## 2017-06-04 NOTE — Telephone Encounter (Signed)
Pt states she would like to try new medication and she will watch for side effects and let us know

## 2017-06-04 NOTE — Telephone Encounter (Signed)
Glycopyrrolate sent to pharmacy  

## 2017-06-26 ENCOUNTER — Telehealth: Payer: Self-pay | Admitting: Internal Medicine

## 2017-06-26 NOTE — Telephone Encounter (Signed)
Copied from [place] [id number]. Topic: Quick Communication - See Telephone Encounter >> Jun 26, 2017  1:31 PM Arletha Grippe wrote: CRM for notification. See Telephone encounter for:   06/26/17. Pt called - the glycopyrrolate (ROBINUL) 1 MG tablet is causing the pt to have severe constipation and vaginal dryness.  She would like to see if there is a substitute that can be called in.  Pharm is [facility] on [address].

## 2017-06-27 ENCOUNTER — Ambulatory Visit: Payer: Self-pay | Admitting: *Deleted

## 2017-06-27 NOTE — Telephone Encounter (Signed)
If she wants to discuss alternative treatment options, she can schedule an appt. As for the constipation, I would advise her to try a Fleets enema, If no BM in the next 24 hours, she may want to make an appt. If she develops nausea, vomiting, severe abdominal pain and is not even passing gas, she needs eval in the ER.

## 2017-06-27 NOTE — Telephone Encounter (Signed)
Started having constipation after taking medication; She states that she has been using stool softeners and laxatives with no relief; also having vaginal dryness; last dose taken on Friday 06/22/17; the pt also states that it did help with the sweating bu she is having theses unwanted side effects; will route to office for final disposition; pt can be contacted at [phone].  Reason for Disposition . Caller has NON-URGENT medication question about med that PCP prescribed and triager unable to answer question  Answer Assessment - Initial Assessment Questions 1. SYMPTOMS: "Do you have any symptoms?"     Constipation and vaginal dryness 2. SEVERITY: If symptoms are present, ask "Are they mild, moderate or severe?"     severe  Protocols used: MEDICATION QUESTION CALL-A-AH

## 2017-06-27 NOTE — Telephone Encounter (Signed)
I spoke with pt and name of med is glycopyrrolate 1 mg. Since pt stopped med on 06/22/17 vaginal dryness is better but still problems with constipation. Pt said the hyper hidrosis is bad, feet, hands and underarms. Pt request cb. [facility]

## 2017-06-27 NOTE — Telephone Encounter (Signed)
See nurse triage note dated 06/27/17.

## 2017-06-28 NOTE — Telephone Encounter (Signed)
Pt is aware as instructed and expressed understanding 

## 2017-06-28 NOTE — Telephone Encounter (Signed)
Pt states she can not come in for an appt at this time and she took a laxative and stool softener that helped produce a BM... She states she was getting great results from medication... Maybe she could try doing a daily stool softener to help with the constipation... Please advise

## 2017-06-28 NOTE — Telephone Encounter (Signed)
I would recommend daily Mirilax before a daily stool softener

## 2017-07-25 ENCOUNTER — Encounter: Payer: Self-pay | Admitting: Gynecology

## 2017-07-25 ENCOUNTER — Ambulatory Visit (INDEPENDENT_AMBULATORY_CARE_PROVIDER_SITE_OTHER): Payer: 59 | Admitting: Gynecology

## 2017-07-25 ENCOUNTER — Telehealth: Payer: Self-pay | Admitting: *Deleted

## 2017-07-25 VITALS — BP 114/70

## 2017-07-25 DIAGNOSIS — R102 Pelvic and perineal pain: Secondary | ICD-10-CM

## 2017-07-25 DIAGNOSIS — N941 Unspecified dyspareunia: Secondary | ICD-10-CM | POA: Diagnosis not present

## 2017-07-25 MED ORDER — METRONIDAZOLE 500 MG PO TABS: 500.0000 mg | ORAL_TABLET | Freq: Two times a day (BID) | ORAL | 0 refills | Status: DC

## 2017-07-25 NOTE — Telephone Encounter (Signed)
-----   Message from Anastasio Auerbach, MD sent at 07/25/2017 10:11 AM EDT ----- Tell patient that her urine analysis did show clue cells which indicates she may have a bacterial vaginosis.  I want to go ahead and cover her with Flagyl 500 mg twice daily times 7 days.  Avoid alcohol while taking.

## 2017-07-25 NOTE — Patient Instructions (Signed)
Follow up for ultrasound as scheduled 

## 2017-07-25 NOTE — Telephone Encounter (Signed)
Pt informed Rx sent. 

## 2017-07-25 NOTE — Progress Notes (Signed)
    [name] [birth year] [id number]        32 y.o.  [id number] presents with abdominal pain and dyspareunia.  Patient has been followed for irritable bowel.  Having bouts of constipation on and off and diffuse mid to lower abdominal discomfort.  No nausea or vomiting.  No urinary symptoms to include frequency, dysuria, urgency, low back pain, fever or chills.  Most recently over the past month or so she has had dyspareunia with deep insertion described as a generalized pelvic pain with every episode of intercourse.  Pain lingers afterwards.  Using Nexplanon for contraception, having no menses.    Past medical history,surgical history, problem list, medications, allergies, family history and social history were all reviewed and documented in the EPIC chart.  Directed ROS with pertinent positives and negatives documented in the history of present illness/assessment and plan.  Exam: [name] assistant Vitals:   07/25/17 0946  BP: 114/70   General appearance:  Normal Abdomen soft with mild lower abdominal discomfort.  No rebound, guarding, masses or organomegaly. Pelvic external BUS vagina normal.  Cervix normal.  GC/Chlamydia screen done.  Uterus normal size midline mobile nontender.  Adnexa without masses or tenderness.  Assessment/Plan:  32 y.o. [id number] with history of chronic abdominal pain attributable to irritable bowel syndrome having constipation on and off which seems to exacerbate her pain.  Now having deep dyspareunia with every coital episode.  Having no bleeding using Nexplanon for contraception.  We reviewed the differential to include irritable bowel related, GYN related such as ovarian cysts, endometriosis, pelvic adhesions.  I discussed with endometriosis with her Nexplanon and menstrual suppression I would imagine that endometriosis would likewise be suppressed although no guarantee.  Having no urinary symptoms.  Will screen with GC/chlamydia and urine analysis.  We will  start with GYN ultrasound to rule out nonpalpable abnormalities.  Various scenarios and possibilities were reviewed with the patient up to and including diagnostic laparoscopy.  Will rediscuss after the ultrasound.  Greater than 50% of my 15-minute visit was spent in direct face to face counseling and coordination of care with the patient.     Cassandra Auerbach MD, 10:02 AM 07/25/2017

## 2017-07-26 LAB — C. TRACHOMATIS/N. GONORRHOEAE RNA
C. trachomatis RNA, TMA: NOT DETECTED
N. gonorrhoeae RNA, TMA: NOT DETECTED

## 2017-07-27 LAB — URINALYSIS, COMPLETE W/RFL CULTURE
Bilirubin Urine: NEGATIVE
GLUCOSE, UA: NEGATIVE
Hgb urine dipstick: NEGATIVE
Hyaline Cast: NONE SEEN /LPF
Ketones, ur: NEGATIVE
Nitrites, Initial: NEGATIVE
PH: 5.5 (ref 5.0–8.0)
RBC / HPF: NONE SEEN /HPF (ref 0–2)
Specific Gravity, Urine: 1.026 (ref 1.001–1.03)

## 2017-07-27 LAB — URINE CULTURE
MICRO NUMBER:: 90417186
SPECIMEN QUALITY: ADEQUATE

## 2017-07-27 LAB — CULTURE INDICATED

## 2017-08-23 ENCOUNTER — Ambulatory Visit: Payer: 59 | Admitting: Gynecology

## 2017-08-23 ENCOUNTER — Other Ambulatory Visit: Payer: 59

## 2017-08-23 DIAGNOSIS — Z0289 Encounter for other administrative examinations: Secondary | ICD-10-CM

## 2017-09-21 ENCOUNTER — Telehealth: Payer: Self-pay | Admitting: Internal Medicine

## 2017-09-21 NOTE — Telephone Encounter (Signed)
Patient scheduled appointment on 09/26/17.

## 2017-09-21 NOTE — Telephone Encounter (Signed)
Copied from [place] [id number]. Topic: Quick Communication - See Telephone Encounter >> Sep 21, 2017 12:45 PM Rutherford Nail, NT wrote: CRM for notification. See Telephone encounter for: 09/21/17. Patient calling and would like a call back to discuss hyperhydrosis. States that the pills are just not working. Does not liking the way she is feeling when she takes those. Would like to know what other alternatives there are besides pills? Mentioned drysol, but is unsure if it was discontinued or not. Please advise. CB#: [phone]

## 2017-09-21 NOTE — Telephone Encounter (Signed)
June 27, 2017   Jearld Fenton, NP  to Me    Note    If she wants to discuss alternative treatment options, she can schedule an appt. As for the constipation, I would advise her to try a Fleets enema, If no BM in the next 24 hours, she may want to make an appt. If she develops nausea, vomiting, severe abdominal pain and is not even passing gas, she needs eval in the ER.

## 2017-09-25 ENCOUNTER — Encounter: Payer: 59 | Admitting: Gynecology

## 2017-09-25 DIAGNOSIS — Z0289 Encounter for other administrative examinations: Secondary | ICD-10-CM

## 2017-09-26 ENCOUNTER — Ambulatory Visit: Payer: 59 | Admitting: Internal Medicine

## 2017-09-26 DIAGNOSIS — Z0289 Encounter for other administrative examinations: Secondary | ICD-10-CM

## 2018-01-09 ENCOUNTER — Emergency Department (HOSPITAL_COMMUNITY)
Admission: EM | Admit: 2018-01-09 | Discharge: 2018-01-09 | Disposition: A | Discharge status: Home / Self Care | Payer: 59 | Attending: Emergency Medicine | Admitting: Emergency Medicine

## 2018-01-09 ENCOUNTER — Emergency Department (HOSPITAL_COMMUNITY): Payer: 59

## 2018-01-09 ENCOUNTER — Other Ambulatory Visit: Payer: Self-pay

## 2018-01-09 ENCOUNTER — Encounter (HOSPITAL_COMMUNITY): Payer: Self-pay

## 2018-01-09 DIAGNOSIS — M546 Pain in thoracic spine: Secondary | ICD-10-CM

## 2018-01-09 DIAGNOSIS — Z79899 Other long term (current) drug therapy: Secondary | ICD-10-CM | POA: Insufficient documentation

## 2018-01-09 DIAGNOSIS — R0602 Shortness of breath: Secondary | ICD-10-CM | POA: Diagnosis present

## 2018-01-09 DIAGNOSIS — Z87891 Personal history of nicotine dependence: Secondary | ICD-10-CM | POA: Insufficient documentation

## 2018-01-09 MED ORDER — DICLOFENAC SODIUM 1 % TD GEL: 2.0000 g | Freq: Three times a day (TID) | TRANSDERMAL | 0 refills | Status: DC | PRN

## 2018-01-09 MED ORDER — METHOCARBAMOL 500 MG PO TABS: 500.0000 mg | ORAL_TABLET | Freq: Two times a day (BID) | ORAL | 0 refills | Status: DC | PRN

## 2018-01-09 MED ORDER — NAPROXEN 500 MG PO TABS: 500.0000 mg | ORAL_TABLET | Freq: Two times a day (BID) | ORAL | 0 refills | Status: DC | PRN

## 2018-01-09 NOTE — ED Provider Notes (Signed)
[facility] DEPT Provider Note   CSN: [id number] Arrival date & time: 01/09/18  2019     History   Chief Complaint Chief Complaint  Patient presents with  . Shortness of Breath    HPI [name] is a 32 y.o. female.  The history is provided by the patient and medical records. No language interpreter was used.  Shortness of Breath    [name] is a 32 y.o. female  with a PMH as listed below who presents to the Emergency Department complaining of left-sided upper back pain which will occasionally radiate to her left shoulder over the last 2 months.  Pain is worse when she takes a deep breath and with certain positions.  Denies any chest pain.  She does report she will intermittently have shortness of breath, as if the pain takes her breath away.  She has not had any shortness of breath over the last 2 to 3 days.  No lower extremity swelling.  Not on OCPs or estrogen.  No recent travel, recent surgeries or prolonged immobilizations.  No history of clotting disorders, prior PE/DVT.  No medications taken prior to arrival for symptoms.   Past Medical History:  Diagnosis Date  . ASCUS of cervix with negative high risk HPV 05/2016  . Chicken pox   . Elevated prolactin level (HCC)    30 range 12/2008, normal X multiple repeats, most recent 18 04/2011  . Fibroadenoma of breast    LEFT  . IBS (irritable bowel syndrome)   . LGSIL (low grade squamous intraepithelial dysplasia) 12/2008, 02/2011   C&B WITH LGSIL 02/2011  . MRSA (methicillin resistant staph aureus) culture positive 04/2014   vulvar abscess  . STD (sexually transmitted disease)    History of Chlamydia    Patient Active Problem List   Diagnosis Date Noted  . Excessive sweating 06/18/2014  . IBS (irritable colon syndrome) 06/29/2011    Past Surgical History:  Procedure Laterality Date  . ADENOIDECTOMY     ASA CHILD  . CESAREAN SECTION N/A 03/06/2013   Procedure:  CESAREAN SECTION;  Surgeon: Melina Schools, MD;  Location: [facility];  Service: Obstetrics;  Laterality: N/A;  . COLPOSCOPY    . DILATION AND CURETTAGE OF UTERUS    . INDUCED ABORTION  [id number]   x2  . Nexplanon insertion  08/08/2016     OB History    Gravida  5   Para  3   Term  3   Preterm      AB  2   Living  2     SAB  1   TAB  1   Ectopic      Multiple      Live Births  2            Home Medications    Prior to Admission medications   Medication Sig Start Date End Date Taking? Authorizing Provider  diclofenac sodium (VOLTAREN) 1 % GEL Apply 2 g topically 3 (three) times daily as needed. 01/09/18   Uzoma Vivona, Ozella Almond, PA-C  etonogestrel (NEXPLANON) 68 MG IMPL implant 1 each by Subdermal route once.    [provider]  glycopyrrolate (ROBINUL) 1 MG tablet Take 1 tablet (1 mg total) by mouth 2 (two) times daily. Patient not taking: Reported on 07/25/2017 06/04/17   Jearld Fenton, NP  methocarbamol (ROBAXIN) 500 MG tablet Take 1 tablet (500 mg total) by mouth 2 (two) times daily as needed for  muscle spasms. 01/09/18   Tajai Suder, Ozella Almond, PA-C  metroNIDAZOLE (FLAGYL) 500 MG tablet Take 1 tablet (500 mg total) by mouth 2 (two) times daily. 07/25/17   Fontaine, Belinda Block, MD  naproxen (NAPROSYN) 500 MG tablet Take 1 tablet (500 mg total) by mouth 2 (two) times daily as needed. 01/09/18   Nabeel Gladson, Ozella Almond, PA-C    Family History Family History  Problem Relation Age of Onset  . Other Brother        TB  . Aneurysm Maternal Grandfather   . Alcohol abuse Neg Hx   . Arthritis Neg Hx   . Asthma Neg Hx   . Birth defects Neg Hx   . Cancer Neg Hx   . COPD Neg Hx   . Depression Neg Hx   . Diabetes Neg Hx   . Drug abuse Neg Hx   . Early death Neg Hx   . Hearing loss Neg Hx   . Heart disease Neg Hx   . Hyperlipidemia Neg Hx   . Hypertension Neg Hx   . Kidney disease Neg Hx   . Learning disabilities Neg Hx   . Mental illness Neg Hx   . Mental  retardation Neg Hx   . Miscarriages / Stillbirths Neg Hx   . Vision loss Neg Hx   . Varicose Veins Neg Hx     Social History Social History   Tobacco Use  . Smoking status: Former Smoker    Types: Cigarettes    Last attempt to quit: 04/12/2010    Years since quitting: 7.7  . Smokeless tobacco: Never Used  . Tobacco comment: Maybe 1 cigarette every 2 days  Substance Use Topics  . Alcohol use: Yes    Alcohol/week: 0.0 standard drinks    Comment: Occasional  . Drug use: No     Allergies   Banana   Review of Systems Review of Systems  Respiratory: Positive for shortness of breath.   Musculoskeletal: Positive for arthralgias and myalgias.  All other systems reviewed and are negative.    Physical Exam Updated Vital Signs BP 100/64   Pulse 64   Temp 98.8 F (37.1 C) (Oral)   Resp 15   SpO2 99%   Physical Exam  Constitutional: She is oriented to person, place, and time. She appears well-developed and well-nourished. No distress.  HENT:  Head: Normocephalic and atraumatic.  Cardiovascular: Normal rate, regular rhythm and normal heart sounds.  No murmur heard. Pulmonary/Chest: Effort normal and breath sounds normal. No stridor. No respiratory distress. She has no wheezes. She has no rales.  Abdominal: Soft. She exhibits no distension. There is no tenderness.  Musculoskeletal:  Tenderness to palpation to left rhomboid musculature.  Pain reproducible with outward rotation and flexion of the shoulder.  No midline C/T/L-spine tenderness.  Full range of motion and 5/5 muscle strength x4.   Neurological: She is alert and oriented to person, place, and time.  Skin: Skin is warm and dry.  Nursing note and vitals reviewed.    ED Treatments / Results  Labs (all labs ordered are listed, but only abnormal results are displayed) Labs Reviewed - No data to display  EKG EKG Interpretation  Date/Time:  Wednesday January 09 2018 20:51:34 EDT Ventricular Rate:  65 PR  Interval:    QRS Duration: 63 QT Interval:  375 QTC Calculation: 390 R Axis:   72 Text Interpretation:  Sinus rhythm Borderline T wave abnormalities similar to prior 10/14 Confirmed by Aletta Edouard [phone]) on  01/09/2018 11:21:05 PM   Radiology Dg Chest 2 View  Result Date: 01/09/2018 CLINICAL DATA:  Shortness of breath EXAM: CHEST - 2 VIEW COMPARISON:  None. FINDINGS: The heart size and mediastinal contours are within normal limits. Both lungs are clear. The visualized skeletal structures are unremarkable. IMPRESSION: No active cardiopulmonary disease. Electronically Signed   By: Donavan Foil M.D.   On: 01/09/2018 21:14    Procedures Procedures (including critical care time)  Medications Ordered in ED Medications - No data to display   Initial Impression / Assessment and Plan / ED Course  I have reviewed the triage vital signs and the nursing notes.  Pertinent labs & imaging results that were available during my care of the patient were reviewed by me and considered in my medical decision making (see chart for details).    [name] is a 32 y.o. female who presents to ED for left upper back pain which radiates to the shoulder over the last 2 months. She is afebrile, hemodynamically stable.  Chest x-ray without acute findings.  PE considered given her pain is worse with inspiration, however she is PERC negative and pain is reproducible on exam.  Likely musculoskeletal etiology. Evaluation does not show pathology that would require ongoing emergent intervention or inpatient treatment. Will treat her symptoms and have her follow-up with PCP if symptoms do not improve.  Reasons to return to the emergency department discussed and all questions answered.    Final Clinical Impressions(s) / ED Diagnoses   Final diagnoses:  Left-sided thoracic back pain, unspecified chronicity    ED Discharge Orders      methocarbamol (ROBAXIN) 500 MG tablet  2 times daily PRN     01/09/18  2327    naproxen (NAPROSYN) 500 MG tablet  2 times daily PRN     01/09/18 2330    diclofenac sodium (VOLTAREN) 1 % GEL  3 times daily PRN     01/09/18 2330           Marisha Renier, Ozella Almond, PA-C 01/09/18 2349    Veryl Speak, MD 01/10/18 [phone]

## 2018-01-09 NOTE — ED Triage Notes (Signed)
Pt reports SOB that started today. She also reports L upper back/shoulder pain when inhaling and in certain positions. Denies chest pain. Denies hx of asthma. A&Ox4.

## 2018-01-09 NOTE — Discharge Instructions (Signed)
It was my pleasure taking care of you today!   Naproxen as needed for pain. You can also apply the volatren gel to the area for pain relief.  Robaxin is your muscle relaxer to take as needed.   Please follow-up with your primary care doctor if your symptoms are not improving over the next week.  Return to the emergency department for chest pain, worsening in your breathing, new or worsening symptoms, any additional concerns.

## 2018-02-28 ENCOUNTER — Telehealth: Payer: Self-pay | Admitting: *Deleted

## 2018-02-28 NOTE — Telephone Encounter (Signed)
noted 

## 2018-02-28 NOTE — Telephone Encounter (Signed)
Pt contacted office with c/o anxiety. She states that there was a "cluster of ladybugs in her home that almost drove [her] crazy." She states that she has had it on her mind all day, and was so overwhelmed, that she left work. She denies any thoughts of harming herself or others. Pt denies any SHoB, but has been very nervous. Appt scheduled for 11/8 @ 1615

## 2018-03-01 ENCOUNTER — Encounter: Payer: Self-pay | Admitting: Internal Medicine

## 2018-03-01 ENCOUNTER — Ambulatory Visit (INDEPENDENT_AMBULATORY_CARE_PROVIDER_SITE_OTHER): Payer: 59 | Admitting: Internal Medicine

## 2018-03-01 VITALS — BP 106/64 | HR 67 | Temp 98.7°F | Wt 159.0 lb

## 2018-03-01 DIAGNOSIS — F410 Panic disorder [episodic paroxysmal anxiety] without agoraphobia: Secondary | ICD-10-CM | POA: Diagnosis not present

## 2018-03-01 DIAGNOSIS — F40298 Other specified phobia: Secondary | ICD-10-CM

## 2018-03-01 DIAGNOSIS — F419 Anxiety disorder, unspecified: Secondary | ICD-10-CM

## 2018-03-01 NOTE — Progress Notes (Signed)
Subjective:    Patient ID: [name], female    DOB: May 26, 1985, 32 y.o.   MRN: [id number]  HPI  Pt presents to the clinic today with c/o anxiety and panic attacks. She reports Wednesday night, she saw a cluster of lady bugs in her house. She reports she immediately panicked. She was sweaty, balled up in the fetal position in the corner of her house. She was unable to sleep the last 2 nights. She has not been able to work for the last 2 days because this has been so overwhelming for her. She did have her brother and boyfriend come vacuum the ladybugs up, she also had an exterminator come spray her house. She reports she does not have a fear of insects, but more a fear of circles and holes. She did not taken any medication OTC for this.   Review of Systems      Past Medical History:  Diagnosis Date  . ASCUS of cervix with negative high risk HPV 05/2016  . Chicken pox   . Elevated prolactin level (HCC)    30 range 12/2008, normal X multiple repeats, most recent 18 04/2011  . Fibroadenoma of breast    LEFT  . IBS (irritable bowel syndrome)   . LGSIL (low grade squamous intraepithelial dysplasia) 12/2008, 02/2011   C&B WITH LGSIL 02/2011  . MRSA (methicillin resistant staph aureus) culture positive 04/2014   vulvar abscess  . STD (sexually transmitted disease)    History of Chlamydia    Current Outpatient Medications  Medication Sig Dispense Refill  . etonogestrel (NEXPLANON) 68 MG IMPL implant 1 each by Subdermal route once.    . naproxen (NAPROSYN) 500 MG tablet Take 1 tablet (500 mg total) by mouth 2 (two) times daily as needed. 30 tablet 0   No current facility-administered medications for this visit.     Allergies  Allergen Reactions  . Banana Itching    Family History  Problem Relation Age of Onset  . Other Brother        TB  . Aneurysm Maternal Grandfather   . Alcohol abuse Neg Hx   . Arthritis Neg Hx   . Asthma Neg Hx   . Birth defects Neg Hx   . Cancer  Neg Hx   . COPD Neg Hx   . Depression Neg Hx   . Diabetes Neg Hx   . Drug abuse Neg Hx   . Early death Neg Hx   . Hearing loss Neg Hx   . Heart disease Neg Hx   . Hyperlipidemia Neg Hx   . Hypertension Neg Hx   . Kidney disease Neg Hx   . Learning disabilities Neg Hx   . Mental illness Neg Hx   . Mental retardation Neg Hx   . Miscarriages / Stillbirths Neg Hx   . Vision loss Neg Hx   . Varicose Veins Neg Hx     Social History   Socioeconomic History  . Marital status: Single    Spouse name: Not on file  . Number of children: 1  . Years of education: Not on file  . Highest education level: Not on file  Occupational History  . 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 Financial resource strain: Not on file  . Food insecurity:    Worry: Not on file    Inability: Not on file  . Transportation needs:    Medical: Not on file  Non-medical: Not on file  Tobacco Use  . Smoking status: Former Smoker    Types: Cigarettes    Last attempt to quit: 04/12/2010    Years since quitting: 7.8  . Smokeless tobacco: Never Used  . Tobacco comment: Maybe 1 cigarette every 2 days  Substance and Sexual Activity  . Alcohol use: Yes    Alcohol/week: 0.0 standard drinks    Comment: Occasional  . Drug use: No  . Sexual activity: Yes    Partners: Male    Birth control/protection: Implant    Comment: 1st intercourse 32 yo-More than 5 partners Nexplanon 08/08/2016  Lifestyle  . Physical activity:    Days per week: Not on file    Minutes per session: Not on file  . Stress: Not on file  Relationships  . Social connections:    Talks on phone: Not on file    Gets together: Not on file    Attends religious service: Not on file    Active member of club or organization: Not on file    Attends meetings of clubs or organizations: Not on file    Relationship status: Not on file  . Intimate partner violence:    Fear of current or ex partner: Not on file     Emotionally abused: Not on file    Physically abused: Not on file    Forced sexual activity: Not on file  Other Topics Concern  . Not on file  Social History Narrative  . Not on file     Constitutional: Denies fever, malaise, fatigue, headache or abrupt weight changes.  Respiratory: Pt reports shortness of breath. Denies difficulty breathing, cough or sputum production.   Cardiovascular: Denies chest pain, chest tightness, palpitations or swelling in the hands or feet.  Gastrointestinal: Pt reports nausea. Denies abdominal pain, bloating, constipation, diarrhea or blood in the stool.  Neurological: Denies dizziness, difficulty with memory, difficulty with speech or problems with balance and coordination.  Psych: Pt reports anxiety and panic attacks. Denies depression, SI/HI.  No other specific complaints in a complete review of systems (except as listed in HPI above).  Objective:   Physical Exam   BP 106/64   Pulse 67   Temp 98.7 F (37.1 C) (Oral)   Wt 159 lb (72.1 kg)   SpO2 98%   BMI 27.29 kg/m  Wt Readings from Last 3 Encounters:  03/01/18 159 lb (72.1 kg)  12/06/16 153 lb (69.4 kg)  06/01/16 156 lb (70.8 kg)    General: Appears her stated age, well developed, well nourished in NAD. Cardiovascular: Normal rate and rhythm. Pulmonary/Chest: Normal effort and positive vesicular breath sounds. No respiratory distress. No wheezes, rales or ronchi noted.  Neurological: Alert and oriented.  Psychiatric: She is anxious appearing today.   BMET    Component Value Date/Time   NA 139 06/01/2016 1636   K 3.9 06/01/2016 1636   CL 104 06/01/2016 1636   CO2 26 06/01/2016 1636   GLUCOSE 88 06/01/2016 1636   BUN 11 06/01/2016 1636   CREATININE 0.92 06/01/2016 1636   CALCIUM 8.7 06/01/2016 1636   GFRNONAA >60 03/24/2015 1113   GFRAA >60 03/24/2015 1113    Lipid Panel     Component Value Date/Time   CHOL 158 06/01/2016 1636   TRIG 78 06/01/2016 1636   HDL 44 (L)  06/01/2016 1636   CHOLHDL 3.6 06/01/2016 1636   VLDL 16 06/01/2016 1636   LDLCALC 98 06/01/2016 1636    CBC  Component Value Date/Time   WBC 7.1 06/01/2016 1636   RBC 4.45 06/01/2016 1636   HGB 12.2 06/01/2016 1636   HCT 37.8 06/01/2016 1636   PLT 281 06/01/2016 1636   MCV 84.9 06/01/2016 1636   MCH 27.4 06/01/2016 1636   MCHC 32.3 06/01/2016 1636   RDW 14.3 06/01/2016 1636   LYMPHSABS 3,479 06/01/2016 1636   MONOABS 568 06/01/2016 1636   EOSABS 213 06/01/2016 1636   BASOSABS 0 06/01/2016 1636    Hgb A1C Lab Results  Component Value Date   HGBA1C 5.8 01/11/2015           Assessment & Plan:   Anxiety, Panic Attacks, Trypophobia:  Referral placed to psychology for further evaluation, CBT Discussed distraction techniques  Support offered today  Return precautions discussed Webb Silversmith, NP

## 2018-03-01 NOTE — Patient Instructions (Signed)
Panic Attacks Panic attacks are sudden, short feelings of great fear or discomfort. You may have them for no reason when you are relaxed, when you are uneasy (anxious), or when you are sleeping. Follow these instructions at home:  Take all your medicines as told.  Check with your doctor before starting new medicines.  Keep all doctor visits. Contact a doctor if:  You are not able to take your medicines as told.  Your symptoms do not get better.  Your symptoms get worse. Get help right away if:  Your attacks seem different than your normal attacks.  You have thoughts about hurting yourself or others.  You take panic attack medicine and you have a side effect. This information is not intended to replace advice given to you by your health care provider. Make sure you discuss any questions you have with your health care provider. Document Released: 05/13/2010 Document Revised: 09/16/2015 Document Reviewed: 11/22/2012 Elsevier Interactive Patient Education  2017 Elsevier Inc.  

## 2018-09-09 ENCOUNTER — Other Ambulatory Visit: Payer: Self-pay

## 2018-09-10 ENCOUNTER — Ambulatory Visit (INDEPENDENT_AMBULATORY_CARE_PROVIDER_SITE_OTHER): Payer: 59 | Admitting: Gynecology

## 2018-09-10 ENCOUNTER — Encounter: Payer: Self-pay | Admitting: Gynecology

## 2018-09-10 VITALS — BP 118/72 | Ht 64.5 in | Wt 155.0 lb

## 2018-09-10 DIAGNOSIS — Z13220 Encounter for screening for lipoid disorders: Secondary | ICD-10-CM

## 2018-09-10 DIAGNOSIS — R87610 Atypical squamous cells of undetermined significance on cytologic smear of cervix (ASC-US): Secondary | ICD-10-CM | POA: Diagnosis not present

## 2018-09-10 DIAGNOSIS — Z01419 Encounter for gynecological examination (general) (routine) without abnormal findings: Secondary | ICD-10-CM

## 2018-09-10 DIAGNOSIS — Z202 Contact with and (suspected) exposure to infections with a predominantly sexual mode of transmission: Secondary | ICD-10-CM | POA: Diagnosis not present

## 2018-09-10 NOTE — Progress Notes (Signed)
    [place] 1985/06/14 [id number]        33 y.o.  [id number] for annual gynecologic exam.  Doing well without gynecologic complaints requests STD screening.  History of pelvic pain with dyspareunia last year.  Was to schedule ultrasound but never followed up.  Patient notes that her pain has totally resolved.  Past medical history,surgical history, problem list, medications, allergies, family history and social history were all reviewed and documented as reviewed in the EPIC chart.  ROS:  Performed with pertinent positives and negatives included in the history, assessment and plan.   Additional significant findings : None   Exam: Caryn Bee assistant Vitals:   09/10/18 0836  BP: 118/72  Weight: 155 lb (70.3 kg)  Height: 5' 4.5" (1.638 m)   Body mass index is 26.19 kg/m.  General appearance:  Normal affect, orientation and appearance. Skin: Grossly normal HEENT: Without gross lesions.  No cervical or supraclavicular adenopathy. Thyroid normal.  Lungs:  Clear without wheezing, rales or rhonchi Cardiac: RR, without RMG Abdominal:  Soft, nontender, without masses, guarding, rebound, organomegaly or hernia Breasts:  Examined lying and sitting without masses, retractions, discharge or axillary adenopathy. Pelvic:  Ext, BUS, Vagina: Normal  Cervix: Normal.  Pap smear done.  GC/chlamydia screen  Uterus: Anteverted, normal size, shape and contour, midline and mobile nontender   Adnexa: Without masses or tenderness    Anus and perineum: Normal   Rectovaginal: Normal sphincter tone without palpated masses or tenderness.    Assessment/Plan:  33 y.o. [id number] female for annual gynecologic exam.  With irregular menses, Nexplanon contraception  1. Nexplanon 07/2016.  Doing well with this.  Some mild irregularity in her cycles.  Due to be replaced/removed next year. 2. STD screening requested.  No specific exposure noted.  GC/chlamydia, HIV, RPR, hepatitis B, hepatitis C ordered. 3.  Breast exam normal.  SBE monthly reviewed.  Plan screening mammography at age 42.  No strong family history of breast cancer. 4. Pap smear 2018 showed ASCUS negative high risk HPV.  History of LGSIL 2010 and 2012.  Pap smear repeated today. 5. Health maintenance.  Baseline CBC CMP and lipid profile ordered along with her STD blood work.  Follow-up 1 year, sooner as needed.   Anastasio Auerbach MD, 9:04 AM 09/10/2018

## 2018-09-10 NOTE — Patient Instructions (Signed)
Follow-up in 1 year for annual exam, sooner as needed. 

## 2018-09-10 NOTE — Addendum Note (Signed)
Addended by: Nelva Nay on: 09/10/2018 09:38 AM   Modules accepted: Orders

## 2018-09-11 LAB — COMPREHENSIVE METABOLIC PANEL
AG Ratio: 1.8 (calc) (ref 1.0–2.5)
ALT: 9 U/L (ref 6–29)
AST: 14 U/L (ref 10–30)
Albumin: 4.3 g/dL (ref 3.6–5.1)
Alkaline phosphatase (APISO): 47 U/L (ref 31–125)
BUN: 8 mg/dL (ref 7–25)
CO2: 27 mmol/L (ref 20–32)
Calcium: 9 mg/dL (ref 8.6–10.2)
Chloride: 105 mmol/L (ref 98–110)
Creat: 0.86 mg/dL (ref 0.50–1.10)
Globulin: 2.4 g/dL (calc) (ref 1.9–3.7)
Glucose, Bld: 94 mg/dL (ref 65–99)
Potassium: 4.1 mmol/L (ref 3.5–5.3)
Sodium: 140 mmol/L (ref 135–146)
Total Bilirubin: 0.4 mg/dL (ref 0.2–1.2)
Total Protein: 6.7 g/dL (ref 6.1–8.1)

## 2018-09-11 LAB — CBC WITH DIFFERENTIAL/PLATELET
Absolute Monocytes: 615 cells/uL (ref 200–950)
Basophils Absolute: 38 cells/uL (ref 0–200)
Basophils Relative: 0.5 %
Eosinophils Absolute: 240 cells/uL (ref 15–500)
Eosinophils Relative: 3.2 %
HCT: 41.1 % (ref 35.0–45.0)
Hemoglobin: 13.1 g/dL (ref 11.7–15.5)
Lymphs Abs: 2873 cells/uL (ref 850–3900)
MCH: 27.3 pg (ref 27.0–33.0)
MCHC: 31.9 g/dL — ABNORMAL LOW (ref 32.0–36.0)
MCV: 85.6 fL (ref 80.0–100.0)
MPV: 9.4 fL (ref 7.5–12.5)
Monocytes Relative: 8.2 %
Neutro Abs: 3735 cells/uL (ref 1500–7800)
Neutrophils Relative %: 49.8 %
Platelets: 299 10*3/uL (ref 140–400)
RBC: 4.8 10*6/uL (ref 3.80–5.10)
RDW: 13.1 % (ref 11.0–15.0)
Total Lymphocyte: 38.3 %
WBC: 7.5 10*3/uL (ref 3.8–10.8)

## 2018-09-11 LAB — HEPATITIS C ANTIBODY
Hepatitis C Ab: NONREACTIVE
SIGNAL TO CUT-OFF: 0.08 (ref <1.00)

## 2018-09-11 LAB — LIPID PANEL
Cholesterol: 185 mg/dL (ref <200)
HDL: 48 mg/dL — ABNORMAL LOW (ref >=50)
LDL Cholesterol (Calc): 116 mg/dL (calc) — ABNORMAL HIGH
Non-HDL Cholesterol (Calc): 137 mg/dL (calc) — ABNORMAL HIGH (ref <130)
Total CHOL/HDL Ratio: 3.9 (calc) (ref <5.0)
Triglycerides: 103 mg/dL (ref <150)

## 2018-09-11 LAB — HIV ANTIBODY (ROUTINE TESTING W REFLEX): HIV 1&2 Ab, 4th Generation: NONREACTIVE

## 2018-09-11 LAB — HEPATITIS B SURFACE ANTIGEN: Hepatitis B Surface Ag: NONREACTIVE

## 2018-09-11 LAB — RPR: RPR Ser Ql: NONREACTIVE

## 2018-09-11 LAB — C. TRACHOMATIS/N. GONORRHOEAE RNA
C. trachomatis RNA, TMA: NOT DETECTED
N. gonorrhoeae RNA, TMA: NOT DETECTED

## 2018-09-12 ENCOUNTER — Other Ambulatory Visit: Payer: Self-pay | Admitting: Gynecology

## 2018-09-12 LAB — PAP IG W/ RFLX HPV ASCU

## 2018-09-12 MED ORDER — CLINDAMYCIN PHOSPHATE 2 % VA CREA: 1.0000 | TOPICAL_CREAM | Freq: Every day | VAGINAL | 0 refills | Status: AC

## 2018-09-26 ENCOUNTER — Other Ambulatory Visit: Payer: Self-pay | Admitting: Internal Medicine

## 2018-09-26 NOTE — Telephone Encounter (Signed)
Best number [phone] PT called to see if you can prescribe drysol  [facility]  [address]

## 2018-09-30 ENCOUNTER — Telehealth: Payer: Self-pay | Admitting: *Deleted

## 2018-09-30 MED ORDER — FLUCONAZOLE 150 MG PO TABS: 150.0000 mg | ORAL_TABLET | Freq: Every day | ORAL | 0 refills | Status: DC

## 2018-09-30 NOTE — Telephone Encounter (Signed)
Patient aware, Rx sent.  

## 2018-09-30 NOTE — Telephone Encounter (Signed)
patient was prescribed Cleocin vaginal cream nightly x7 nights on 09/12/18 ( result note) did not pick medication up/start right away, has 1 day left on Rx, called today c/o lots of vaginal itching, she asked if Rx can be sent in for itching? Please advise

## 2018-09-30 NOTE — Telephone Encounter (Signed)
Pt called to check status of request. She is aware Rollene Fare was out of office 6/5.

## 2018-09-30 NOTE — Telephone Encounter (Signed)
Okay for Diflucan 150 mg x 1 dose 

## 2018-09-30 NOTE — Telephone Encounter (Signed)
Drysol has been taken off the market.  There are other oral medications available but that will make your constipation worse.  If this is something you would like to consider, please schedule a virtual visit to discuss.

## 2018-10-01 MED ORDER — ALUMINUM CHLORIDE 20 % EX SOLN: Freq: Every day | CUTANEOUS | 0 refills | Status: DC

## 2018-10-01 NOTE — Telephone Encounter (Signed)
Pt stated the pharmacy told her that they would be able to fill the Drysol... pt has schedule her CPE... please advise

## 2018-10-01 NOTE — Addendum Note (Signed)
Addended by: Lurlean Nanny on: 10/01/2018 03:08 PM   Modules accepted: Orders

## 2018-12-12 ENCOUNTER — Ambulatory Visit (INDEPENDENT_AMBULATORY_CARE_PROVIDER_SITE_OTHER): Payer: 59 | Admitting: Internal Medicine

## 2018-12-12 ENCOUNTER — Other Ambulatory Visit: Payer: Self-pay

## 2018-12-12 ENCOUNTER — Encounter: Payer: Self-pay | Admitting: Internal Medicine

## 2018-12-12 VITALS — BP 116/78 | HR 70 | Temp 98.2°F | Ht 64.0 in | Wt 158.0 lb

## 2018-12-12 DIAGNOSIS — Z0000 Encounter for general adult medical examination without abnormal findings: Secondary | ICD-10-CM

## 2018-12-12 DIAGNOSIS — R61 Generalized hyperhidrosis: Secondary | ICD-10-CM

## 2018-12-12 DIAGNOSIS — Z131 Encounter for screening for diabetes mellitus: Secondary | ICD-10-CM | POA: Diagnosis not present

## 2018-12-12 DIAGNOSIS — N898 Other specified noninflammatory disorders of vagina: Secondary | ICD-10-CM | POA: Diagnosis not present

## 2018-12-12 DIAGNOSIS — K581 Irritable bowel syndrome with constipation: Secondary | ICD-10-CM

## 2018-12-12 LAB — POCT GLYCOSYLATED HEMOGLOBIN (HGB A1C): Hemoglobin A1C: 5.8 % — AB (ref 4.0–5.6)

## 2018-12-12 NOTE — Addendum Note (Signed)
Addended by: Lurlean Nanny on: 12/12/2018 10:07 AM   Modules accepted: Orders

## 2018-12-12 NOTE — Assessment & Plan Note (Signed)
Continue Drysol Will monitor

## 2018-12-12 NOTE — Progress Notes (Signed)
Subjective:    Patient ID: [name], female    DOB: 20-Jul-1985, 33 y.o.   MRN: [id number]  HPI  Pt presents to the clinic today for her annual exam. She is also due to follow up chronic conditions.  Excessive Sweating: Controlled with Drysol.  IBS: Mainly constipation. Managed with diet and Benefiber. She does not follow with GI.  Flu: never Tetanus: 11/2012 Pap Smear: 08/2018 Dentist: as needed  Diet: She does eat meat. She consumes fruits and veggies daily. She does not eat fried foods. She drinks mostly water. Exercise: abs  Review of Systems  Past Medical History:  Diagnosis Date  . ASCUS of cervix with negative high risk HPV 05/2016  . Chicken pox   . Elevated prolactin level (HCC)    30 range 12/2008, normal X multiple repeats, most recent 18 04/2011  . Fibroadenoma of breast    LEFT  . IBS (irritable bowel syndrome)   . LGSIL (low grade squamous intraepithelial dysplasia) 12/2008, 02/2011   C&B WITH LGSIL 02/2011  . MRSA (methicillin resistant staph aureus) culture positive 04/2014   vulvar abscess  . STD (sexually transmitted disease)    History of Chlamydia    Current Outpatient Medications  Medication Sig Dispense Refill  . aluminum chloride (DRYSOL) 20 % external solution Apply topically at bedtime. 35 mL 0  . etonogestrel (NEXPLANON) 68 MG IMPL implant 1 each by Subdermal route once.     No current facility-administered medications for this visit.     Allergies  Allergen Reactions  . Banana Itching    Family History  Problem Relation Age of Onset  . Other Brother        TB  . Aneurysm Maternal Grandfather   . Cancer Maternal Grandmother        Lymphoma  . Alcohol abuse Neg Hx   . Arthritis Neg Hx   . Asthma Neg Hx   . Birth defects Neg Hx   . COPD Neg Hx   . Depression Neg Hx   . Diabetes Neg Hx   . Drug abuse Neg Hx   . Early death Neg Hx   . Hearing loss Neg Hx   . Heart disease Neg Hx   . Hyperlipidemia Neg Hx   .  Hypertension Neg Hx   . Kidney disease Neg Hx   . Learning disabilities Neg Hx   . Mental illness Neg Hx   . Mental retardation Neg Hx   . Miscarriages / Stillbirths Neg Hx   . Vision loss Neg Hx   . Varicose Veins Neg Hx     Social History   Socioeconomic History  . Marital status: Single    Spouse name: Not on file  . Number of children: 1  . Years of education: Not on file  . Highest education level: Not on file  Occupational History  . 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 Financial resource strain: Not on file  . Food insecurity    Worry: Not on file    Inability: Not on file  . Transportation needs    Medical: Not on file    Non-medical: Not on file  Tobacco Use  . Smoking status: Former Smoker    Types: Cigarettes    Quit date: 04/12/2010    Years since quitting: 8.6  . Smokeless tobacco: Never Used  . Tobacco comment: Maybe 1 cigarette every 2 days  Substance and Sexual Activity  .  Alcohol use: Yes    Alcohol/week: 0.0 standard drinks    Comment: Occasional  . Drug use: No  . Sexual activity: Yes    Partners: Male    Birth control/protection: Implant    Comment: 1st intercourse 33 yo-More than 5 partners Nexplanon 08/08/2016  Lifestyle  . Physical activity    Days per week: Not on file    Minutes per session: Not on file  . Stress: Not on file  Relationships  . Social Herbalist on phone: Not on file    Gets together: Not on file    Attends religious service: Not on file    Active member of club or organization: Not on file    Attends meetings of clubs or organizations: Not on file    Relationship status: Not on file  . Intimate partner violence    Fear of current or ex partner: Not on file    Emotionally abused: Not on file    Physically abused: Not on file    Forced sexual activity: Not on file  Other Topics Concern  . Not on file  Social History Narrative  . Not on file     Constitutional:  Denies fever, malaise, fatigue, headache or abrupt weight changes.  HEENT: Denies eye pain, eye redness, ear pain, ringing in the ears, wax buildup, runny nose, nasal congestion, bloody nose, or sore throat. Respiratory: Denies difficulty breathing, shortness of breath, cough or sputum production.   Cardiovascular: Denies chest pain, chest tightness, palpitations or swelling in the hands or feet.  Gastrointestinal: Pt reports intermittent constipation. Denies abdominal pain, bloating, constipation, diarrhea or blood in the stool.  GU: Pt reports vaginal odor. Denies urgency, frequency, pain with urination, burning sensation, blood in urine, or discharge. Musculoskeletal: Denies decrease in range of motion, difficulty with gait, muscle pain or joint pain and swelling.  Skin: Pt reports excessive sweating. Denies redness, rashes, lesions or ulcercations.  Neurological: Denies dizziness, difficulty with memory, difficulty with speech or problems with balance and coordination.  Psych: Denies anxiety, depression, SI/HI.  No other specific complaints in a complete review of systems (except as listed in HPI above).     Objective:   Physical Exam   BP 116/78   Pulse 70   Temp 98.2 F (36.8 C) (Temporal)   Ht 5\' 4"  (1.626 m)   Wt 158 lb (71.7 kg)   BMI 27.12 kg/m  Wt Readings from Last 3 Encounters:  12/12/18 158 lb (71.7 kg)  09/10/18 155 lb (70.3 kg)  03/01/18 159 lb (72.1 kg)    General: Appears her stated age, well developed, well nourished in NAD. Skin: Warm, dry and intact. No rashes noted. HEENT: Head: normal shape and size; Eyes: sclera white, no icterus, conjunctiva pink, PERRLA and EOMs intact; Ears: Tm's gray and intact, normal light reflex;  Neck:  Neck supple, trachea midline. No masses, lumps or thyromegaly present.  Cardiovascular: Normal rate and rhythm. S1,S2 noted.  No murmur, rubs or gallops noted. No JVD or BLE edema.  Pulmonary/Chest: Normal effort and positive  vesicular breath sounds. No respiratory distress. No wheezes, rales or ronchi noted.  Abdomen: Soft and nontender. Normal bowel sounds. No distention or masses noted. Liver, spleen and kidneys non palpable. Musculoskeletal: Strength 5/5 BUE/BLE. No difficulty with gait.  Neurological: Alert and oriented. Cranial nerves II-XII grossly intact. Coordination normal.  Psychiatric: Mood and affect normal. Behavior is normal. Judgment and thought content normal.     BMET  Component Value Date/Time   NA 140 09/10/2018 0907   K 4.1 09/10/2018 0907   CL 105 09/10/2018 0907   CO2 27 09/10/2018 0907   GLUCOSE 94 09/10/2018 0907   BUN 8 09/10/2018 0907   CREATININE 0.86 09/10/2018 0907   CALCIUM 9.0 09/10/2018 0907   GFRNONAA >60 03/24/2015 1113   GFRAA >60 03/24/2015 1113    Lipid Panel     Component Value Date/Time   CHOL 185 09/10/2018 0907   TRIG 103 09/10/2018 0907   HDL 48 (L) 09/10/2018 0907   CHOLHDL 3.9 09/10/2018 0907   VLDL 16 06/01/2016 1636   LDLCALC 116 (H) 09/10/2018 0907    CBC    Component Value Date/Time   WBC 7.5 09/10/2018 0907   RBC 4.80 09/10/2018 0907   HGB 13.1 09/10/2018 0907   HCT 41.1 09/10/2018 0907   PLT 299 09/10/2018 0907   MCV 85.6 09/10/2018 0907   MCH 27.3 09/10/2018 0907   MCHC 31.9 (L) 09/10/2018 0907   RDW 13.1 09/10/2018 0907   LYMPHSABS 2,873 09/10/2018 0907   MONOABS 568 06/01/2016 1636   EOSABS 240 09/10/2018 0907   BASOSABS 38 09/10/2018 0907    Hgb A1C Lab Results  Component Value Date   HGBA1C 5.8 01/11/2015           Assessment & Plan:   Preventative Health Maintenance:  Encouraged her to get a flu shot in the fall Tetanus UTD Pap smear UTD Encouraged her to consume a balanced diet and exercise regimen Advised her to see a dentist annually Labs from 08/2018 reviewed- no additional labs indicated  Vaginal Odor:  Recent BV infection treated with Flagyl Will have her do self swab wet prep and will call her with  the results  Screen for DM 2:  POCT A1C today  RTC in 1 year, sooner if needed Webb Silversmith, NP

## 2018-12-12 NOTE — Assessment & Plan Note (Signed)
Continue high fiber diet, benefiber Encouraged adequate water intake Will monitor

## 2018-12-12 NOTE — Patient Instructions (Signed)
Health Maintenance, Female Adopting a healthy lifestyle and getting preventive care are important in promoting health and wellness. Ask your health care provider about:  The right schedule for you to have regular tests and exams.  Things you can do on your own to prevent diseases and keep yourself healthy. What should I know about diet, weight, and exercise? Eat a healthy diet   Eat a diet that includes plenty of vegetables, fruits, low-fat dairy products, and lean protein.  Do not eat a lot of foods that are high in solid fats, added sugars, or sodium. Maintain a healthy weight Body mass index (BMI) is used to identify weight problems. It estimates body fat based on height and weight. Your health care provider can help determine your BMI and help you achieve or maintain a healthy weight. Get regular exercise Get regular exercise. This is one of the most important things you can do for your health. Most adults should:  Exercise for at least 150 minutes each week. The exercise should increase your heart rate and make you sweat (moderate-intensity exercise).  Do strengthening exercises at least twice a week. This is in addition to the moderate-intensity exercise.  Spend less time sitting. Even light physical activity can be beneficial. Watch cholesterol and blood lipids Have your blood tested for lipids and cholesterol at 31 years of age, then have this test every 5 years. Have your cholesterol levels checked more often if:  Your lipid or cholesterol levels are high.  You are older than 31 years of age.  You are at high risk for heart disease. What should I know about cancer screening? Depending on your health history and family history, you may need to have cancer screening at various ages. This may include screening for:  Breast cancer.  Cervical cancer.  Colorectal cancer.  Skin cancer.  Lung cancer. What should I know about heart disease, diabetes, and high blood  pressure? Blood pressure and heart disease  High blood pressure causes heart disease and increases the risk of stroke. This is more likely to develop in people who have high blood pressure readings, are of African descent, or are overweight.  Have your blood pressure checked: ? Every 3-5 years if you are 18-39 years of age. ? Every year if you are 40 years old or older. Diabetes Have regular diabetes screenings. This checks your fasting blood sugar level. Have the screening done:  Once every three years after age 40 if you are at a normal weight and have a low risk for diabetes.  More often and at a younger age if you are overweight or have a high risk for diabetes. What should I know about preventing infection? Hepatitis B If you have a higher risk for hepatitis B, you should be screened for this virus. Talk with your health care provider to find out if you are at risk for hepatitis B infection. Hepatitis C Testing is recommended for:  Everyone born from 1945 through 1965.  Anyone with known risk factors for hepatitis C. Sexually transmitted infections (STIs)  Get screened for STIs, including gonorrhea and chlamydia, if: ? You are sexually active and are younger than 31 years of age. ? You are older than 31 years of age and your health care provider tells you that you are at risk for this type of infection. ? Your sexual activity has changed since you were last screened, and you are at increased risk for chlamydia or gonorrhea. Ask your health care provider if   you are at risk.  Ask your health care provider about whether you are at high risk for HIV. Your health care provider may recommend a prescription medicine to help prevent HIV infection. If you choose to take medicine to prevent HIV, you should first get tested for HIV. You should then be tested every 3 months for as long as you are taking the medicine. Pregnancy  If you are about to stop having your period (premenopausal) and  you may become pregnant, seek counseling before you get pregnant.  Take 400 to 800 micrograms (mcg) of folic acid every day if you become pregnant.  Ask for birth control (contraception) if you want to prevent pregnancy. Osteoporosis and menopause Osteoporosis is a disease in which the bones lose minerals and strength with aging. This can result in bone fractures. If you are 65 years old or older, or if you are at risk for osteoporosis and fractures, ask your health care provider if you should:  Be screened for bone loss.  Take a calcium or vitamin D supplement to lower your risk of fractures.  Be given hormone replacement therapy (HRT) to treat symptoms of menopause. Follow these instructions at home: Lifestyle  Do not use any products that contain nicotine or tobacco, such as cigarettes, e-cigarettes, and chewing tobacco. If you need help quitting, ask your health care provider.  Do not use street drugs.  Do not share needles.  Ask your health care provider for help if you need support or information about quitting drugs. Alcohol use  Do not drink alcohol if: ? Your health care provider tells you not to drink. ? You are pregnant, may be pregnant, or are planning to become pregnant.  If you drink alcohol: ? Limit how much you use to 0-1 drink a day. ? Limit intake if you are breastfeeding.  Be aware of how much alcohol is in your drink. In the [place] one drink equals one 12 oz bottle of beer (355 mL), one 5 oz glass of wine (148 mL), or one 1 oz glass of hard liquor (44 mL). General instructions  Schedule regular health, dental, and eye exams.  Stay current with your vaccines.  Tell your health care provider if: ? You often feel depressed. ? You have ever been abused or do not feel safe at home. Summary  Adopting a healthy lifestyle and getting preventive care are important in promoting health and wellness.  Follow your health care provider's instructions about healthy  diet, exercising, and getting tested or screened for diseases.  Follow your health care provider's instructions on monitoring your cholesterol and blood pressure. This information is not intended to replace advice given to you by your health care provider. Make sure you discuss any questions you have with your health care provider. Document Released: 10/24/2010 Document Revised: 04/03/2018 Document Reviewed: 04/03/2018 Elsevier Patient Education  2020 [employer]  

## 2018-12-13 ENCOUNTER — Other Ambulatory Visit: Payer: Self-pay | Admitting: Internal Medicine

## 2018-12-13 LAB — WET PREP BY MOLECULAR PROBE
Candida species: DETECTED — AB
MICRO NUMBER:: 793412
SPECIMEN QUALITY:: ADEQUATE
Trichomonas vaginosis: NOT DETECTED

## 2018-12-13 MED ORDER — METRONIDAZOLE 500 MG PO TABS: 500.0000 mg | ORAL_TABLET | Freq: Two times a day (BID) | ORAL | 0 refills | Status: DC

## 2018-12-31 ENCOUNTER — Telehealth: Payer: Self-pay | Admitting: *Deleted

## 2018-12-31 MED ORDER — METRONIDAZOLE 500 MG PO TABS: 500.0000 mg | ORAL_TABLET | Freq: Two times a day (BID) | ORAL | 0 refills | Status: DC

## 2018-12-31 NOTE — Telephone Encounter (Signed)
I spoke to pt and she states that she was not taking Rx and instructed she missed doses and now she has lost the bottle with around 3 days worth of medication. Pt would like to know if she could get a refill so that she take and make sure she takes BID for 7 days... please advise

## 2018-12-31 NOTE — Telephone Encounter (Signed)
Refilled

## 2018-12-31 NOTE — Telephone Encounter (Signed)
Patient left a voicemail stating that she got the results of her having BV and has questions about the results. Patient requested a call back regarding the results.

## 2018-12-31 NOTE — Addendum Note (Signed)
Addended by: Jearld Fenton on: 12/31/2018 02:02 PM   Modules accepted: Orders

## 2019-01-13 ENCOUNTER — Encounter: Payer: Self-pay | Admitting: Gynecology

## 2019-01-27 ENCOUNTER — Ambulatory Visit (INDEPENDENT_AMBULATORY_CARE_PROVIDER_SITE_OTHER): Payer: 59 | Admitting: Internal Medicine

## 2019-01-27 ENCOUNTER — Encounter: Payer: Self-pay | Admitting: Internal Medicine

## 2019-01-27 ENCOUNTER — Other Ambulatory Visit: Payer: Self-pay

## 2019-01-27 VITALS — BP 114/72 | HR 98 | Temp 98.6°F | Wt 161.0 lb

## 2019-01-27 DIAGNOSIS — Z113 Encounter for screening for infections with a predominantly sexual mode of transmission: Secondary | ICD-10-CM | POA: Diagnosis not present

## 2019-01-27 NOTE — Progress Notes (Signed)
Subjective:    Patient ID: [name], female    DOB: 1985/11/28, 33 y.o.   MRN: [id number]  HPI  Pt presents to the clinic today for STD screening. She would like testing for gonorrhea, chlamydia and trichomonas. She also wants HSV, HIV, RPR. She has a new sexual partner and she did not use protection. She has intermittent pelvic pain but denies vaginal irritation, itching, discharge, odor or abnormal bleeding. She has a Nexplanon and reports irregular menses with this.   Review of Systems      Past Medical History:  Diagnosis Date  . ASCUS of cervix with negative high risk HPV 05/2016  . Chicken pox   . Elevated prolactin level (HCC)    30 range 12/2008, normal X multiple repeats, most recent 18 04/2011  . Fibroadenoma of breast    LEFT  . IBS (irritable bowel syndrome)   . LGSIL (low grade squamous intraepithelial dysplasia) 12/2008, 02/2011   C&B WITH LGSIL 02/2011  . MRSA (methicillin resistant staph aureus) culture positive 04/2014   vulvar abscess  . STD (sexually transmitted disease)    History of Chlamydia    Current Outpatient Medications  Medication Sig Dispense Refill  . aluminum chloride (DRYSOL) 20 % external solution Apply topically at bedtime. 35 mL 0  . etonogestrel (NEXPLANON) 68 MG IMPL implant 1 each by Subdermal route once.    . metroNIDAZOLE (FLAGYL) 500 MG tablet Take 1 tablet (500 mg total) by mouth 2 (two) times daily. 14 tablet 0   No current facility-administered medications for this visit.     Allergies  Allergen Reactions  . Banana Itching    Family History  Problem Relation Age of Onset  . Other Brother        TB  . Aneurysm Maternal Grandfather   . Cancer Maternal Grandmother        Lymphoma  . Alcohol abuse Neg Hx   . Arthritis Neg Hx   . Asthma Neg Hx   . Birth defects Neg Hx   . COPD Neg Hx   . Depression Neg Hx   . Diabetes Neg Hx   . Drug abuse Neg Hx   . Early death Neg Hx   . Hearing loss Neg Hx   . Heart  disease Neg Hx   . Hyperlipidemia Neg Hx   . Hypertension Neg Hx   . Kidney disease Neg Hx   . Learning disabilities Neg Hx   . Mental illness Neg Hx   . Mental retardation Neg Hx   . Miscarriages / Stillbirths Neg Hx   . Vision loss Neg Hx   . Varicose Veins Neg Hx     Social History   Socioeconomic History  . Marital status: Single    Spouse name: Not on file  . Number of children: 1  . Years of education: Not on file  . Highest education level: Not on file  Occupational History  . 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 Financial resource strain: Not on file  . Food insecurity    Worry: Not on file    Inability: Not on file  . Transportation needs    Medical: Not on file    Non-medical: Not on file  Tobacco Use  . Smoking status: Former Smoker    Types: Cigarettes    Quit date: 04/12/2010    Years since quitting: 8.8  . Smokeless tobacco: Never Used  . Tobacco  comment: Maybe 1 cigarette every 2 days  Substance and Sexual Activity  . Alcohol use: Yes    Alcohol/week: 0.0 standard drinks    Comment: Occasional  . Drug use: No  . Sexual activity: Yes    Partners: Male    Birth control/protection: Implant    Comment: 1st intercourse 33 yo-More than 5 partners Nexplanon 08/08/2016  Lifestyle  . Physical activity    Days per week: Not on file    Minutes per session: Not on file  . Stress: Not on file  Relationships  . Social Herbalist on phone: Not on file    Gets together: Not on file    Attends religious service: Not on file    Active member of club or organization: Not on file    Attends meetings of clubs or organizations: Not on file    Relationship status: Not on file  . Intimate partner violence    Fear of current or ex partner: Not on file    Emotionally abused: Not on file    Physically abused: Not on file    Forced sexual activity: Not on file  Other Topics Concern  . Not on file  Social History  Narrative  . Not on file     Constitutional: Denies fever, malaise, fatigue, headache or abrupt weight changes.  Respiratory: Denies difficulty breathing, shortness of breath, cough or sputum production.   Cardiovascular: Denies chest pain, chest tightness, palpitations or swelling in the hands or feet.  Gastrointestinal: Pt reports intermittent pelvic pain. Denies bloating, constipation, diarrhea or blood in the stool.  GU: Denies urgency, frequency, pain with urination, burning sensation, blood in urine, odor or discharge. Skin: Denies redness, rashes, lesions or ulcercations.   No other specific complaints in a complete review of systems (except as listed in HPI above).  Objective:   Physical Exam  BP 114/72   Pulse 98   Temp 98.6 F (37 C) (Temporal)   Wt 161 lb (73 kg)   SpO2 99%   BMI 27.64 kg/m  Wt Readings from Last 3 Encounters:  01/27/19 161 lb (73 kg)  12/12/18 158 lb (71.7 kg)  09/10/18 155 lb (70.3 kg)    General: Appears her stated age, well developed, well nourished in NAD. Cardiovascular: Normal rate and rhythm. S1,S2 noted.  No murmur, rubs or gallops noted. Pulmonary/Chest: Normal effort and positive vesicular breath sounds. No respiratory distress. No wheezes, rales or ronchi noted.  Abdomen: Soft and nontender. Normal bowel sounds. No distention or masses noted.  Pelvic: self swabbed. Neurological: Alert and oriented.    BMET    Component Value Date/Time   NA 140 09/10/2018 0907   K 4.1 09/10/2018 0907   CL 105 09/10/2018 0907   CO2 27 09/10/2018 0907   GLUCOSE 94 09/10/2018 0907   BUN 8 09/10/2018 0907   CREATININE 0.86 09/10/2018 0907   CALCIUM 9.0 09/10/2018 0907   GFRNONAA >60 03/24/2015 1113   GFRAA >60 03/24/2015 1113    Lipid Panel     Component Value Date/Time   CHOL 185 09/10/2018 0907   TRIG 103 09/10/2018 0907   HDL 48 (L) 09/10/2018 0907   CHOLHDL 3.9 09/10/2018 0907   VLDL 16 06/01/2016 1636   LDLCALC 116 (H) 09/10/2018  0907    CBC    Component Value Date/Time   WBC 7.5 09/10/2018 0907   RBC 4.80 09/10/2018 0907   HGB 13.1 09/10/2018 0907   HCT 41.1 09/10/2018 0907  PLT 299 09/10/2018 0907   MCV 85.6 09/10/2018 0907   MCH 27.3 09/10/2018 0907   MCHC 31.9 (L) 09/10/2018 0907   RDW 13.1 09/10/2018 0907   LYMPHSABS 2,873 09/10/2018 0907   MONOABS 568 06/01/2016 1636   EOSABS 240 09/10/2018 0907   BASOSABS 38 09/10/2018 0907    Hgb A1C Lab Results  Component Value Date   HGBA1C 5.8 (A) 12/12/2018            Assessment & Plan:   Screen for STD:  Will check urine gonorrhea and chlamydia Will check wet prep for yeast, trich and BV Will check HIV, RPR and HSV  Will follow up after labs, return precautions discussed Webb Silversmith, NP

## 2019-01-27 NOTE — Addendum Note (Signed)
Addended by: Ellamae Sia on: 01/27/2019 04:05 PM   Modules accepted: Orders

## 2019-01-27 NOTE — Addendum Note (Signed)
Addended by: Ellamae Sia on: 01/27/2019 03:23 PM   Modules accepted: Orders

## 2019-01-27 NOTE — Patient Instructions (Signed)

## 2019-01-28 ENCOUNTER — Ambulatory Visit: Payer: 59 | Admitting: Gynecology

## 2019-01-28 LAB — WET PREP BY MOLECULAR PROBE
Candida species: NOT DETECTED
Gardnerella vaginalis: NOT DETECTED
MICRO NUMBER:: 954512
SPECIMEN QUALITY:: ADEQUATE
Trichomonas vaginosis: NOT DETECTED

## 2019-01-30 ENCOUNTER — Telehealth: Payer: Self-pay | Admitting: Internal Medicine

## 2019-01-30 LAB — WET PREP BY MOLECULAR PROBE

## 2019-01-30 LAB — RPR: RPR Ser Ql: NONREACTIVE

## 2019-01-30 LAB — C. TRACHOMATIS/N. GONORRHOEAE RNA
C. trachomatis RNA, TMA: NOT DETECTED
N. gonorrhoeae RNA, TMA: NOT DETECTED

## 2019-01-30 LAB — HSV(HERPES SIMPLEX VRS) I + II AB-IGG
HAV 1 IGG,TYPE SPECIFIC AB: 45.7 index — ABNORMAL HIGH
HSV 2 IGG,TYPE SPECIFIC AB: <0.9 index

## 2019-01-30 LAB — HIV ANTIBODY (ROUTINE TESTING W REFLEX): HIV 1&2 Ab, 4th Generation: NONREACTIVE

## 2019-01-30 LAB — HSV 1/2 AB (IGM), IFA W/RFLX TITER
HSV 1 IgM Screen: NEGATIVE
HSV 2 IgM Screen: NEGATIVE

## 2019-01-30 NOTE — Telephone Encounter (Signed)
Patient called to see if her lab results have come back. She did not see anything on her my chart and wanted to check   Patient is requesting a call back

## 2019-01-31 MED ORDER — FLUCONAZOLE 150 MG PO TABS: 150.0000 mg | ORAL_TABLET | Freq: Once | ORAL | 0 refills | Status: AC

## 2019-01-31 MED ORDER — METRONIDAZOLE 0.75 % VA GEL: 1.0000 | Freq: Two times a day (BID) | VAGINAL | 0 refills | Status: DC

## 2019-01-31 NOTE — Addendum Note (Signed)
Addended by: Jearld Fenton on: 01/31/2019 04:29 PM   Modules accepted: Orders

## 2019-03-12 ENCOUNTER — Other Ambulatory Visit: Payer: Self-pay

## 2019-03-12 ENCOUNTER — Telehealth: Payer: Self-pay | Admitting: Internal Medicine

## 2019-03-12 DIAGNOSIS — Z20822 Contact with and (suspected) exposure to covid-19: Secondary | ICD-10-CM

## 2019-03-12 NOTE — Telephone Encounter (Signed)
Pt had test done this afternoon

## 2019-03-12 NOTE — Telephone Encounter (Signed)
I would recommend she go to one of the testing sites 3-5 days after exposure for testing.

## 2019-03-12 NOTE — Telephone Encounter (Signed)
Pt called stating she was exposed to a person with covid over the weekend  They received there results today. Should she be tested if so when??  Pt is not having any symptoms then she stated she is having a sore throat this started before being exposed and pt stated her son is coughing and she is going to call his dr  Pt is aware to quarantine until she hears from the office

## 2019-03-14 LAB — NOVEL CORONAVIRUS, NAA: SARS-CoV-2, NAA: NOT DETECTED

## 2019-06-30 ENCOUNTER — Telehealth: Payer: Self-pay

## 2019-06-30 NOTE — Telephone Encounter (Signed)
Noted, will have her follow up with GYN

## 2019-06-30 NOTE — Telephone Encounter (Signed)
[facility] Night - Client TELEPHONE ADVICE RECORD [facility] Patient Name: [name] Gender: Female DOB: October 10, 1985 Age: 34 Y 11 M 8 D Return Phone Number: [phone] (Primary) Address: City/State/Zip: [place] [address] Client [facility] - Client Client Site [facility] Physician Webb Silversmith - NP Contact Type Call Who Is Calling Patient / Member / Family / Caregiver Call Type Triage / Clinical Relationship To Patient Self Return Phone Number [phone] (Primary) Chief Complaint Health information question (non symptomatic) Reason for Call Symptomatic / Request for [place] states that friday night she had a new sex partner, and it was protected until the end when he removed the condom and then she noticed that he ejaculated on her and she is unsure if any went in her Translation No Nurse Assessment Nurse: Solocinski, RN, Beth Date/Time ([place] Time): 06/29/2019 9:01:14 PM Confirm and document reason for call. If symptomatic, describe symptoms. ---Caller states she had sex Friday night and partner removed condom prior ejacalated and may have inside her. Caller states she is taking birth control. Caller states she has an annual appointment this coming Thursday. Caller is concerned about std's. Has the patient had close contact with a person known or suspected to have the novel coronavirus illness OR traveled / lives in area with major community spread (including international travel) in the last 14 days from the onset of symptoms? * If Asymptomatic, screen for exposure and travel within the last 14 days. ---No Does the patient have any new or worsening symptoms? ---Yes Will a triage be completed? ---Yes Related visit to physician within the last 2 weeks? ---No Does the PT have any chronic conditions? (i.e. diabetes, asthma, this includes High  risk factors for pregnancy, etc.) ---No Is the patient pregnant or possibly pregnant? (Ask all females between the ages of 44-55) ---No Is this a behavioral health or substance abuse call? ---No PLEASE NOTE: All timestamps contained within this report are represented as [place] Standard Time. CONFIDENTIALTY NOTICE: This fax transmission is intended only for the addressee. It contains information that is legally privileged, confidential or otherwise protected from use or disclosure. If you are not the intended recipient, you are strictly prohibited from reviewing, disclosing, copying using or disseminating any of this information or taking any action in reliance on or regarding this information. If you have received this fax in error, please notify us immediately by telephone so that we can arrange for its return to [place]. Phone: [phone], Toll-Free: [phone], Fax: [phone] Page: 2 of 2 Call Id: [id number] Guidelines Guideline Title Affirmed Question Affirmed Notes Nurse Date/Time Eilene Ghazi Time) STD Questions Preventing STDs, questions about Solocinski, RN, [facility] 06/29/2019 9:09:29 PM Disp. Time Eilene Ghazi Time) Disposition Final User 06/29/2019 9:12:18 PM Home Care Yes Solocinski, RN, Beth Caller Disagree/Comply Comply Caller Understands Yes PreDisposition Call Doctor Care Advice Given Per Guideline HOME CARE: * You should be able to treat this at home. * Most STDs are transmitted by exchange of body fluids (semen, vaginal secretions or blood) during oral, anal, or vaginal sex. * Also can occur following direct contact with any sores/lesions during sex. * Abstaining from sexual intercourse (vaginal or anal) and from oral sex is the only 100% effective means of avoiding STDs. BEHAVIORS THAT DON'T PREVENT STDS: * Douching the vagina or showering after sex does not prevent STDs. * Withdrawal (when a man pulls his penis out before he ejaculates/'comes') is not a way  to prevent STDs or  pregnancy. * Having a STD once does not prevent you from getting it again. * Using other birth control methods, such as the birth control pill or Depo shots, doesn't prevent you from getting a STD. You still need to protect yourself with condoms. * Using sex toys or fingers does not prevent STDs if there is any shared body fluids from oral-anal contact. CALL BACK IF: * You are having any symptoms that you think might be a STD * You have sex without a condom or the condom breaks during sex (you might be able to take some emergency contraception pills to prevent pregnancy if you call within 72 hours). * Female patient misses her period and might be pregnant CARE ADVICE given per STD Questions (Adult) guideline.

## 2019-06-30 NOTE — Telephone Encounter (Signed)
Pt already has appt with GYN on 07/03/19 for CPX.

## 2019-07-02 ENCOUNTER — Other Ambulatory Visit: Payer: Self-pay

## 2019-07-03 ENCOUNTER — Ambulatory Visit (INDEPENDENT_AMBULATORY_CARE_PROVIDER_SITE_OTHER): Payer: 59 | Admitting: Obstetrics and Gynecology

## 2019-07-03 ENCOUNTER — Encounter: Payer: Self-pay | Admitting: Obstetrics and Gynecology

## 2019-07-03 VITALS — BP 116/74 | Ht 64.0 in | Wt 154.0 lb

## 2019-07-03 DIAGNOSIS — Z113 Encounter for screening for infections with a predominantly sexual mode of transmission: Secondary | ICD-10-CM | POA: Diagnosis not present

## 2019-07-03 DIAGNOSIS — N760 Acute vaginitis: Secondary | ICD-10-CM | POA: Diagnosis not present

## 2019-07-03 DIAGNOSIS — N898 Other specified noninflammatory disorders of vagina: Secondary | ICD-10-CM

## 2019-07-03 DIAGNOSIS — Z01419 Encounter for gynecological examination (general) (routine) without abnormal findings: Secondary | ICD-10-CM | POA: Diagnosis not present

## 2019-07-03 DIAGNOSIS — B9689 Other specified bacterial agents as the cause of diseases classified elsewhere: Secondary | ICD-10-CM

## 2019-07-03 DIAGNOSIS — Z13220 Encounter for screening for lipoid disorders: Secondary | ICD-10-CM

## 2019-07-03 LAB — WET PREP FOR TRICH, YEAST, CLUE

## 2019-07-03 MED ORDER — METRONIDAZOLE 0.75 % VA GEL: 1.0000 | Freq: Every day | VAGINAL | 0 refills | Status: AC

## 2019-07-03 NOTE — Patient Instructions (Signed)
Please make an appointment for Nexplanon removal and reinsertion when due for removal next month

## 2019-07-03 NOTE — Progress Notes (Signed)
[place] 10-Dec-1985 [id number]  SUBJECTIVE:  34 y.o. [id number] female for annual routine gynecologic exam. She does have a new sexual partner and requests STD screening.  She has been amenorrheic with the Nexplanon, which is due to the removed next month.  Current Outpatient Medications  Medication Sig Dispense Refill  . aluminum chloride (DRYSOL) 20 % external solution Apply topically at bedtime. 35 mL 0  . etonogestrel (NEXPLANON) 68 MG IMPL implant 1 each by Subdermal route once.     No current facility-administered medications for this visit.   Allergies: Banana and Flagyl [metronidazole]  No LMP recorded. Patient has had an implant.  Past medical history,surgical history, problem list, medications, allergies, family history and social history were all reviewed and documented as reviewed in the EPIC chart.  ROS:  Feeling well. No dyspnea or chest pain on exertion.  No abdominal pain, change in bowel habits, black or bloody stools.  No urinary tract symptoms. GYN ROS: no abnormal bleeding, pelvic pain or discharge, no breast pain or new or enlarging lumps on self exam. No neurological complaints.   OBJECTIVE:  BP 116/74   Ht 5\' 4"  (1.626 m)   Wt 154 lb (69.9 kg)   BMI 26.43 kg/m  The patient appears well, alert, oriented x 3, in no distress. ENT normal.  Neck supple. No cervical or supraclavicular adenopathy or thyromegaly.  Lungs are clear, good air entry, no wheezes, rhonchi or rales. S1 and S2 normal, no murmurs, regular rate and rhythm.  Abdomen soft without tenderness, guarding, mass or organomegaly.  Neurological is normal, no focal findings.  BREAST EXAM: breasts appear normal, no suspicious masses, no skin or nipple changes or axillary nodes  PELVIC EXAM: VULVA: normal appearing vulva with no masses, tenderness or lesions, VAGINA: normal appearing vagina with normal color and discharge, no lesions, CERVIX: normal appearing cervix without discharge or lesions,  UTERUS: uterus is normal size, shape, consistency and nontender, ADNEXA: normal adnexa in size, nontender and no masses Vaginal wet prep positive for clue cells, negative for hyphae and trichomonas, few WBC, many bacteria, 13-20 epithelial cells/hpf  Chaperone: Caryn Bee present during the examination  ASSESSMENT:  34 y.o. [id number] here for annual gynecologic exam  PLAN:   1. No menstrual or hormonal concerns.  2. Pap smear 08/2018 was normal.  ASCUS pap smear in 2018 with negative HPV.  Current screening guidelines calling for the 3-year Pap smear cytology only interval. 3. Contraception: Nexplanon placed March 2018, due for removal next month. Will make appointment and would like to have it replaced. 4. STD screening requested.  No specific exposure noted.  GC/chlamydia, HIV, RPR, hepatitis B, hepatitis C ordered.  She had +HSV-1 IgG last year so will not retest.  No concerns by symptoms or exam for genital herpes. 5. Normal breast exam.  Encouraged breast self awareness and notify us of any concerning changes. 6.  Bacterial vaginosis.  She is offered a course of MetroGel (she has had a reaction previously to oral metronidazole) or vaginal Cleocin and will try the MetroGel for 5 nights.  Aware that this is the same antibiotic so could cause reaction, but may not as it is different route of administration. Rx sent to pharmacy. Tips to reduce BV recurrence are discussed.  7. Health maintenance.  Routine screening blood work (lipids, CBC, CMP along with STD screen as above) is ordered.  Return annually for exam, 1 month for Nexplanon removal, or sooner, prn.  Joseph Pierini MD  07/03/19

## 2019-07-04 LAB — CBC
HCT: 44.6 % (ref 35.0–45.0)
Hemoglobin: 14.5 g/dL (ref 11.7–15.5)
MCH: 28.5 pg (ref 27.0–33.0)
MCHC: 32.5 g/dL (ref 32.0–36.0)
MCV: 87.8 fL (ref 80.0–100.0)
MPV: 9 fL (ref 7.5–12.5)
Platelets: 284 10*3/uL (ref 140–400)
RBC: 5.08 10*6/uL (ref 3.80–5.10)
RDW: 13.6 % (ref 11.0–15.0)
WBC: 5.3 10*3/uL (ref 3.8–10.8)

## 2019-07-04 LAB — COMPREHENSIVE METABOLIC PANEL
AG Ratio: 1.8 (calc) (ref 1.0–2.5)
ALT: 12 U/L (ref 6–29)
AST: 14 U/L (ref 10–30)
Albumin: 4.3 g/dL (ref 3.6–5.1)
Alkaline phosphatase (APISO): 55 U/L (ref 31–125)
BUN: 11 mg/dL (ref 7–25)
CO2: 27 mmol/L (ref 20–32)
Calcium: 9.4 mg/dL (ref 8.6–10.2)
Chloride: 106 mmol/L (ref 98–110)
Creat: 0.91 mg/dL (ref 0.50–1.10)
Globulin: 2.4 g/dL (calc) (ref 1.9–3.7)
Glucose, Bld: 99 mg/dL (ref 65–99)
Potassium: 4 mmol/L (ref 3.5–5.3)
Sodium: 141 mmol/L (ref 135–146)
Total Bilirubin: 0.5 mg/dL (ref 0.2–1.2)
Total Protein: 6.7 g/dL (ref 6.1–8.1)

## 2019-07-04 LAB — LIPID PANEL
Cholesterol: 169 mg/dL (ref <200)
HDL: 47 mg/dL — ABNORMAL LOW (ref >=50)
LDL Cholesterol (Calc): 106 mg/dL (calc) — ABNORMAL HIGH
Non-HDL Cholesterol (Calc): 122 mg/dL (calc) (ref <130)
Total CHOL/HDL Ratio: 3.6 (calc) (ref <5.0)
Triglycerides: 73 mg/dL (ref <150)

## 2019-07-04 LAB — HSV(HERPES SIMPLEX VRS) I + II AB-IGG
HSV 1 IGG,TYPE SPECIFIC AB: 44 {index} — ABNORMAL HIGH
HSV 2 IGG,TYPE SPECIFIC AB: <0.9 index

## 2019-07-04 LAB — HEPATITIS B SURFACE ANTIGEN: Hepatitis B Surface Ag: NONREACTIVE

## 2019-07-04 LAB — HEPATITIS C ANTIBODY
Hepatitis C Ab: NONREACTIVE
SIGNAL TO CUT-OFF: 0.04 (ref <1.00)

## 2019-07-04 LAB — C. TRACHOMATIS/N. GONORRHOEAE RNA
C. trachomatis RNA, TMA: NOT DETECTED
N. gonorrhoeae RNA, TMA: NOT DETECTED

## 2019-07-04 LAB — HIV ANTIBODY (ROUTINE TESTING W REFLEX): HIV 1&2 Ab, 4th Generation: NONREACTIVE

## 2019-07-12 ENCOUNTER — Other Ambulatory Visit: Payer: Self-pay

## 2019-07-12 ENCOUNTER — Emergency Department (HOSPITAL_COMMUNITY): Payer: 59

## 2019-07-12 ENCOUNTER — Emergency Department (HOSPITAL_COMMUNITY)
Admission: EM | Admit: 2019-07-12 | Discharge: 2019-07-12 | Disposition: A | Discharge status: Home / Self Care | Payer: 59 | Attending: Emergency Medicine | Admitting: Emergency Medicine

## 2019-07-12 ENCOUNTER — Encounter (HOSPITAL_COMMUNITY): Payer: Self-pay | Admitting: Emergency Medicine

## 2019-07-12 DIAGNOSIS — Z87891 Personal history of nicotine dependence: Secondary | ICD-10-CM | POA: Insufficient documentation

## 2019-07-12 DIAGNOSIS — S0993XA Unspecified injury of face, initial encounter: Secondary | ICD-10-CM | POA: Diagnosis present

## 2019-07-12 DIAGNOSIS — S01511A Laceration without foreign body of lip, initial encounter: Secondary | ICD-10-CM | POA: Diagnosis not present

## 2019-07-12 DIAGNOSIS — Y939 Activity, unspecified: Secondary | ICD-10-CM | POA: Insufficient documentation

## 2019-07-12 DIAGNOSIS — S61431A Puncture wound without foreign body of right hand, initial encounter: Secondary | ICD-10-CM | POA: Insufficient documentation

## 2019-07-12 DIAGNOSIS — S0181XA Laceration without foreign body of other part of head, initial encounter: Secondary | ICD-10-CM

## 2019-07-12 DIAGNOSIS — Y92410 Unspecified street and highway as the place of occurrence of the external cause: Secondary | ICD-10-CM | POA: Insufficient documentation

## 2019-07-12 DIAGNOSIS — Y999 Unspecified external cause status: Secondary | ICD-10-CM | POA: Diagnosis not present

## 2019-07-12 DIAGNOSIS — S01412A Laceration without foreign body of left cheek and temporomandibular area, initial encounter: Secondary | ICD-10-CM | POA: Insufficient documentation

## 2019-07-12 DIAGNOSIS — Z79899 Other long term (current) drug therapy: Secondary | ICD-10-CM | POA: Diagnosis not present

## 2019-07-12 DIAGNOSIS — T148XXA Other injury of unspecified body region, initial encounter: Secondary | ICD-10-CM

## 2019-07-12 DIAGNOSIS — S5011XA Contusion of right forearm, initial encounter: Secondary | ICD-10-CM | POA: Diagnosis not present

## 2019-07-12 DIAGNOSIS — S01512A Laceration without foreign body of oral cavity, initial encounter: Secondary | ICD-10-CM

## 2019-07-12 LAB — CBC WITH DIFFERENTIAL/PLATELET
Abs Immature Granulocytes: 0.02 10*3/uL (ref 0.00–0.07)
Basophils Absolute: 0 10*3/uL (ref 0.0–0.1)
Basophils Relative: 0 %
Eosinophils Absolute: 0.1 10*3/uL (ref 0.0–0.5)
Eosinophils Relative: 2 %
HCT: 48.2 % — ABNORMAL HIGH (ref 36.0–46.0)
Hemoglobin: 15.1 g/dL — ABNORMAL HIGH (ref 12.0–15.0)
Immature Granulocytes: 0 %
Lymphocytes Relative: 44 %
Lymphs Abs: 3 10*3/uL (ref 0.7–4.0)
MCH: 28.3 pg (ref 26.0–34.0)
MCHC: 31.3 g/dL (ref 30.0–36.0)
MCV: 90.3 fL (ref 80.0–100.0)
Monocytes Absolute: 0.6 10*3/uL (ref 0.1–1.0)
Monocytes Relative: 8 %
Neutro Abs: 3.2 10*3/uL (ref 1.7–7.7)
Neutrophils Relative %: 46 %
Platelets: 280 10*3/uL (ref 150–400)
RBC: 5.34 MIL/uL — ABNORMAL HIGH (ref 3.87–5.11)
RDW: 16 % — ABNORMAL HIGH (ref 11.5–15.5)
WBC: 7 10*3/uL (ref 4.0–10.5)
nRBC: 0 % (ref 0.0–0.2)

## 2019-07-12 LAB — I-STAT CHEM 8, ED
BUN: 10 mg/dL (ref 6–20)
Calcium, Ion: 1.03 mmol/L — ABNORMAL LOW (ref 1.15–1.40)
Chloride: 104 mmol/L (ref 98–111)
Creatinine, Ser: 0.9 mg/dL (ref 0.44–1.00)
Glucose, Bld: 96 mg/dL (ref 70–99)
HCT: 49 % — ABNORMAL HIGH (ref 36.0–46.0)
Hemoglobin: 16.7 g/dL — ABNORMAL HIGH (ref 12.0–15.0)
Potassium: 3.4 mmol/L — ABNORMAL LOW (ref 3.5–5.1)
Sodium: 142 mmol/L (ref 135–145)
TCO2: 26 mmol/L (ref 22–32)

## 2019-07-12 LAB — I-STAT BETA HCG BLOOD, ED (MC, WL, AP ONLY): I-stat hCG, quantitative: <5 m[IU]/mL (ref <5)

## 2019-07-12 MED ORDER — KETOROLAC TROMETHAMINE 30 MG/ML IJ SOLN
30.0000 mg | Freq: Once | INTRAMUSCULAR | Status: AC
  Administered 2019-07-12: 30 mg via INTRAVENOUS
  Filled 2019-07-12: qty 1

## 2019-07-12 MED ORDER — CEPHALEXIN 500 MG PO CAPS: 500.0000 mg | ORAL_CAPSULE | Freq: Four times a day (QID) | ORAL | 0 refills | Status: DC

## 2019-07-12 MED ORDER — CEFAZOLIN SODIUM-DEXTROSE 1-4 GM/50ML-% IV SOLN
1.0000 g | Freq: Once | INTRAVENOUS | Status: AC
  Administered 2019-07-12: 1 g via INTRAVENOUS
  Filled 2019-07-12: qty 50

## 2019-07-12 MED ORDER — NAPROXEN 375 MG PO TABS: 375.0000 mg | ORAL_TABLET | Freq: Two times a day (BID) | ORAL | 0 refills | Status: DC

## 2019-07-12 MED ORDER — METHOCARBAMOL 500 MG PO TABS: 500.0000 mg | ORAL_TABLET | Freq: Two times a day (BID) | ORAL | 0 refills | Status: DC

## 2019-07-12 MED ORDER — TETANUS-DIPHTH-ACELL PERTUSSIS 5-2.5-18.5 LF-MCG/0.5 IM SUSP: 0.5000 mL | Freq: Once | INTRAMUSCULAR | Status: DC
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
## 2019-07-12 NOTE — ED Notes (Signed)
Pt transported to CT ?

## 2019-07-15 ENCOUNTER — Encounter: Payer: Self-pay | Admitting: Internal Medicine

## 2019-07-15 ENCOUNTER — Ambulatory Visit (INDEPENDENT_AMBULATORY_CARE_PROVIDER_SITE_OTHER)
Admission: RE | Admit: 2019-07-15 | Discharge: 2019-07-15 | Disposition: A | Discharge status: Home / Self Care | Payer: 59 | Source: Ambulatory Visit | Attending: Internal Medicine | Admitting: Internal Medicine

## 2019-07-15 ENCOUNTER — Other Ambulatory Visit: Payer: Self-pay

## 2019-07-15 ENCOUNTER — Ambulatory Visit (INDEPENDENT_AMBULATORY_CARE_PROVIDER_SITE_OTHER): Payer: 59 | Admitting: Internal Medicine

## 2019-07-15 VITALS — BP 120/80 | HR 78 | Temp 98.2°F | Wt 155.0 lb

## 2019-07-15 DIAGNOSIS — M79641 Pain in right hand: Secondary | ICD-10-CM

## 2019-07-15 DIAGNOSIS — S01412A Laceration without foreign body of left cheek and temporomandibular area, initial encounter: Secondary | ICD-10-CM

## 2019-07-15 DIAGNOSIS — S00511A Abrasion of lip, initial encounter: Secondary | ICD-10-CM | POA: Diagnosis not present

## 2019-07-15 DIAGNOSIS — M79631 Pain in right forearm: Secondary | ICD-10-CM

## 2019-07-15 DIAGNOSIS — S61411A Laceration without foreign body of right hand, initial encounter: Secondary | ICD-10-CM

## 2019-07-15 NOTE — Patient Instructions (Signed)
Nonsutured Laceration Care °A laceration is a cut that may go through all layers of the skin and extend into the tissue that is right under the skin. This type of cut is usually stitched up (sutured) or closed with tape (adhesive strips) or skin glue shortly after the injury happens. °However, if the wound is dirty or if several hours pass before medical treatment is provided, it is likely that germs (bacteria) will enter the wound. Closing a laceration after bacteria have entered it increases the risk of infection. In these cases, your health care provider may leave the laceration open (nonsutured) and cover it with a bandage. This type of treatment helps prevent infection and allows the wound to heal from the deepest layer of tissue damage up to the surface. °An open fracture is a type of injury that may involve nonsutured lacerations. An open fracture is a break in a bone that happens along with lacerations through the skin at the fracture site. °What are the risks? °Caring for a nonsutured laceration is safe. However, problems may occur, including a higher risk for: °· Scarring. °· Infection. °· Slow healing. °Supplies needed: °· Soap. °· Hand sanitizer. °· Sterile water or irrigation solution. °· Bandages (dressings). °· Clean towel. °· Antibiotic ointment. °How to care for your nonsutured laceration °Follow instructions from your health care provider about how to take care of your wound. °· Keep the wound clean and dry. °· Change any dressings as told by your health care provider. This includes changing the dressing when it starts to smell, or when it gets wet or dirty. °· Clean the wound one time each day, or as often as told by your health care provider. To clean your wound: °1. Wash your hands with soap and water. If soap and water are not available, use hand sanitizer. °2. Remove any dressing as told by your health care provider. °3. Clean the wound with sterile water or irrigation solution as told by your  health care provider. °4. Pat the wound dry with a clean towel. Do not rub the wound. °5. Apply a thin layer of antibiotic ointment to the wound as told by your health care provider. This will prevent infection and keep the dressing from sticking to the wound. °6. Apply a new dressing as told by your health care provider. °· Check your wound every day for signs of infection. Watch for: °? Redness, swelling, or pain. °? Fluid, blood, or pus. °? Bad smell on the wound or dressing. °? Warmth. °· Do not take baths, swim, or do anything that puts your wound underwater until your health care provider approves. °· Do not scratch or pick at the wound. °Follow these instructions at home: °· Take or apply over-the-counter and prescription medicines only as told by your health care provider. °· If you were prescribed an antibiotic medicine, take or apply it as told by your health care provider. Do not stop using the antibiotic even if your condition improves. °· Do not inject anything into the wound unless directed by your health care provider. °· Raise (elevate) the injured area above the level of your heart while you are sitting or lying down, if possible. °· If directed, put ice on the affected area: °? Put ice in a plastic bag. °? Place a towel between your skin and the bag. °? Leave the ice on for 20 minutes, 2-3 times a day. °· Keep all follow-up visits as told by your health care provider. This is important. °  Contact a health care provider if: °· You received a tetanus shot and you have swelling, severe pain, redness, or bleeding at the injection site. °· You have a fever. °· Your pain is not controlled with medicine. °· You have increased redness, swelling, or pain at the site of your wound. °· You have fluid, blood, or pus coming from your wound. °· You notice a bad smell coming from your wound or your dressing. °· You notice something coming out of the wound, such as wood or glass. °· You notice a change in the color  of your skin near your wound. °· You develop a new rash. °· You need to change the dressing frequently due to fluid, blood, or pus draining from the wound. °· You develop numbness around your wound. °Get help right away if: °· Your pain suddenly increases and is severe. °· You develop severe swelling around the wound. °· The wound is on your hand or foot and you cannot properly move a finger or toe. °· The wound is on your hand or foot, and you notice that your fingers or toes look pale or bluish. °· You have a red streak going away from your wound. °Summary °· A laceration is a cut that may go through all layers of the skin and extend into the tissue that is right under the skin. It is usually closed with stitches, tape, or skin glue shortly after the injury happens. °· If a wound is dirty or if several hours pass before medical treatment is provided, the laceration may be kept open (nonsutured) and covered with a bandage. °· This type of treatment helps prevent infection and allows the wound to heal from the deepest layer of tissue damage up to the surface. °· Follow instructions from your health care provider about how to take care of your wound. °This information is not intended to replace advice given to you by your health care provider. Make sure you discuss any questions you have with your health care provider. °Document Revised: 08/02/2018 Document Reviewed: 04/30/2017 °Elsevier Patient Education © 2020 [employer] ° °
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
## 2019-07-18 ENCOUNTER — Telehealth: Payer: Self-pay | Admitting: *Deleted

## 2019-07-18 MED ORDER — FLUCONAZOLE 150 MG PO TABS: 150.0000 mg | ORAL_TABLET | Freq: Once | ORAL | 0 refills | Status: AC
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
## 2019-07-18 NOTE — Telephone Encounter (Signed)
Rx sent, patient informed.

## 2019-07-18 NOTE — Telephone Encounter (Signed)
Sure, fluconazole 150 mg po x 1 dose

## 2019-07-29 ENCOUNTER — Other Ambulatory Visit: Payer: Self-pay

## 2019-07-30 ENCOUNTER — Encounter: Payer: Self-pay | Admitting: Obstetrics and Gynecology

## 2019-07-30 ENCOUNTER — Encounter: Payer: Self-pay | Admitting: Family Medicine

## 2019-07-30 ENCOUNTER — Ambulatory Visit (INDEPENDENT_AMBULATORY_CARE_PROVIDER_SITE_OTHER): Payer: 59 | Admitting: Family Medicine

## 2019-07-30 ENCOUNTER — Ambulatory Visit (INDEPENDENT_AMBULATORY_CARE_PROVIDER_SITE_OTHER): Payer: 59 | Admitting: Obstetrics and Gynecology

## 2019-07-30 ENCOUNTER — Other Ambulatory Visit: Payer: Self-pay

## 2019-07-30 DIAGNOSIS — Z3046 Encounter for surveillance of implantable subdermal contraceptive: Secondary | ICD-10-CM

## 2019-07-30 DIAGNOSIS — M25441 Effusion, right hand: Secondary | ICD-10-CM

## 2019-07-30 MED ORDER — FLUCONAZOLE 150 MG PO TABS: 150.0000 mg | ORAL_TABLET | Freq: Once | ORAL | 1 refills | Status: AC

## 2019-07-30 MED ORDER — CLINDAMYCIN HCL 300 MG PO CAPS: 300.0000 mg | ORAL_CAPSULE | Freq: Three times a day (TID) | ORAL | 0 refills | Status: DC

## 2019-07-30 NOTE — Progress Notes (Signed)
   [name]  11-22-1985 [id number]  HPI The patient is a 34 y.o. [id number] who presents today for Nexplanon removal and placement of a new Nexplanon.  She says this would be her third Nexplanon.  She is counseled regarding the risks of infection, hematoma and bruising, and nerve disruption in the upper arm.  There can be problems with the device migrating deeper into the tissues requiring surgical removal.  Side effects including menstrual bleeding irregularities and/or changes, mood disorder, weight gain, headache, acne, depression, and ovarian cysts were all reviewed with the patient.  Less common side effects including nausea and dizziness breast pain, abdominal pain were also reviewed.  She agrees to proceed.  Physical Exam BP 122/78   Procedure note Nexplanon movable and insertion Nexplanon is palpated in the left upper extremity.  The insertion site skin scar and surrounding skin was cleansed with a Betadine swab.  The area was injected with 1.5 mL of 1% lidocaine.  Adequate anesthesia is confirmed.  A change to sterile gloves was made.  A small nick is made with the 11 blade scalpel.  Pressure was applied proximally on the arm resulting in expulsion of the edge of the Nexplanon device which was grasped with a hemostat and removed in its entirety with gentle traction applied to the device.    Immediately superior to the insertion site scar, there is an old insertion site scar from first time she had a Nexplanon insertion.  She did desire to have the Nexplanon inserted through the same insertion site scar.  The other previous insertion site scar was confirmed to be 10 cm proximal to the medial epicondyle of the humerus on the inner side of the left arm (as recommended by manufacturer's instructions).  The site was cleansed with Betadine swab.  1.5 mL of 1% lidocaine was injected just beneath the skin along the planned insertion tunnel.  A change to sterile gloves was made.  Maintaining  sterility, the Nexplanon applicator needle was used to puncture the skin with the tip of the needle angled slightly less than 30 and the applicator was lowered to horizontal position while lifting the skin at the tip of the needle to slide the needle to its full length.  The slider is moved back fully and the implant was placed into its final subdermal position.  The presence of the implant was verified by palpation.  The site was then dressed in a normal fashion.  The patient tolerated the procedure well.  Caryn Bee was present for the procedure    Joseph Pierini MD, [facility] 07/30/19
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
## 2019-07-30 NOTE — Patient Instructions (Signed)
Working on a hand specialist referral as soon as we can get you in   Avoid trauma to finger  Start the clindamycin -take with food   If worse let us know ( increased redness or swelling)

## 2019-07-31 ENCOUNTER — Encounter: Payer: Self-pay | Admitting: Gynecology

## 2019-07-31 ENCOUNTER — Telehealth: Payer: Self-pay | Admitting: Family Medicine

## 2019-07-31 NOTE — Telephone Encounter (Signed)
Thanks for the update.  I cannot answer that question- she will need to ask the orthopedic doctor what they think

## 2019-07-31 NOTE — Telephone Encounter (Signed)
Pt was able to get in with [facility] today. They stated it will take more time for her hand to heal. She will do exercises and follow up with their office in a month. She is wondering if she should stay out of work until then to let her hand heal. Please advise.

## 2019-08-01 NOTE — Telephone Encounter (Signed)
Patient called back about message Advised of message from Dr Glori Bickers. Patient voiced understanding and stated she would reach out to the orthopedic doctor

## 2019-08-07 ENCOUNTER — Other Ambulatory Visit: Payer: Self-pay | Admitting: Internal Medicine

## 2019-08-11 ENCOUNTER — Telehealth: Payer: Self-pay

## 2019-08-11 NOTE — Telephone Encounter (Signed)
I agree with that advisement - will cc her PCP as well
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
## 2019-08-14 ENCOUNTER — Telehealth: Payer: Self-pay | Admitting: Internal Medicine

## 2019-08-14 NOTE — Telephone Encounter (Signed)
I have no other recommendations, she should follow with ortho.

## 2019-08-14 NOTE — Telephone Encounter (Signed)
Note indicates they want her to f/u in 4 weeks for re eval.  If she still has limited motion they would consider PT vs higher tier management/imaging  It sounds like the current plan holds upon continuing to wait for scar tissue to improve/which can take a while.   Since I am not a hand specialist- not much more I can offer  I will cc to pcp and she can always f/u with her to review as well   Of course-if symptoms are worsening- please let us and the orthopedic office know

## 2019-08-14 NOTE — Telephone Encounter (Signed)
Patient was in to see you on 4/7 for trouble with her finger.  Your referred to an orthopedic surgeon. She went to see them and patient stated they basically told her that it will work its self out. Patient said her finger is still giving her a lot of trouble and wanted to know what you suggest she do next. She is concerned that this will be a permament issue and she is trying to avoid that if at all possible   Please advise

## 2019-08-14 NOTE — Telephone Encounter (Signed)
Pt notified of Dr. Marliss Coots comments. Pt does want PCP to f/u to see if she can offer anything else to help her, I advise pt message was routed to PCP so she should hear from her/ CMA also with any other input

## 2019-08-15 ENCOUNTER — Telehealth: Payer: Self-pay | Admitting: Internal Medicine

## 2019-08-15 NOTE — Telephone Encounter (Signed)
Patient dropped off FMLA forms. Spoke with [place] regarding the paperwork. FMLA needs to be filled out by [facility].  Spoke with patient let her know that paperwork needs to be filled out by [facility]. Patient stated she would reach out to their office and if anything is needed from our office she would call back.

## 2019-12-03 ENCOUNTER — Encounter (HOSPITAL_COMMUNITY): Payer: Self-pay | Admitting: Emergency Medicine

## 2019-12-03 ENCOUNTER — Telehealth: Payer: Self-pay

## 2019-12-03 ENCOUNTER — Other Ambulatory Visit: Payer: Self-pay

## 2019-12-03 ENCOUNTER — Emergency Department (HOSPITAL_COMMUNITY)
Admission: EM | Admit: 2019-12-03 | Discharge: 2019-12-03 | Disposition: A | Discharge status: Home / Self Care | Payer: 59 | Attending: Emergency Medicine | Admitting: Emergency Medicine

## 2019-12-03 DIAGNOSIS — R0981 Nasal congestion: Secondary | ICD-10-CM | POA: Insufficient documentation

## 2019-12-03 DIAGNOSIS — J069 Acute upper respiratory infection, unspecified: Secondary | ICD-10-CM

## 2019-12-03 DIAGNOSIS — K0889 Other specified disorders of teeth and supporting structures: Secondary | ICD-10-CM | POA: Diagnosis not present

## 2019-12-03 DIAGNOSIS — R0789 Other chest pain: Secondary | ICD-10-CM | POA: Insufficient documentation

## 2019-12-03 DIAGNOSIS — N6022 Fibroadenosis of left breast: Secondary | ICD-10-CM | POA: Diagnosis not present

## 2019-12-03 DIAGNOSIS — Z87891 Personal history of nicotine dependence: Secondary | ICD-10-CM | POA: Insufficient documentation

## 2019-12-03 DIAGNOSIS — J029 Acute pharyngitis, unspecified: Secondary | ICD-10-CM | POA: Insufficient documentation

## 2019-12-03 DIAGNOSIS — R05 Cough: Secondary | ICD-10-CM | POA: Diagnosis not present

## 2019-12-03 DIAGNOSIS — R519 Headache, unspecified: Secondary | ICD-10-CM | POA: Insufficient documentation

## 2019-12-03 DIAGNOSIS — Z20822 Contact with and (suspected) exposure to covid-19: Secondary | ICD-10-CM

## 2019-12-03 LAB — SARS CORONAVIRUS 2 BY RT PCR (HOSPITAL ORDER, PERFORMED IN ~~LOC~~ HOSPITAL LAB): SARS Coronavirus 2: NEGATIVE

## 2019-12-03 NOTE — Telephone Encounter (Signed)
[facility] - Client TELEPHONE ADVICE RECORD [facility] Patient Name: [name] Gender: Female DOB: [birth year] Age: 34 Y 58 M 12 D Return Phone Number: [phone] (Primary) Address: City/State/Zip: [place]  [address] Client [facility] - Client Client Site Reader - Day Physician Webb Silversmith - NP Contact Type Call Who Is Calling Patient / Member / Family / Caregiver Call Type Triage / Clinical Relationship To Patient Self Return Phone Number [phone] (Primary) Chief Complaint CHEST PAIN (>=21 years) - pain, pressure, heaviness or tightness Reason for Call Symptomatic / Request for [place] caller was transferred from call center to get triage for Chest pains and sore throat -- caller thinks she may need to get tested for covid [place] Not Listed [place] Translation No Nurse Assessment Nurse: Rock Nephew, RN, Juliann Pulse Date/Time (Eastern Time): 12/03/2019 1:18:54 PM Confirm and document reason for call. If symptomatic, describe symptoms. ---Caller was transferred from call center to get triage for chest pains and sore throat -- caller thinks she may need to get tested for COVID exposure 2 weeks ago. Has the patient had close contact with a person known or suspected to have the novel coronavirus illness OR traveled / lives in area with major community spread (including international travel) in the last 14 days from the onset of symptoms? * If Asymptomatic, screen for exposure and travel within the last 14 days. ---Yes Does the patient have any new or worsening symptoms? ---Yes Will a triage be completed? ---Yes Related visit to physician within the last 2 weeks? ---No Does the PT have any chronic conditions? (i.e. diabetes, asthma, this includes High risk factors for pregnancy, etc.) ---No Is the patient pregnant or possibly pregnant? (Ask all  females between the ages of 14-55) ---No Is this a behavioral health or substance abuse call? ---No Guidelines Guideline Title Affirmed Question Affirmed Notes Nurse Date/Time (Eastern Time) COVID-19 - Diagnosed or Suspected SEVERE or constant chest pain or pressure (Exception: mild central [place], RN, Juliann Pulse 12/03/2019 1:19:43 PM PLEASE NOTE: All timestamps contained within this report are represented as [place] Standard Time. CONFIDENTIALTY NOTICE: This fax transmission is intended only for the addressee. It contains information that is legally privileged, confidential or otherwise protected from use or disclosure. If you are not the intended recipient, you are strictly prohibited from reviewing, disclosing, copying using or disseminating any of this information or taking any action in reliance on or regarding this information. If you have received this fax in error, please notify us immediately by telephone so that we can arrange for its return to [place]. Phone: [phone], Toll-Free: [phone], Fax: [phone] Page: 2 of 2 Call Id: [id number] Guidelines Guideline Title Affirmed Question Affirmed Notes Nurse Date/Time Eilene Ghazi Time) chest pain, present only when coughing) Disp. Time Eilene Ghazi Time) Disposition Final User 12/03/2019 1:16:51 PM Send to Urgent Daron Offer, [place] 12/03/2019 1:22:13 PM Go to ED Now Yes Rock Nephew, RN, Gara Kroner Disagree/Comply Comply Caller Understands Yes PreDisposition Call Doctor Care Advice Given Per Guideline GO TO ED NOW: * You need to be seen in the Emergency Department. WEAR A MASK - COVER YOUR MOUTH AND NOSE: * Wear a mask. YOU SHOULD TELL HEALTHCARE PERSONNEL THAT YOU MIGHT HAVE COVID-19: * Tell the first healthcare worker you meet that you may have COVID-19. * Tell them you have symptoms and have been sent for COVID-19 testing. CARE ADVICE given per COVID-19 - DIAGNOSED OR SUSPECTED (Adult) guideline. ANOTHER  ADULT SHOULD DRIVE: * It is  better and safer if another adult drives instead of you. Referrals GO TO FACILITY OTHER - SPECIFY

## 2019-12-03 NOTE — Telephone Encounter (Signed)
Per chart review tab pt is at [facility] ED. 

## 2019-12-03 NOTE — Telephone Encounter (Signed)
Will review ER chart once complete.

## 2019-12-03 NOTE — ED Provider Notes (Signed)
[facility] DEPT Provider Note   CSN: [id number] Arrival date & time: 12/03/19  1428     History Chief Complaint  Patient presents with  . Sore Throat    covid exposure    [name] is a 34 y.o. female presented to the emergency department with concern for Covid exposure.  The patient reports she was exposed to someone with Covid approximately 2 weeks ago.  5 days ago she began developing a sore throat.  She also describes sinus congestion, pain in her teeth, headache, and cough and congestion.  She lives with a 58-year-old in her house who is not sick.  She has not been vaccinated yet for Covid but had one scheduled for this month.  She denies any respiratory problems.  She does smoke marijuana daily.  She is otherwise not a nicotine smoker.  She reports some sharp right sided chest pain for 2 days, worse with coughing.  It is not persistent.  No hemoptysis or asymmetric LE edema. Patient denies personal or family history of DVT or PE. No recent hormone use (including OCP); travel for >6 hours; prolonged immobilization for greater than 3 days; surgeries or trauma in the last 4 weeks; or malignancy with treatment within 6 months.   HPI     Past Medical History:  Diagnosis Date  . ASCUS of cervix with negative high risk HPV 05/2016  . Chicken pox   . Elevated prolactin level    30 range 12/2008, normal X multiple repeats, most recent 18 04/2011  . Fibroadenoma of breast    LEFT  . IBS (irritable bowel syndrome)   . LGSIL (low grade squamous intraepithelial dysplasia) 12/2008, 02/2011   C&B WITH LGSIL 02/2011  . MRSA (methicillin resistant staph aureus) culture positive 04/2014   vulvar abscess  . STD (sexually transmitted disease)    History of Chlamydia    Patient Active Problem List   Diagnosis Date Noted  . Finger joint swelling, right 07/30/2019  . Excessive sweating 06/18/2014  . IBS (irritable colon syndrome) 06/29/2011     Past Surgical History:  Procedure Laterality Date  . ADENOIDECTOMY     ASA CHILD  . CESAREAN SECTION N/A 03/06/2013   Procedure: CESAREAN SECTION;  Surgeon: Melina Schools, MD;  Location: [facility];  Service: Obstetrics;  Laterality: N/A;  . COLPOSCOPY    . DILATION AND CURETTAGE OF UTERUS    . INDUCED ABORTION  [id number]   x2  . Nexplanon insertion  08/08/2016     OB History    Gravida  5   Para  3   Term  3   Preterm      AB  2   Living  2     SAB  1   TAB  1   Ectopic      Multiple      Live Births  2           Family History  Problem Relation Age of Onset  . Other Brother        TB  . Aneurysm Maternal Grandfather   . Cancer Maternal Grandmother        Lymphoma  . Alcohol abuse Neg Hx   . Arthritis Neg Hx   . Asthma Neg Hx   . Birth defects Neg Hx   . COPD Neg Hx   . Depression Neg Hx   . Diabetes Neg Hx   . Drug abuse Neg Hx   .  Early death Neg Hx   . Hearing loss Neg Hx   . Heart disease Neg Hx   . Hyperlipidemia Neg Hx   . Hypertension Neg Hx   . Kidney disease Neg Hx   . Learning disabilities Neg Hx   . Mental illness Neg Hx   . Mental retardation Neg Hx   . Miscarriages / Stillbirths Neg Hx   . Vision loss Neg Hx   . Varicose Veins Neg Hx     Social History   Tobacco Use  . Smoking status: Former Smoker    Types: Cigarettes    Quit date: 04/12/2010    Years since quitting: 9.6  . Smokeless tobacco: Never Used  . Tobacco comment: Maybe 1 cigarette every 2 days  Vaping Use  . Vaping Use: Never used  Substance Use Topics  . Alcohol use: Yes    Alcohol/week: 0.0 standard drinks    Comment: Occasional  . Drug use: No    Home Medications Prior to Admission medications   Medication Sig Start Date End Date Taking? Authorizing Provider  aluminum chloride (DRYSOL) 20 % external solution APPLY EXTERNALLY TO THE AFFECTED AREA AT BEDTIME *must schedule physical* 08/07/19   Jearld Fenton, NP  clindamycin (CLEOCIN) 300 MG  capsule Take 1 capsule (300 mg total) by mouth 3 (three) times daily. 07/30/19   Tower, Wynelle Fanny, MD  etonogestrel (NEXPLANON) 68 MG IMPL implant 1 each by Subdermal route once.    [provider]  naproxen (NAPROSYN) 375 MG tablet Take 1 tablet (375 mg total) by mouth 2 (two) times daily. 07/12/19   Palumbo, April, MD    Allergies    Banana and Flagyl [metronidazole]  Review of Systems   Review of Systems  Constitutional: Positive for chills and fatigue. Negative for fever.  HENT: Positive for congestion and sinus pressure.   Eyes: Negative for pain and visual disturbance.  Respiratory: Positive for cough. Negative for shortness of breath.   Cardiovascular: Positive for chest pain. Negative for palpitations.  Gastrointestinal: Positive for nausea. Negative for abdominal pain and vomiting.  Genitourinary: Negative for dysuria and hematuria.  Musculoskeletal: Positive for myalgias. Negative for back pain.  Skin: Negative for color change and rash.  Neurological: Positive for headaches. Negative for syncope.  Psychiatric/Behavioral: Negative for agitation and confusion.  All other systems reviewed and are negative.   Physical Exam Updated Vital Signs BP 118/64   Pulse 89   Temp 98.1 F (36.7 C) (Oral)   Resp 18   SpO2 100%   Physical Exam Vitals and nursing note reviewed.  Constitutional:      General: She is not in acute distress.    Appearance: She is well-developed.  HENT:     Head: Normocephalic and atraumatic.  Eyes:     Conjunctiva/sclera: Conjunctivae normal.  Cardiovascular:     Rate and Rhythm: Normal rate and regular rhythm.     Heart sounds: Normal heart sounds.  Pulmonary:     Effort: Pulmonary effort is normal. No respiratory distress.     Breath sounds: Normal breath sounds.  Abdominal:     Palpations: Abdomen is soft.     Tenderness: There is no abdominal tenderness.  Musculoskeletal:     Cervical back: Neck supple.  Skin:    General: Skin is  warm and dry.  Neurological:     General: No focal deficit present.     Mental Status: She is alert and oriented to person, place, and time.  ED Results / Procedures / Treatments   Labs (all labs ordered are listed, but only abnormal results are displayed) Labs Reviewed  SARS CORONAVIRUS 2 BY RT PCR (HOSPITAL ORDER, [facility])    EKG None  Radiology No results found.  Procedures Procedures (including critical care time)  Medications Ordered in ED Medications - No data to display  ED Course  I have reviewed the triage vital signs and the nursing notes.  Pertinent labs & imaging results that were available during my care of the patient were reviewed by me and considered in my medical decision making (see chart for details).  34 yo female here with viral syndrome for several days, including sinus congestion.  No fever today.  Doubtful of bacterial sinusitis or sepsis.  Concerned about covid exposure, and not vaccinated.  We'll test her here.  Otherwise okay for discharge with symptomatic mgmt.  She is well appearing on exam.  Chest pain, likely related to viral bronchitis  PERC negative.  Highly doubtful of PE or ACS at this time.  Doubt PTX - good breath sounds bilaterally  [name] was evaluated in Emergency Department on 12/04/2019 for the symptoms described in the history of present illness. She was evaluated in the context of the global COVID-19 pandemic, which necessitated consideration that the patient might be at risk for infection with the SARS-CoV-2 virus that causes COVID-19. Institutional protocols and algorithms that pertain to the evaluation of patients at risk for COVID-19 are in a state of rapid change based on information released by regulatory bodies including the CDC and federal and state organizations. These policies and algorithms were followed during the patient's care in the ED.   Final Clinical Impression(s) / ED  Diagnoses Final diagnoses:  Close exposure to COVID-19 virus  Viral URI with cough    Rx / DC Orders ED Discharge Orders    None       Kinslie Hove, Carola Rhine, MD 12/04/19 [phone]

## 2019-12-03 NOTE — ED Triage Notes (Signed)
Pt had covid exposure. Reports sore throat and cough since yesterday.

## 2019-12-09 ENCOUNTER — Ambulatory Visit: Payer: 59 | Admitting: Internal Medicine

## 2020-05-13 ENCOUNTER — Telehealth (INDEPENDENT_AMBULATORY_CARE_PROVIDER_SITE_OTHER): Payer: 59 | Admitting: Family Medicine

## 2020-05-13 ENCOUNTER — Encounter: Payer: Self-pay | Admitting: Family Medicine

## 2020-05-13 ENCOUNTER — Other Ambulatory Visit: Payer: Self-pay

## 2020-05-13 ENCOUNTER — Other Ambulatory Visit: Payer: Self-pay | Admitting: Family Medicine

## 2020-05-13 ENCOUNTER — Telehealth: Payer: Self-pay

## 2020-05-13 DIAGNOSIS — J069 Acute upper respiratory infection, unspecified: Secondary | ICD-10-CM | POA: Diagnosis not present

## 2020-05-13 NOTE — Telephone Encounter (Signed)
An apt came available with Dr. Glori Bickers so pt was scheduled for VV at 4pm. Pt agreed and is aware. Advised to have any vital signs ready for apt.

## 2020-05-13 NOTE — Assessment & Plan Note (Addendum)
And fever  Suspect covid (will test for covid and flu)  Symptomatic care  Watch for sob / ER parameters reviewed  Declines px nausea med at this time Disc imp of hydration  Disc isolation until results  See AVS Pt has one covid vaccine in a series but not the 2nd

## 2020-05-13 NOTE — Patient Instructions (Signed)
Drink fluids and rest  Tylenol or ibuprofen (with food) for fever /headache and body aches  Delsym or mucinex dm for cough/congestion if needed Nasal saline spray helps also   The office will call you to set up swabs for covid and flu   If symptoms become suddenly severe -especially shortness of breath, go to the ER Also keep [place] posted

## 2020-05-13 NOTE — Progress Notes (Signed)
Virtual Visit via Video Note  I connected with [place] on 05/13/20 at  4:00 PM EST by a video enabled telemedicine application and verified that I am speaking with the correct person using two identifiers.  Location: Patient: home Provider: office   I discussed the limitations of evaluation and management by telemedicine and the availability of in person appointments. The patient expressed understanding and agreed to proceed.  Parties involved in encounter  Patient: [name]  Provider:  Loura Pardon MD   History of Present Illness: 35 yo pt of NP [place] presents with uri symptoms and fever   Symptoms started this am  Woke up feeling horrible   (just a little headache last night)  Bad headache (all over/throbbing)  Mild cough - not much  Not very congested  ST - yesterday/ improved today  Some ear pain   Some diarrhea since yesterday -then 3 times last night  Some nausea earlier- better today  No vomiting   Taste/smell-normal   Sick contacts-none known   Temp is 100.7 today  Highest so far   Some chills and body aches  imm status  Had one immunization -did not go back for the 2nd  November  No flu shot this season   Has not had a covid test    otc  Tylenol  mucinex  emergen C  Drinking fluids     Patient Active Problem List   Diagnosis Date Noted  . URI with cough and congestion 05/13/2020  . Finger joint swelling, right 07/30/2019  . Excessive sweating 06/18/2014  . IBS (irritable colon syndrome) 06/29/2011   Past Medical History:  Diagnosis Date  . ASCUS of cervix with negative high risk HPV 05/2016  . Chicken pox   . Elevated prolactin level    30 range 12/2008, normal X multiple repeats, most recent 18 04/2011  . Fibroadenoma of breast    LEFT  . IBS (irritable bowel syndrome)   . LGSIL (low grade squamous intraepithelial dysplasia) 12/2008, 02/2011   C&B WITH LGSIL 02/2011  . MRSA (methicillin resistant staph aureus) culture  positive 04/2014   vulvar abscess  . STD (sexually transmitted disease)    History of Chlamydia   Past Surgical History:  Procedure Laterality Date  . ADENOIDECTOMY     ASA CHILD  . CESAREAN SECTION N/A 03/06/2013   Procedure: CESAREAN SECTION;  Surgeon: Melina Schools, MD;  Location: [facility];  Service: Obstetrics;  Laterality: N/A;  . COLPOSCOPY    . DILATION AND CURETTAGE OF UTERUS    . INDUCED ABORTION  [id number]   x2  . Nexplanon insertion  08/08/2016   Social History   Tobacco Use  . Smoking status: Former Smoker    Types: Cigarettes    Quit date: 04/12/2010    Years since quitting: 10.0  . Smokeless tobacco: Never Used  . Tobacco comment: Maybe 1 cigarette every 2 days  Vaping Use  . Vaping Use: Never used  Substance Use Topics  . Alcohol use: Yes    Alcohol/week: 0.0 standard drinks    Comment: Occasional  . Drug use: No   Family History  Problem Relation Age of Onset  . Other Brother        TB  . Aneurysm Maternal Grandfather   . Cancer Maternal Grandmother        Lymphoma  . Alcohol abuse Neg Hx   . Arthritis Neg Hx   . Asthma Neg Hx   . Birth defects  Neg Hx   . COPD Neg Hx   . Depression Neg Hx   . Diabetes Neg Hx   . Drug abuse Neg Hx   . Early death Neg Hx   . Hearing loss Neg Hx   . Heart disease Neg Hx   . Hyperlipidemia Neg Hx   . Hypertension Neg Hx   . Kidney disease Neg Hx   . Learning disabilities Neg Hx   . Mental illness Neg Hx   . Mental retardation Neg Hx   . Miscarriages / Stillbirths Neg Hx   . Vision loss Neg Hx   . Varicose Veins Neg Hx    Allergies  Allergen Reactions  . Banana Itching  . Flagyl [Metronidazole] Hives and Itching   Current Outpatient Medications on File Prior to Visit  Medication Sig Dispense Refill  . aluminum chloride (DRYSOL) 20 % external solution APPLY EXTERNALLY TO THE AFFECTED AREA AT BEDTIME *must schedule physical* 35 mL 0  . etonogestrel (NEXPLANON) 68 MG IMPL implant 1 each by Subdermal route  once.    . naproxen (NAPROSYN) 375 MG tablet Take 1 tablet (375 mg total) by mouth 2 (two) times daily. 20 tablet 0   No current facility-administered medications on file prior to visit.   Review of Systems  Constitutional: Positive for malaise/fatigue. Negative for chills and fever.  HENT: Positive for congestion and sore throat. Negative for ear pain and sinus pain.   Eyes: Negative for blurred vision, discharge and redness.  Respiratory: Positive for cough. Negative for sputum production, shortness of breath and stridor.   Cardiovascular: Negative for chest pain, palpitations and leg swelling.  Gastrointestinal: Positive for nausea. Negative for abdominal pain, diarrhea and vomiting.  Musculoskeletal: Negative for myalgias.  Skin: Negative for rash.  Neurological: Positive for headaches. Negative for dizziness.    Observations/Objective: Patient appears well, in no distress (seems fatigued)  Weight is baseline  No facial swelling or asymmetry Voice is hoarse No obvious tremor or mobility impairment Moving neck and UEs normally Able to hear the call well  No cough or shortness of breath during interview  Talkative and mentally sharp with no cognitive changes No skin changes on face or neck , no rash or pallor Affect is normal    Assessment and Plan: Problem List Items Addressed This Visit      Respiratory   URI with cough and congestion    And fever  Suspect covid (will test for covid and flu)  Symptomatic care  Watch for sob / ER parameters reviewed  Declines px nausea med at this time Disc imp of hydration  Disc isolation until results  See AVS Pt has one covid vaccine in a series but not the 2nd           Follow Up Instructions: Drink fluids and rest  Tylenol or ibuprofen (with food) for fever /headache and body aches  Delsym or mucinex dm for cough/congestion if needed Nasal saline spray helps also   The office will call you to set up swabs for covid and  flu   If symptoms become suddenly severe -especially shortness of breath, go to the ER Also keep [place] posted    I discussed the assessment and treatment plan with the patient. The patient was provided an opportunity to ask questions and all were answered. The patient agreed with the plan and demonstrated an understanding of the instructions.   The patient was advised to call back or seek an in-person evaluation if  the symptoms worsen or if the condition fails to improve as anticipated.     Loura Pardon, MD

## 2020-05-13 NOTE — Telephone Encounter (Signed)
Pt left VM on triage line. Contacted pt c/o waking up this morning with HA, chills, fever 99.7, sore throat(only hurts when she coughs), body aches, nausea, fatigue, and diarrhea x3(last night). Denies any covid exposure. Pt has not taken any OTC meds  But reports her HA is very bad. Advised pt to take tylenol and she said her mother dropped some off but she forgot to take it. Pt is taking now. Pt denies SOB or changes in taste or smell. Advised pt there are no VV available today but she should be assessed at an UC. Pt said her head hurt too bad to get up. Advised pt her insurance will have VV apts and to give them a call and if she needs testing to call back and we can give her directions to get testing at one of the outside [facility] sites. Pt said she would call her insurance VV providers now. Advised if any symptoms worsened or she developed any new symptoms to contact this office. Advised of ER precautions. Pt verbalized understanding.

## 2020-05-14 ENCOUNTER — Other Ambulatory Visit (INDEPENDENT_AMBULATORY_CARE_PROVIDER_SITE_OTHER): Payer: 59

## 2020-05-14 DIAGNOSIS — J069 Acute upper respiratory infection, unspecified: Secondary | ICD-10-CM

## 2020-05-14 LAB — POC INFLUENZA A&B (BINAX/QUICKVUE)
Influenza A, POC: NEGATIVE
Influenza B, POC: NEGATIVE

## 2020-05-16 LAB — NOVEL CORONAVIRUS, NAA: SARS-CoV-2, NAA: DETECTED — AB

## 2020-05-16 LAB — SARS-COV-2, NAA 2 DAY TAT

## 2020-05-17 ENCOUNTER — Telehealth: Payer: Self-pay | Admitting: *Deleted

## 2020-05-17 NOTE — Telephone Encounter (Signed)
Called to discuss with patient about COVID-19 symptoms and the use of one of the available treatments for those with mild to moderate Covid symptoms and at a high risk of hospitalization.  Pt appears to qualify for outpatient treatment due to co-morbid conditions and/or a member of an at-risk group in accordance with the FDA Emergency Use Authorization.    Symptom onset:  Vaccinated:  Booster?  Immunocompromised?  Qualifiers:   Unable to reach pt - Left VM to return call for information  Marlo Goodrich Kaye   

## 2020-06-29 ENCOUNTER — Ambulatory Visit: Payer: 59 | Admitting: Internal Medicine

## 2020-07-05 ENCOUNTER — Other Ambulatory Visit: Payer: Self-pay

## 2020-07-05 ENCOUNTER — Ambulatory Visit (INDEPENDENT_AMBULATORY_CARE_PROVIDER_SITE_OTHER): Payer: 59 | Admitting: Internal Medicine

## 2020-07-05 ENCOUNTER — Encounter: Payer: Self-pay | Admitting: Internal Medicine

## 2020-07-05 VITALS — BP 110/70 | HR 88 | Temp 97.9°F | Wt 151.0 lb

## 2020-07-05 DIAGNOSIS — Z01818 Encounter for other preprocedural examination: Secondary | ICD-10-CM | POA: Diagnosis not present

## 2020-07-05 DIAGNOSIS — Z113 Encounter for screening for infections with a predominantly sexual mode of transmission: Secondary | ICD-10-CM

## 2020-07-05 LAB — CBC
HCT: 45 % (ref 36.0–46.0)
Hemoglobin: 15.1 g/dL — ABNORMAL HIGH (ref 12.0–15.0)
MCHC: 33.6 g/dL (ref 30.0–36.0)
MCV: 86.7 fl (ref 78.0–100.0)
Platelets: 277 10*3/uL (ref 150.0–400.0)
RBC: 5.19 Mil/uL — ABNORMAL HIGH (ref 3.87–5.11)
RDW: 16 % — ABNORMAL HIGH (ref 11.5–15.5)
WBC: 6.9 10*3/uL (ref 4.0–10.5)

## 2020-07-05 LAB — HEMOGLOBIN A1C: Hgb A1c MFr Bld: 5.6 % (ref 4.6–6.5)

## 2020-07-05 LAB — TSH: TSH: 0.84 u[IU]/mL (ref 0.35–4.50)

## 2020-07-05 LAB — COMPREHENSIVE METABOLIC PANEL
ALT: 23 U/L (ref 0–35)
AST: 29 U/L (ref 0–37)
Albumin: 4.9 g/dL (ref 3.5–5.2)
Alkaline Phosphatase: 57 U/L (ref 39–117)
BUN: 14 mg/dL (ref 6–23)
CO2: 27 mEq/L (ref 19–32)
Calcium: 10.1 mg/dL (ref 8.4–10.5)
Chloride: 100 mEq/L (ref 96–112)
Creatinine, Ser: 0.81 mg/dL (ref 0.40–1.20)
GFR: 95.69 mL/min (ref >60.00)
Glucose, Bld: 93 mg/dL (ref 70–99)
Potassium: 3.8 mEq/L (ref 3.5–5.1)
Sodium: 138 mEq/L (ref 135–145)
Total Bilirubin: 1.9 mg/dL — ABNORMAL HIGH (ref 0.2–1.2)
Total Protein: 7.9 g/dL (ref 6.0–8.3)

## 2020-07-05 LAB — PROTIME-INR
INR: 1.1 ratio — ABNORMAL HIGH (ref 0.8–1.0)
Prothrombin Time: 12.7 s (ref 9.6–13.1)

## 2020-07-05 LAB — T4, FREE: Free T4: 1.26 ng/dL (ref 0.60–1.60)

## 2020-07-05 LAB — APTT: aPTT: 36.1 s — ABNORMAL HIGH (ref 23.4–32.7)

## 2020-07-05 NOTE — Progress Notes (Signed)
Subjective:    Patient ID: [name], female    DOB: [birth year], 35 y.o.   MRN: [id number]  HPI  Pt presents to the clinic today for preoperative clearance. She is scheduled to have liposuction with fat transfer to buttocks on 4/11 by Dr. Minna Antis in [place].  She also would like to be screened for STD's. She denies symptoms or exposure at this time.  Review of Systems      Past Medical History:  Diagnosis Date  . ASCUS of cervix with negative high risk HPV 05/2016  . Chicken pox   . Elevated prolactin level    30 range 12/2008, normal X multiple repeats, most recent 18 04/2011  . Fibroadenoma of breast    LEFT  . IBS (irritable bowel syndrome)   . LGSIL (low grade squamous intraepithelial dysplasia) 12/2008, 02/2011   C&B WITH LGSIL 02/2011  . MRSA (methicillin resistant staph aureus) culture positive 04/2014   vulvar abscess  . STD (sexually transmitted disease)    History of Chlamydia    Current Outpatient Medications  Medication Sig Dispense Refill  . aluminum chloride (DRYSOL) 20 % external solution APPLY EXTERNALLY TO THE AFFECTED AREA AT BEDTIME *must schedule physical* 35 mL 0  . etonogestrel (NEXPLANON) 68 MG IMPL implant 1 each by Subdermal route once.    . naproxen (NAPROSYN) 375 MG tablet Take 1 tablet (375 mg total) by mouth 2 (two) times daily. 20 tablet 0   No current facility-administered medications for this visit.    Allergies  Allergen Reactions  . Banana Itching  . Flagyl [Metronidazole] Hives and Itching    Family History  Problem Relation Age of Onset  . Other Brother        TB  . Aneurysm Maternal Grandfather   . Cancer Maternal Grandmother        Lymphoma  . Alcohol abuse Neg Hx   . Arthritis Neg Hx   . Asthma Neg Hx   . Birth defects Neg Hx   . COPD Neg Hx   . Depression Neg Hx   . Diabetes Neg Hx   . Drug abuse Neg Hx   . Early death Neg Hx   . Hearing loss Neg Hx   . Heart disease Neg Hx   . Hyperlipidemia Neg  Hx   . Hypertension Neg Hx   . Kidney disease Neg Hx   . Learning disabilities Neg Hx   . Mental illness Neg Hx   . Mental retardation Neg Hx   . Miscarriages / Stillbirths Neg Hx   . Vision loss Neg Hx   . Varicose Veins Neg Hx     Social History   Socioeconomic History  . Marital status: Single    Spouse name: Not on file  . Number of children: 1  . Years of education: Not on file  . Highest education level: Not on file  Occupational History  . 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 Smoking status: Former Smoker    Types: Cigarettes    Quit date: 04/12/2010    Years since quitting: 10.2  . Smokeless tobacco: Never Used  . Tobacco comment: Maybe 1 cigarette every 2 days  Vaping Use  . Vaping Use: Never used  Substance and Sexual Activity  . Alcohol use: Yes    Alcohol/week: 0.0 standard drinks    Comment: Occasional  . Drug use: No  . Sexual activity: Yes  Partners: Male    Birth control/protection: Implant    Comment: 1st intercourse 35 yo-More than 5 partners Nexplanon 08/08/2016  Other Topics Concern  . Not on file  Social History Narrative  . Not on file   Social Determinants of Health   Financial Resource Strain: Not on file  Food Insecurity: Not on file  Transportation Needs: Not on file  Physical Activity: Not on file  Stress: Not on file  Social Connections: Not on file  Intimate Partner Violence: Not on file     Constitutional: Denies fever, malaise, fatigue, headache or abrupt weight changes.  HEENT: Denies eye pain, eye redness, ear pain, ringing in the ears, wax buildup, runny nose, nasal congestion, bloody nose, or sore throat. Respiratory: Denies difficulty breathing, shortness of breath, cough or sputum production.   Cardiovascular: Denies chest pain, chest tightness, palpitations or swelling in the hands or feet.  Gastrointestinal: Denies abdominal pain, bloating, constipation, diarrhea or blood in the  stool.  GU: Denies urgency, frequency, pain with urination, burning sensation, blood in urine, odor or discharge. Musculoskeletal: Denies decrease in range of motion, difficulty with gait, muscle pain or joint pain and swelling.  Skin: Denies redness, rashes, lesions or ulcercations.  Neurological: Denies dizziness, difficulty with memory, difficulty with speech or problems with balance and coordination.  Psych: Denies anxiety, depression, SI/HI.  No other specific complaints in a complete review of systems (except as listed in HPI above).  Objective:   Physical Exam  BP 110/70   Pulse 88   Temp 97.9 F (36.6 C) (Temporal)   Wt 151 lb (68.5 kg)   BMI 25.92 kg/m   Wt Readings from Last 3 Encounters:  07/30/19 155 lb 9 oz (70.6 kg)  07/15/19 155 lb (70.3 kg)  07/12/19 156 lb (70.8 kg)    General: Appears her stated age, well developed, well nourished in NAD. Skin: Warm, dry and intact.  HEENT: Head: normal shape and size; Eyes: sclera white, no icterus, conjunctiva pink, PERRLA and EOMs intact;  Neck:  Neck supple, trachea midline. No masses, lumps or thyromegaly present.  Cardiovascular: Normal rate and rhythm. S1,S2 noted.  No murmur, rubs or gallops noted. No JVD or BLE edema.  Pulmonary/Chest: Normal effort and positive vesicular breath sounds. No respiratory distress. No wheezes, rales or ronchi noted.  Abdomen: Soft and nontender. Normal bowel sounds.  Musculoskeletal: No difficulty with gait.  Neurological: Alert and oriented.  Psychiatric: Mood and affect normal. Behavior is normal. Judgment and thought content normal.     BMET    Component Value Date/Time   NA 142 07/12/2019 0149   K 3.4 (L) 07/12/2019 0149   CL 104 07/12/2019 0149   CO2 27 07/03/2019 0845   GLUCOSE 96 07/12/2019 0149   BUN 10 07/12/2019 0149   CREATININE 0.90 07/12/2019 0149   CREATININE 0.91 07/03/2019 0845   CALCIUM 9.4 07/03/2019 0845   GFRNONAA >60 03/24/2015 1113   GFRAA >60 03/24/2015  1113    Lipid Panel     Component Value Date/Time   CHOL 169 07/03/2019 0845   TRIG 73 07/03/2019 0845   HDL 47 (L) 07/03/2019 0845   CHOLHDL 3.6 07/03/2019 0845   VLDL 16 06/01/2016 1636   LDLCALC 106 (H) 07/03/2019 0845    CBC    Component Value Date/Time   WBC 7.0 07/12/2019 0131   RBC 5.34 (H) 07/12/2019 0131   HGB 16.7 (H) 07/12/2019 0149   HCT 49.0 (H) 07/12/2019 0149   PLT 280  07/12/2019 0131   MCV 90.3 07/12/2019 0131   MCH 28.3 07/12/2019 0131   MCHC 31.3 07/12/2019 0131   RDW 16.0 (H) 07/12/2019 0131   LYMPHSABS 3.0 07/12/2019 0131   MONOABS 0.6 07/12/2019 0131   EOSABS 0.1 07/12/2019 0131   BASOSABS 0.0 07/12/2019 0131    Hgb A1C Lab Results  Component Value Date   HGBA1C 5.8 (A) 12/12/2018            Assessment & Plan:  Preoperative Screening for BBL:  Indication for ECG: preoperative screening Interpretation of ECG: Comparison of ECG: 12/2017 unchanged Will obtain CBC, CMET, TSH, Free T4, T3, PT/INR, aptt, HIV, HCG and A1C today Preoperative physical form will be filled out once labs are back  Screen for STD:  HIV, Hep C, RPR, urine gonorrhea, chlamydia and trich  Return precautions discussed  Webb Silversmith, NP This visit occurred during the SARS-CoV-2 public health emergency.  Safety protocols were in place, including screening questions prior to the visit, additional usage of staff PPE, and extensive cleaning of exam room while observing appropriate contact time as indicated for disinfecting solutions.

## 2020-07-05 NOTE — Patient Instructions (Signed)
General Anesthesia, Adult General anesthesia is the use of medicines to make a person "go to sleep" (unconscious) for a medical procedure. General anesthesia must be used for certain procedures, and is often recommended for procedures that:  Last a long time.  Require you to be still or in an unusual position.  Are major and can cause blood loss. The medicines used for general anesthesia are called general anesthetics. As well as making you unconscious for a certain amount of time, these medicines:  Prevent pain.  Control your blood pressure.  Relax your muscles. Tell a health care provider about:  Any allergies you have.  All medicines you are taking, including vitamins, herbs, eye drops, creams, and over-the-counter medicines.  Any problems you or family members have had with anesthetic medicines.  Types of anesthetics you have had in the past.  Any blood disorders you have.  Any surgeries you have had.  Any medical conditions you have.  Any recent upper respiratory, chest, or ear infections.  Any history of: ? Heart or lung conditions, such as heart failure, sleep apnea, asthma, or chronic obstructive pulmonary disease (COPD). ? Armed forces logistics/support/administrative officer. ? Depression or anxiety.  Any tobacco or drug use, including marijuana or alcohol use.  Whether you are pregnant or may be pregnant. What are the risks? Generally, this is a safe procedure. However, problems may occur, including:  Allergic reaction.  Lung and heart problems.  Inhaling food or liquid from the stomach into the lungs (aspiration).  Nerve injury.  Dental injury.  Air in the bloodstream, which can lead to stroke.  Extreme agitation or confusion (delirium) when you wake up from the anesthetic.  Waking up during your procedure and being unable to move. This is rare. These problems are more likely to develop if you are having a major surgery or if you have an advanced or serious medical condition. You  can prevent some of these complications by answering all of your health care provider's questions thoroughly and by following all instructions before your procedure. General anesthesia can cause side effects, including:  Nausea or vomiting.  A sore throat from the breathing tube.  Hoarseness.  Wheezing or coughing.  Shaking chills.  Tiredness.  Body aches.  Anxiety.  Sleepiness or drowsiness.  Confusion or agitation. What happens before the procedure? Staying hydrated Follow instructions from your health care provider about hydration, which may include:  Up to 2 hours before the procedure - you may continue to drink clear liquids, such as water, clear fruit juice, black coffee, and plain tea.   Eating and drinking restrictions Follow instructions from your health care provider about eating and drinking, which may include:  8 hours before the procedure - stop eating heavy meals or foods such as meat, fried foods, or fatty foods.  6 hours before the procedure - stop eating light meals or foods, such as toast or cereal.  6 hours before the procedure - stop drinking milk or drinks that contain milk.  2 hours before the procedure - stop drinking clear liquids. Medicines Ask your health care provider about:  Changing or stopping your regular medicines. This is especially important if you are taking diabetes medicines or blood thinners.  Taking medicines such as aspirin and ibuprofen. These medicines can thin your blood. Do not take these medicines unless your health care provider tells you to take them.  Taking over-the-counter medicines, vitamins, herbs, and supplements. Do not take these during the week before your procedure unless your health  care provider approves them. General instructions  Starting 3-6 weeks before the procedure, do not use any products that contain nicotine or tobacco, such as cigarettes and e-cigarettes. If you need help quitting, ask your health care  provider.  If you brush your teeth on the morning of the procedure, make sure to spit out all of the toothpaste.  Tell your health care provider if you become ill or develop a cold, cough, or fever.  If instructed by your health care provider, bring your sleep apnea device with you on the day of your surgery (if applicable).  Ask your health care provider if you will be going home the same day, the following day, or after a longer hospital stay. ? Plan to have a responsible adult take you home from the hospital or clinic. ? Plan to have a responsible adult care for you for the time you are told after you leave the hospital or clinic. This is important. What happens during the procedure?  You will be given anesthetics through both of the following: ? A mask placed over your nose and mouth. ? An IV in one of your veins.  You may receive a medicine to help you relax (sedative).  After you are unconscious, a breathing tube may be inserted down your throat to help you breathe. This will be removed before you wake up.  An anesthesia specialist will stay with you throughout your procedure. He or she will: ? Keep you comfortable and safe by continuing to give you medicines and adjusting the amount of medicine that you get. ? Monitor your blood pressure, pulse, and oxygen levels to make sure that the anesthetics do not cause any problems. The procedure may vary among health care providers and hospitals.   What happens after the procedure?  Your blood pressure, temperature, heart rate, breathing rate, and blood oxygen level will be monitored until the medicines you were given have worn off.  You will wake up in a recovery area. You may wake up slowly.  If you feel anxious or agitated, you may be given medicine to help you calm down.  If you will be going home the same day, your health care provider may check to make sure you can walk, drink, and urinate.  Your health care provider will treat  any pain or side effects you have before you go home.  Do not drive or operate machinery until your health care provider says that it is safe. Summary  General anesthesia is used to keep you still and prevent pain during a procedure.  It is important to tell your health care provider about your medical history and any surgeries you have had, and previous experience with anesthesia.  Follow your health care provider's instructions about when to stop eating, drinking, or taking certain medicines before your procedure.  Plan to have a responsible adult take you home from the hospital or clinic. This information is not intended to replace advice given to you by your health care provider. Make sure you discuss any questions you have with your health care provider. Document Revised: 12/22/2019 Document Reviewed: 07/23/2019 Elsevier Patient Education  2021 [employer].

## 2020-07-06 LAB — C. TRACHOMATIS/N. GONORRHOEAE RNA
C. trachomatis RNA, TMA: NOT DETECTED
N. gonorrhoeae RNA, TMA: NOT DETECTED

## 2020-07-06 LAB — HCG, SERUM, QUALITATIVE: Preg, Serum: NEGATIVE

## 2020-07-06 LAB — T3: T3, Total: 106 ng/dL (ref 76–181)

## 2020-07-06 LAB — HEPATITIS C ANTIBODY
Hepatitis C Ab: NONREACTIVE
SIGNAL TO CUT-OFF: 0.03 (ref <1.00)

## 2020-07-06 LAB — RPR: RPR Ser Ql: NONREACTIVE

## 2020-07-06 LAB — HIV ANTIBODY (ROUTINE TESTING W REFLEX): HIV 1&2 Ab, 4th Generation: NONREACTIVE

## 2020-07-13 ENCOUNTER — Telehealth: Payer: Self-pay | Admitting: Internal Medicine

## 2020-07-13 NOTE — Telephone Encounter (Signed)
I have faxed form with progress notes, labs and EKG x 3 today

## 2020-07-13 NOTE — Telephone Encounter (Signed)
Patient called in stating that the surgeon never received the email of the surgical clearance. And they need the clearnce form and lab results and it can be either emailed to [email or web address] or faxed [phone]

## 2020-07-15 ENCOUNTER — Telehealth: Payer: Self-pay

## 2020-07-15 DIAGNOSIS — R791 Abnormal coagulation profile: Secondary | ICD-10-CM

## 2020-07-15 NOTE — Telephone Encounter (Signed)
Clotting factors were abnormal.

## 2020-07-15 NOTE — Telephone Encounter (Signed)
Pt left v/m that she had recent visit for medical clearance and pt received a call from surgeon's office that pt needs medical clearance from hematologist. Pt wants to know if anything in lab results that would indicate why pt has to have a hematology medical clearance. Pt request cb.

## 2020-07-16 NOTE — Telephone Encounter (Signed)
Pt would referral to [facility], but will go wherever may have the soonest appt

## 2020-07-18 NOTE — Addendum Note (Signed)
Addended by: Jearld Fenton on: 07/18/2020 10:54 AM   Modules accepted: Orders

## 2020-07-18 NOTE — Telephone Encounter (Cosign Needed)
Referral placed.

## 2020-07-29 ENCOUNTER — Encounter: Payer: Self-pay | Admitting: Nurse Practitioner

## 2020-07-29 ENCOUNTER — Other Ambulatory Visit: Payer: Self-pay

## 2020-07-29 ENCOUNTER — Ambulatory Visit (INDEPENDENT_AMBULATORY_CARE_PROVIDER_SITE_OTHER): Payer: 59 | Admitting: Nurse Practitioner

## 2020-07-29 VITALS — BP 120/76 | HR 76 | Resp 16 | Wt 154.0 lb

## 2020-07-29 DIAGNOSIS — Z3046 Encounter for surveillance of implantable subdermal contraceptive: Secondary | ICD-10-CM | POA: Diagnosis not present

## 2020-07-29 NOTE — Patient Instructions (Signed)
Your Nexplanon was removed today and the hormones should be out of your body quickly. You can get pregnant very soon!  Your numbing medicine should wear off in a couple hours, then you may experience a little discomfort. You may use ice (over your bandage) and/or take ibuprofen if needed.  Please keep your pressure dressing on for 24 hours because this will minimize bleeding under the skin, minimizing the risk of bleeding and swelling.  Try to leave the steri-strips on until they start to fall off on their own (about 3-5 days). You can wash over them, just pat it dry.  Pay attention to any signs of infection such as: Pain Redness Hot to the touch Drainage Or any other symptom that bothers you or doesn't seem normal  Please contact the office if any of those signs or symptoms occur.  It is hard to say when your next period will start, but it may happen in the next couple days since your body will recognize a drop in hormones. You may have 2 periods in the next month. If your bleeding is heavy (like 1 pad per hour), please call the office.  You may want to keep track of your bleeding in a journal or phone app. It may take a couple months for your body to regulate itself.

## 2020-07-29 NOTE — Progress Notes (Signed)
35 y.o. [id number] African American Single female presents for Nexplanon removal.   She has decided to use condoms for now until ready for near future conception.  Procedure, risks and benefits have all been explained.  She has no questions today.     LMP:  Rare and irregular  After all questions were answered, consent was obtained.    Past Medical History:  Diagnosis Date  . ASCUS of cervix with negative high risk HPV 05/2016  . Chicken pox   . Elevated prolactin level    30 range 12/2008, normal X multiple repeats, most recent 18 04/2011  . Fibroadenoma of breast    LEFT  . IBS (irritable bowel syndrome)   . LGSIL (low grade squamous intraepithelial dysplasia) 12/2008, 02/2011   C&B WITH LGSIL 02/2011  . MRSA (methicillin resistant staph aureus) culture positive 04/2014   vulvar abscess  . STD (sexually transmitted disease)    History of Chlamydia    Past Surgical History:  Procedure Laterality Date  . ADENOIDECTOMY     ASA CHILD  . CESAREAN SECTION N/A 03/06/2013   Procedure: CESAREAN SECTION;  Surgeon: Melina Schools, MD;  Location: [facility];  Service: Obstetrics;  Laterality: N/A;  . COLPOSCOPY    . DILATION AND CURETTAGE OF UTERUS    . INDUCED ABORTION  [id number]   x2  . Nexplanon insertion  08/08/2016    Current Outpatient Medications on File Prior to Visit  Medication Sig Dispense Refill  . aluminum chloride (DRYSOL) 20 % external solution APPLY EXTERNALLY TO THE AFFECTED AREA AT BEDTIME *must schedule physical* 35 mL 0  . etonogestrel (NEXPLANON) 68 MG IMPL implant 1 each by Subdermal route once.    Marland Kitchen ibuprofen (ADVIL) 200 MG tablet Take 800 mg by mouth as needed.    . naproxen (NAPROSYN) 375 MG tablet Take 1 tablet (375 mg total) by mouth 2 (two) times daily. 20 tablet 0   No current facility-administered medications on file prior to visit.   Allergies  Allergen Reactions  . Banana Itching  . Flagyl [Metronidazole] Hives and Itching    Vitals:   07/29/20 1103   BP: 120/76  Pulse: 76  Resp: 16   Physical Exam  B/P 120/76, Pulse 76, RR 16 Wt: 154  Procedure: Patient placed supine on exam table with her left arm flexed at the elbow. The prior insertion site was located and the Nexplanon rod was palpated.  Area cleansed with Betadine x 3 and draped in normal sterile fashion.  Insertion site and surrounding tissue anesthetized with 1% Lidocaine without epinephrine, 2cc total used.  Small incision made with #11 blade.  Nexplanon removed without difficulty.  Steri-strips were applied and pressure dressing placed over the site.  Entire procedure performed with sterile technique.  Pt tolerated procedure well.  Assessment: Nexplanon removal  Plan:  Post procedure instructions reviewed with pt.  Questions answered.  Pt knows to call with any concerns or questions.  New contraception:  Condoms until ready for next pregnancy Advised that fertility can return rapidly Has appointment in June 2022 for annual exam with Dr. Dellis Filbert.

## 2020-08-23 ENCOUNTER — Encounter: Payer: Self-pay | Admitting: Nurse Practitioner

## 2020-10-13 ENCOUNTER — Other Ambulatory Visit (HOSPITAL_COMMUNITY)
Admission: RE | Admit: 2020-10-13 | Discharge: 2020-10-13 | Disposition: A | Discharge status: Home / Self Care | Payer: 59 | Source: Ambulatory Visit | Attending: Obstetrics & Gynecology | Admitting: Obstetrics & Gynecology

## 2020-10-13 ENCOUNTER — Encounter: Payer: Self-pay | Admitting: Obstetrics & Gynecology

## 2020-10-13 ENCOUNTER — Other Ambulatory Visit: Payer: Self-pay

## 2020-10-13 ENCOUNTER — Ambulatory Visit (INDEPENDENT_AMBULATORY_CARE_PROVIDER_SITE_OTHER): Payer: 59 | Admitting: Obstetrics & Gynecology

## 2020-10-13 VITALS — BP 120/72 | Ht 64.0 in | Wt 154.0 lb

## 2020-10-13 DIAGNOSIS — Z789 Other specified health status: Secondary | ICD-10-CM | POA: Diagnosis not present

## 2020-10-13 DIAGNOSIS — Z01419 Encounter for gynecological examination (general) (routine) without abnormal findings: Secondary | ICD-10-CM | POA: Diagnosis not present

## 2020-10-13 DIAGNOSIS — Z3169 Encounter for other general counseling and advice on procreation: Secondary | ICD-10-CM

## 2020-10-13 NOTE — Progress Notes (Addendum)
[place] [birth year] [id number]   History:    35 y.o. [id number]  Boyfriend x 1 1/2 yr.  Son is 33 yo.  Daughter is 30 yo (I delivered her)   RP:  Established patient presenting for annual gyn exam   HPI:  Nexplanon removal 07/2020.  Using condoms.  Normal menstrual period in May and 10/13/2020.  No pelvic pain.  No pain with IC.  Full STI screen Neg in 06/2020.  Breasts normal.  BMI 26.43.    Past medical history,surgical history, family history and social history were all reviewed and documented in the EPIC chart.  Gynecologic History Patient's last menstrual period was 10/11/2020 (exact date).  Obstetric History OB History  Gravida Para Term Preterm AB Living  5 2 2   3 2   SAB IAB Ectopic Multiple Live Births  1 2     2     # Outcome Date GA Lbr Len/2nd Weight Sex Delivery Anes PTL Lv  5 Term 03/06/13 [redacted]w[redacted]d  8 lb 0.6 oz (3.645 kg) M CS-LTranv Spinal  LIV  4 SAB 06/07/10          3 IAB 2008          2 Term 2007 [redacted]w[redacted]d 12:00 7 lb 9 oz (3.43 kg) F Vag-Spont EPI  LIV  1 IAB              ROS: A ROS was performed and pertinent positives and negatives are included in the history.  GENERAL: No fevers or chills. HEENT: No change in vision, no earache, sore throat or sinus congestion. NECK: No pain or stiffness. CARDIOVASCULAR: No chest pain or pressure. No palpitations. PULMONARY: No shortness of breath, cough or wheeze. GASTROINTESTINAL: No abdominal pain, nausea, vomiting or diarrhea, melena or bright red blood per rectum. GENITOURINARY: No urinary frequency, urgency, hesitancy or dysuria. MUSCULOSKELETAL: No joint or muscle pain, no back pain, no recent trauma. DERMATOLOGIC: No rash, no itching, no lesions. ENDOCRINE: No polyuria, polydipsia, no heat or cold intolerance. No recent change in weight. HEMATOLOGICAL: No anemia or easy bruising or bleeding. NEUROLOGIC: No headache, seizures, numbness, tingling or weakness. PSYCHIATRIC: No depression, no loss of interest in normal activity  or change in sleep pattern.     Exam:   BP 120/72   Ht 5\' 4"  (1.626 m)   Wt 154 lb (69.9 kg)   LMP 10/11/2020 (Exact Date)   BMI 26.43 kg/m   Body mass index is 26.43 kg/m.  General appearance : Well developed well nourished female. No acute distress HEENT: Eyes: no retinal hemorrhage or exudates,  Neck supple, trachea midline, no carotid bruits, no thyroidmegaly Lungs: Clear to auscultation, no rhonchi or wheezes, or rib retractions  Heart: Regular rate and rhythm, no murmurs or gallops Breast:Examined in sitting and supine position were symmetrical in appearance, no palpable masses or tenderness,  no skin retraction, no nipple inversion, no nipple discharge, no skin discoloration, no axillary or supraclavicular lymphadenopathy Abdomen: no palpable masses or tenderness, no rebound or guarding Extremities: no edema or skin discoloration or tenderness  Pelvic: Vulva: Normal             Vagina: No gross lesions or discharge  Cervix: No gross lesions or discharge.  Pap reflex done.  Uterus  AV, normal size, shape and consistency, non-tender and mobile  Adnexa  Without masses or tenderness  Anus: Normal   Assessment/Plan:  35 y.o. female for annual exam   1. Encounter for routine gynecological examination  with Papanicolaou smear of cervix Normal gynecologic exam.  Pap reflex done.  Breast exam normal.  Body mass index at 26.43.  Continue with fitness and healthy nutrition. - Cytology - PAP( [facility])  2. Uses condoms for contraception  3. Encounter for preconception consultation  Removed Nexplanon in March 2022.  Would like to attempt conception in the near future.  Fertile periods in the cycle discussed.  Fitness and nutrition discussed.  Prenatal vitamins to start.   Princess Bruins MD, 1:49 PM 10/13/2020

## 2020-10-17 ENCOUNTER — Encounter: Payer: Self-pay | Admitting: Obstetrics & Gynecology

## 2020-10-18 ENCOUNTER — Ambulatory Visit (HOSPITAL_COMMUNITY)
Admission: EM | Admit: 2020-10-18 | Discharge: 2020-10-18 | Disposition: A | Discharge status: Home / Self Care | Payer: 59 | Attending: Family Medicine | Admitting: Family Medicine

## 2020-10-18 ENCOUNTER — Other Ambulatory Visit: Payer: Self-pay

## 2020-10-18 ENCOUNTER — Encounter (HOSPITAL_COMMUNITY): Payer: Self-pay

## 2020-10-18 DIAGNOSIS — W57XXXA Bitten or stung by nonvenomous insect and other nonvenomous arthropods, initial encounter: Secondary | ICD-10-CM

## 2020-10-18 DIAGNOSIS — S60562A Insect bite (nonvenomous) of left hand, initial encounter: Secondary | ICD-10-CM | POA: Diagnosis not present

## 2020-10-18 MED ORDER — TRIAMCINOLONE ACETONIDE 0.1 % EX CREA: 1.0000 "application " | TOPICAL_CREAM | Freq: Two times a day (BID) | CUTANEOUS | 0 refills | Status: DC

## 2020-10-18 NOTE — ED Triage Notes (Signed)
Pt in with c/o left ring finger that happened 3 days ago  States she thinks it's a spider bite and the site has hardened  Denies any drainage

## 2020-10-19 ENCOUNTER — Encounter (HOSPITAL_COMMUNITY): Payer: Self-pay

## 2020-10-19 ENCOUNTER — Emergency Department (HOSPITAL_COMMUNITY)
Admission: EM | Admit: 2020-10-19 | Discharge: 2020-10-19 | Disposition: A | Discharge status: Home / Self Care | Payer: 59 | Attending: Emergency Medicine | Admitting: Emergency Medicine

## 2020-10-19 ENCOUNTER — Other Ambulatory Visit: Payer: Self-pay

## 2020-10-19 DIAGNOSIS — L02512 Cutaneous abscess of left hand: Secondary | ICD-10-CM | POA: Insufficient documentation

## 2020-10-19 DIAGNOSIS — Z87891 Personal history of nicotine dependence: Secondary | ICD-10-CM | POA: Insufficient documentation

## 2020-10-19 DIAGNOSIS — M79645 Pain in left finger(s): Secondary | ICD-10-CM | POA: Diagnosis present

## 2020-10-19 MED ORDER — DOXYCYCLINE HYCLATE 100 MG PO TABS
100.0000 mg | ORAL_TABLET | Freq: Once | ORAL | Status: AC
  Administered 2020-10-19: 100 mg via ORAL
  Filled 2020-10-19: qty 1

## 2020-10-19 MED ORDER — DOXYCYCLINE HYCLATE 100 MG PO CAPS: 100.0000 mg | ORAL_CAPSULE | Freq: Two times a day (BID) | ORAL | 0 refills | Status: DC

## 2020-10-19 MED ORDER — OXYCODONE-ACETAMINOPHEN 5-325 MG PO TABS
1.0000 | ORAL_TABLET | Freq: Once | ORAL | Status: AC
  Administered 2020-10-19: 1 via ORAL
  Filled 2020-10-19: qty 1

## 2020-10-19 MED ORDER — LIDOCAINE HCL (PF) 1 % IJ SOLN
10.0000 mL | Freq: Once | INTRAMUSCULAR | Status: DC
  Filled 2020-10-19: qty 30

## 2020-10-19 NOTE — ED Provider Notes (Signed)
[facility] DEPT Provider Note   CSN: [id number] Arrival date & time: 10/19/20  4097     History Chief Complaint  Patient presents with   Insect Bite    Spider    [name] is a 35 y.o. female.  Patient presenting with pain, redness, and swelling in the distal portion of left ring finger. Denies injury or trauma. Feels similar to spider bite she has had previously but denies seeing a spider. Denies fever, chills, n/v/d. Went to UC for symptoms and they told her to ice and elevate which has not helped. Reports difficulty sleeping due to pain.       Past Medical History:  Diagnosis Date   ASCUS of cervix with negative high risk HPV 05/2016   Chicken pox    Elevated prolactin level    30 range 12/2008, normal X multiple repeats, most recent 18 04/2011   Fibroadenoma of breast    LEFT   IBS (irritable bowel syndrome)    LGSIL (low grade squamous intraepithelial dysplasia) 12/2008, 02/2011   C&B WITH LGSIL 02/2011   MRSA (methicillin resistant staph aureus) culture positive 04/2014   vulvar abscess   STD (sexually transmitted disease)    History of Chlamydia    Patient Active Problem List   Diagnosis Date Noted   URI with cough and congestion 05/13/2020   Finger joint swelling, right 07/30/2019   Excessive sweating 06/18/2014   IBS (irritable colon syndrome) 06/29/2011    Past Surgical History:  Procedure Laterality Date   ADENOIDECTOMY     ASA CHILD   CESAREAN SECTION N/A 03/06/2013   Procedure: CESAREAN SECTION;  Surgeon: Melina Schools, MD;  Location: [facility];  Service: Obstetrics;  Laterality: N/A;   COLPOSCOPY     DILATION AND CURETTAGE OF UTERUS     INDUCED ABORTION  2007,2008   x2   Nexplanon insertion  08/08/2016     OB History     Gravida  5   Para  2   Term  2   Preterm      AB  3   Living  2      SAB  1   IAB  2   Ectopic      Multiple      Live Births  2           Family History   Problem Relation Age of Onset   Other Brother        TB   Aneurysm Maternal Grandfather    Cancer Maternal Grandmother        Lymphoma   Alcohol abuse Neg Hx    Arthritis Neg Hx    Asthma Neg Hx    Birth defects Neg Hx    COPD Neg Hx    Depression Neg Hx    Diabetes Neg Hx    Drug abuse Neg Hx    Early death Neg Hx    Hearing loss Neg Hx    Heart disease Neg Hx    Hyperlipidemia Neg Hx    Hypertension Neg Hx    Kidney disease Neg Hx    Learning disabilities Neg Hx    Mental illness Neg Hx    Mental retardation Neg Hx    Miscarriages / Stillbirths Neg Hx    Vision loss Neg Hx    Varicose Veins Neg Hx     Social History   Tobacco Use   Smoking status: Former    Pack  years: 0.00    Types: Cigarettes    Quit date: 04/12/2010    Years since quitting: 10.5   Smokeless tobacco: Never  Vaping Use   Vaping Use: Never used  Substance Use Topics   Alcohol use: Yes    Comment: Occasional   Drug use: Never    Home Medications Prior to Admission medications   Medication Sig Start Date End Date Taking? Authorizing Provider  triamcinolone cream (KENALOG) 0.1 % Apply 1 application topically 2 (two) times daily. 10/18/20   Volney American, PA-C    Allergies    Banana and Flagyl [metronidazole]  Review of Systems   Review of Systems  Constitutional:  Negative for fever.  Gastrointestinal:  Negative for nausea and vomiting.  Skin:  Negative for color change.       Positive for abscess.  Hematological:  Negative for adenopathy.   Physical Exam Updated Vital Signs BP 116/90 (BP Location: Left Arm)   Pulse 80   Temp 98.1 F (36.7 C) (Oral)   Resp 15   Ht 5\' 4"  (1.626 m)   Wt 69.9 kg   LMP 10/11/2020 (Exact Date)   SpO2 100%   BMI 26.43 kg/m   Physical Exam Vitals and nursing note reviewed.  Constitutional:      Appearance: She is well-developed.  HENT:     Head: Normocephalic and atraumatic.  Eyes:     Conjunctiva/sclera: Conjunctivae normal.   Pulmonary:     Effort: No respiratory distress.  Musculoskeletal:     Cervical back: Normal range of motion and neck supple.  Skin:    General: Skin is warm and dry.     Comments: There is a less than 1 cm area of induration at the proximal portion of the left ring finger distal to the DIP joint.  Area is very tender.  She has mild tenderness over the flexor tendon, however no fusiform swelling.  I am able to extend the finger without much discomfort.  The finger is not held in a flexed position.  Neurological:     Mental Status: She is alert.    ED Results / Procedures / Treatments   Labs (all labs ordered are listed, but only abnormal results are displayed) Labs Reviewed - No data to display  EKG None  Radiology No results found.  Procedures .Marland KitchenIncision and Drainage  Date/Time: 10/19/2020 9:16 AM Performed by: Carlisle Cater, PA-C Authorized by: Carlisle Cater, PA-C   Consent:    Consent obtained:  Verbal   Consent given by:  Patient   Risks discussed:  Bleeding, pain, incomplete drainage, infection and damage to other organs   Alternatives discussed:  No treatment Universal protocol:    Patient identity confirmed:  Verbally with patient Location:    Type:  Abscess   Size:  73mm   Location:  Upper extremity   Upper extremity location:  Finger   Finger location:  L ring finger Pre-procedure details:    Skin preparation:  Povidone-iodine Sedation:    Sedation type:  None Anesthesia:    Anesthesia method:  Nerve block   Block location:  Base of left ring finger   Block needle gauge:  27 G   Block anesthetic:  Lidocaine 1% w/o epi   Block technique:  3-sided ring block   Block injection procedure:  Anatomic landmarks identified, introduced needle, incremental injection, negative aspiration for blood and anatomic landmarks palpated   Block outcome:  Anesthesia achieved Procedure type:    Complexity:  Simple  Procedure details:    Needle aspiration: yes     Needle  size:  18 G   Drainage:  Purulent   Drainage amount:  Scant   Packing materials:  None Post-procedure details:    Procedure completion:  Tolerated well, no immediate complications   Medications Ordered in ED Medications  lidocaine (PF) (XYLOCAINE) 1 % injection 10 mL (has no administration in time range)  doxycycline (VIBRA-TABS) tablet 100 mg (100 mg Oral Given 10/19/20 0811)  oxyCODONE-acetaminophen (PERCOCET/ROXICET) 5-325 MG per tablet 1 tablet (1 tablet Oral Given 10/19/20 1610)    ED Course  I have reviewed the triage vital signs and the nursing notes.  Pertinent labs & imaging results that were available during my care of the patient were reviewed by me and considered in my medical decision making (see chart for details).  Patient seen and examined. Work-up initiated.   Vital signs reviewed and are as follows: BP 116/90 (BP Location: Left Arm)   Pulse 80   Temp 98.1 F (36.7 C) (Oral)   Resp 15   Ht 5\' 4"  (1.626 m)   Wt 69.9 kg   LMP 10/11/2020 (Exact Date)   SpO2 100%   BMI 26.43 kg/m   EMERGENCY DEPARTMENT US SOFT TISSUE INTERPRETATION "Study: Limited Soft Tissue Ultrasound"  INDICATIONS: Pain Multiple views of the body part were obtained in real-time with a multi-frequency linear probe  PERFORMED BY: Myself IMAGES ARCHIVED?: Yes SIDE:Left BODY PART: protect  left index finger INTERPRETATION:  Abscess present and Cellulitis present  Discussed I&D or needle aspiration with patient after digital block.  Patient agrees to proceed.  9:13 AM I&D performed.  Patient counseled on signs of flexor tenosynovitis or worsening infection, need to return if these occur.  We will discharge home on Doxy.    Pt urged to return with worsening pain, worsening swelling, expanding area of redness or streaking up extremity, fever, or any other concerns. Urged to take complete course of antibiotics as prescribed. Pt verbalizes understanding and agrees with plan.     MDM  Rules/Calculators/A&P                          Patient presents to the emergency department for a small abscess in the pulp of the left ring finger.  The fluid collection is proximal.  This does not appear to be a full-blown felon.  No signs of flexor tenosynovitis at this time.  Needle aspiration performed with good results and patient started on oral antibiotics.  Strict return instructions as above.  Final Clinical Impression(s) / ED Diagnoses Final diagnoses:  Pulp abscess of finger of left hand    Rx / DC Orders ED Discharge Orders          Ordered    doxycycline (VIBRAMYCIN) 100 MG capsule  2 times daily        10/19/20 0911             Carlisle Cater, PA-C 10/20/20 2324    Gareth Morgan, MD 10/21/20 [phone]

## 2020-10-19 NOTE — ED Triage Notes (Signed)
Pt was bitten by a small spider on her ring finger 3 days ago. Swollen and painful to touch. Pt states that it was a brown small spider.

## 2020-10-19 NOTE — Discharge Instructions (Signed)
Please read and follow all provided instructions.  Your diagnoses today include:  1. Pulp abscess of finger of left hand     Tests performed today include: Vital signs. See below for your results today.   Medications prescribed:  Doxycycline - antibiotic  You have been prescribed an antibiotic medicine: take the entire course of medicine even if you are feeling better. Stopping early can cause the antibiotic not to work.  Take any prescribed medications only as directed.   Home care instructions:  Follow any educational materials contained in this packet  Follow-up instructions: Return to the Emergency Department in 48 hours for a recheck if your symptoms are not significantly improved.  Please follow-up with your primary care provider in the next 1 week for further evaluation of your symptoms.   Return instructions:  Return to the Emergency Department if you have: Fever Severe pain with movement of your finger, generalized swelling of your finger Worsening symptoms Worsening pain Worsening swelling Redness of the skin that moves away from the affected area, especially if it streaks away from the affected area  Any other emergent concerns  Your vital signs today were: BP 116/90 (BP Location: Left Arm)   Pulse 80   Temp 98.1 F (36.7 C) (Oral)   Resp 15   Ht 5\' 4"  (1.626 m)   Wt 69.9 kg   LMP 10/11/2020 (Exact Date)   SpO2 100%   BMI 26.43 kg/m  If your blood pressure (BP) was elevated above 135/85 this visit, please have this repeated by your doctor within one month. --------------

## 2020-10-19 NOTE — ED Provider Notes (Signed)
[facility]    CSN: [id number] Arrival date & time: 10/18/20  1954      History   Chief Complaint Chief Complaint  Patient presents with   Insect Bite    HPI MARIACLARA SPEAR is a 35 y.o. female.   Patient presenting today with painful papule to distal left ring finger that started 3 days ago.  She thinks that it is a spider bite though she did not see anything directly bite her.  The site is now hardening and becoming more painful.  She denies any drainage, redness extending down the finger, numbness, tingling, difficulty with range of motion, fever, chills, sweats.  Her main concern is she has had a MRSA infection with a spider bite on her arm in the past.  Has not been trying anything over-the-counter for symptoms thus far.   Past Medical History:  Diagnosis Date   ASCUS of cervix with negative high risk HPV 05/2016   Chicken pox    Elevated prolactin level    30 range 12/2008, normal X multiple repeats, most recent 18 04/2011   Fibroadenoma of breast    LEFT   IBS (irritable bowel syndrome)    LGSIL (low grade squamous intraepithelial dysplasia) 12/2008, 02/2011   C&B WITH LGSIL 02/2011   MRSA (methicillin resistant staph aureus) culture positive 04/2014   vulvar abscess   STD (sexually transmitted disease)    History of Chlamydia    Patient Active Problem List   Diagnosis Date Noted   URI with cough and congestion 05/13/2020   Finger joint swelling, right 07/30/2019   Excessive sweating 06/18/2014   IBS (irritable colon syndrome) 06/29/2011    Past Surgical History:  Procedure Laterality Date   ADENOIDECTOMY     ASA CHILD   CESAREAN SECTION N/A 03/06/2013   Procedure: CESAREAN SECTION;  Surgeon: Melina Schools, MD;  Location: [facility];  Service: Obstetrics;  Laterality: N/A;   COLPOSCOPY     DILATION AND CURETTAGE OF UTERUS     INDUCED ABORTION  2007,2008   x2   Nexplanon insertion  08/08/2016    OB History     Gravida  5   Para  2    Term  2   Preterm      AB  3   Living  2      SAB  1   IAB  2   Ectopic      Multiple      Live Births  2            Home Medications    Prior to Admission medications   Medication Sig Start Date End Date Taking? Authorizing Provider  triamcinolone cream (KENALOG) 0.1 % Apply 1 application topically 2 (two) times daily. 10/18/20  Yes Volney American, PA-C  doxycycline (VIBRAMYCIN) 100 MG capsule Take 1 capsule (100 mg total) by mouth 2 (two) times daily. 10/19/20   Carlisle Cater, PA-C    Family History Family History  Problem Relation Age of Onset   Other Brother        TB   Aneurysm Maternal Grandfather    Cancer Maternal Grandmother        Lymphoma   Alcohol abuse Neg Hx    Arthritis Neg Hx    Asthma Neg Hx    Birth defects Neg Hx    COPD Neg Hx    Depression Neg Hx    Diabetes Neg Hx    Drug abuse Neg Hx  Early death Neg Hx    Hearing loss Neg Hx    Heart disease Neg Hx    Hyperlipidemia Neg Hx    Hypertension Neg Hx    Kidney disease Neg Hx    Learning disabilities Neg Hx    Mental illness Neg Hx    Mental retardation Neg Hx    Miscarriages / Stillbirths Neg Hx    Vision loss Neg Hx    Varicose Veins Neg Hx     Social History Social History   Tobacco Use   Smoking status: Former    Pack years: 0.00    Types: Cigarettes    Quit date: 04/12/2010    Years since quitting: 10.5   Smokeless tobacco: Never  Vaping Use   Vaping Use: Never used  Substance Use Topics   Alcohol use: Yes    Comment: Occasional   Drug use: Never     Allergies   Banana and Flagyl [metronidazole]   Review of Systems Review of Systems Per HPI  Physical Exam Triage Vital Signs ED Triage Vitals  Enc Vitals Group     BP 10/18/20 2020 118/71     Pulse Rate 10/18/20 2020 66     Resp 10/18/20 2020 16     Temp 10/18/20 2020 99.4 F (37.4 C)     Temp Source 10/18/20 2020 Oral     SpO2 10/18/20 2020 100 %     Weight --      Height --      Head  Circumference --      Peak Flow --      Pain Score 10/18/20 2018 7     Pain Loc --      Pain Edu? --      Excl. in [place]? --    No data found.  Updated Vital Signs BP 118/71 (BP Location: Right Arm)   Pulse 66   Temp 99.4 F (37.4 C) (Oral)   Resp 16   LMP 10/11/2020 (Exact Date)   SpO2 100%   Visual Acuity Right Eye Distance:   Left Eye Distance:   Bilateral Distance:    Right Eye Near:   Left Eye Near:    Bilateral Near:     Physical Exam Vitals and nursing note reviewed.  Constitutional:      Appearance: Normal appearance. She is not ill-appearing.  HENT:     Head: Atraumatic.  Eyes:     Extraocular Movements: Extraocular movements intact.     Conjunctiva/sclera: Conjunctivae normal.  Cardiovascular:     Rate and Rhythm: Normal rate and regular rhythm.     Heart sounds: Normal heart sounds.  Pulmonary:     Effort: Pulmonary effort is normal.     Breath sounds: Normal breath sounds.  Musculoskeletal:        General: Normal range of motion.     Cervical back: Normal range of motion and neck supple.  Skin:    General: Skin is warm and dry.     Comments: 0.5 cm mildly erythematous firm papule to the distal left ring fingertip, tender to palpation, nonfluctuant, no erythema extending from papule, good range of motion all 5 fingers left hand.  Neurological:     Mental Status: She is alert and oriented to person, place, and time.     Comments: Left upper extremity neurovascularly intact  Psychiatric:        Mood and Affect: Mood normal.        Thought Content: Thought content  normal.        Judgment: Judgment normal.     UC Treatments / Results  Labs (all labs ordered are listed, but only abnormal results are displayed) Labs Reviewed - No data to display  EKG   Radiology No results found.  Procedures Procedures (including critical care time)  Medications Ordered in UC Medications - No data to display  Initial Impression / Assessment and Plan / UC  Course  I have reviewed the triage vital signs and the nursing notes.  Pertinent labs & imaging results that were available during my care of the patient were reviewed by me and considered in my medical decision making (see chart for details).     Patient is afebrile, no obvious evidence of abscess requiring intervention and range of motion is full and intact.  Suspect inflammation from possible insect bite with allergic localized response.  Trial triamcinolone cream, Epsom salt soaks, ice, elevation of the hand.  Discussed if symptoms worsening, including further edema, erythema, drainage, fever, chills, difficulty bending the finger return for further evaluation and management.  Final Clinical Impressions(s) / UC Diagnoses   Final diagnoses:  Insect bite of left hand, initial encounter   Discharge Instructions   None    ED Prescriptions     Medication Sig Dispense Auth. Provider   triamcinolone cream (KENALOG) 0.1 % Apply 1 application topically 2 (two) times daily. 30 g Volney American, [place]      PDMP not reviewed this encounter.   Volney American, [place] 10/19/20 1326

## 2020-10-21 ENCOUNTER — Other Ambulatory Visit: Payer: Self-pay

## 2020-10-21 DIAGNOSIS — R87610 Atypical squamous cells of undetermined significance on cytologic smear of cervix (ASC-US): Secondary | ICD-10-CM

## 2020-10-21 DIAGNOSIS — R87810 Cervical high risk human papillomavirus (HPV) DNA test positive: Secondary | ICD-10-CM

## 2020-10-22 LAB — CYTOLOGY - PAP
Comment: NEGATIVE
Comment: NEGATIVE
Diagnosis: UNDETERMINED — AB
HPV 16: NEGATIVE
HPV 18 / 45: NEGATIVE
High risk HPV: POSITIVE — AB

## 2020-10-27 ENCOUNTER — Ambulatory Visit (INDEPENDENT_AMBULATORY_CARE_PROVIDER_SITE_OTHER): Payer: 59 | Admitting: Obstetrics & Gynecology

## 2020-10-27 ENCOUNTER — Encounter: Payer: Self-pay | Admitting: Obstetrics & Gynecology

## 2020-10-27 ENCOUNTER — Other Ambulatory Visit (HOSPITAL_COMMUNITY)
Admission: RE | Admit: 2020-10-27 | Discharge: 2020-10-27 | Disposition: A | Discharge status: Home / Self Care | Payer: 59 | Source: Ambulatory Visit | Attending: Obstetrics & Gynecology | Admitting: Obstetrics & Gynecology

## 2020-10-27 ENCOUNTER — Other Ambulatory Visit: Payer: Self-pay

## 2020-10-27 VITALS — BP 110/72 | HR 74 | Resp 16

## 2020-10-27 DIAGNOSIS — R87610 Atypical squamous cells of undetermined significance on cytologic smear of cervix (ASC-US): Secondary | ICD-10-CM | POA: Diagnosis not present

## 2020-10-27 DIAGNOSIS — R87810 Cervical high risk human papillomavirus (HPV) DNA test positive: Secondary | ICD-10-CM | POA: Diagnosis not present

## 2020-10-27 DIAGNOSIS — N870 Mild cervical dysplasia: Secondary | ICD-10-CM

## 2020-10-27 DIAGNOSIS — Z01812 Encounter for preprocedural laboratory examination: Secondary | ICD-10-CM

## 2020-10-27 LAB — PREGNANCY, URINE: Preg Test, Ur: NEGATIVE

## 2020-10-27 NOTE — Progress Notes (Signed)
    Gilman December 29, 1985 [id number]        35 y.o.  [id number]   RP: ASCUS/HPV HR pos for Colposcopy  HPI: Pap ASCUS/HPV HR Pos.  HPV 16-18-45 Neg.   OB History  Gravida Para Term Preterm AB Living  5 2 2   3 2   SAB IAB Ectopic Multiple Live Births  1 2     2     # Outcome Date GA Lbr Len/2nd Weight Sex Delivery Anes PTL Lv  5 Term 03/06/13 [redacted]w[redacted]d  8 lb 0.6 oz (3.645 kg) M CS-LTranv Spinal  LIV  4 SAB 06/07/10          3 IAB 2008          2 Term 2007 [redacted]w[redacted]d 12:00 7 lb 9 oz (3.43 kg) F Vag-Spont EPI  LIV  1 IAB             Past medical history,surgical history, problem list, medications, allergies, family history and social history were all reviewed and documented in the EPIC chart.   Directed ROS with pertinent positives and negatives documented in the history of present illness/assessment and plan.  Exam:  Vitals:   10/27/20 1547  BP: 110/72  Pulse: 74  Resp: 16   General appearance:  Normal  Colposcopy Procedure Note [place] N Mcclain 10/27/2020  Indications:  ASCUS/HPV HR Pos/HPV 16-18-45 Neg  Procedure Details  The risks and benefits of the procedure and Written informed consent obtained.  Speculum placed in vagina and excellent visualization of cervix achieved, cervix swabbed x 3 with acetic acid solution.  Findings:  Cervix colposcopy: Physical Exam Genitourinary:       Vaginal colposcopy: Normal  Vulvar colposcopy: Normal  Perirectal colposcopy: Normal  The cervix was sprayed with Hurricane before performing the cervical biopsies.  Specimens: Cervical Bxs at 3 and 6 O'Clock  Complications: None, good hemostasis with Silver Nitrate . Plan: Management per Cervical Bx results   Assessment/Plan:  35 y.o. [id number]   1. ASCUS with positive high risk HPV cervical  - Colposcopy - Surgical pathology( [facility]/ [facility])  2. Encounter for preprocedural laboratory examination - Pregnancy, urine   Princess Bruins MD, 4:06 PM  10/27/2020

## 2020-10-29 ENCOUNTER — Encounter: Payer: Self-pay | Admitting: Obstetrics & Gynecology

## 2020-10-29 LAB — SURGICAL PATHOLOGY

## 2020-12-14 ENCOUNTER — Encounter: Payer: 59 | Admitting: Nurse Practitioner

## 2021-01-31 ENCOUNTER — Telehealth: Payer: Self-pay | Admitting: Obstetrics and Gynecology

## 2021-01-31 ENCOUNTER — Ambulatory Visit (INDEPENDENT_AMBULATORY_CARE_PROVIDER_SITE_OTHER): Payer: 59 | Admitting: Obstetrics and Gynecology

## 2021-01-31 ENCOUNTER — Other Ambulatory Visit: Payer: Self-pay

## 2021-01-31 ENCOUNTER — Encounter: Payer: Self-pay | Admitting: Obstetrics and Gynecology

## 2021-01-31 VITALS — BP 110/70 | HR 67 | Ht 64.0 in | Wt 157.0 lb

## 2021-01-31 DIAGNOSIS — N898 Other specified noninflammatory disorders of vagina: Secondary | ICD-10-CM

## 2021-01-31 DIAGNOSIS — Z3009 Encounter for other general counseling and advice on contraception: Secondary | ICD-10-CM | POA: Diagnosis not present

## 2021-01-31 DIAGNOSIS — Z113 Encounter for screening for infections with a predominantly sexual mode of transmission: Secondary | ICD-10-CM

## 2021-01-31 DIAGNOSIS — N760 Acute vaginitis: Secondary | ICD-10-CM | POA: Diagnosis not present

## 2021-01-31 DIAGNOSIS — B9689 Other specified bacterial agents as the cause of diseases classified elsewhere: Secondary | ICD-10-CM

## 2021-01-31 LAB — WET PREP FOR TRICH, YEAST, CLUE

## 2021-01-31 MED ORDER — CLINDAMYCIN HCL 300 MG PO CAPS: ORAL_CAPSULE | ORAL | 0 refills | Status: DC

## 2021-01-31 NOTE — Progress Notes (Signed)
GYNECOLOGY  VISIT   HPI: 35 y.o.   Single Black or African American Not Hispanic or Latino  female   [phone] with Patient's last menstrual period was 12/30/2020.   here for Nexplanon consult. She had a nexplanon removed in 4/22. She has been using condoms. Was considering conception.  She doesn't feel great off of the Nexplanon. Cycles have been every 31-33 days x 7-8 days. Saturating a pad every 3-4 hours. Cramps are bad. Condoms sometimes, not always and has decide she doesn't want to get pregnant.  She had been using the nexplon since her son was born 44 years ago. With the nexplanon, rare light cycle. Desires STD testing (only cervical cultures). She had some pelvic abdominal pain that has now resolved.   GYNECOLOGIC HISTORY: Patient's last menstrual period was 12/30/2020. Contraception:condoms Menopausal hormone therapy: none         OB History     Gravida  5   Para  2   Term  2   Preterm      AB  3   Living  2      SAB  1   IAB  2   Ectopic      Multiple      Live Births  2              Patient Active Problem List   Diagnosis Date Noted   URI with cough and congestion 05/13/2020   Finger joint swelling, right 07/30/2019   Excessive sweating 06/18/2014   IBS (irritable colon syndrome) 06/29/2011    Past Medical History:  Diagnosis Date   ASCUS of cervix with negative high risk HPV 05/2016   Chicken pox    Elevated prolactin level    30 range 12/2008, normal X multiple repeats, most recent 18 04/2011   Fibroadenoma of breast    LEFT   IBS (irritable bowel syndrome)    LGSIL (low grade squamous intraepithelial dysplasia) 12/2008, 02/2011   C&B WITH LGSIL 02/2011   MRSA (methicillin resistant staph aureus) culture positive 04/2014   vulvar abscess   STD (sexually transmitted disease)    History of Chlamydia    Past Surgical History:  Procedure Laterality Date   ADENOIDECTOMY     ASA CHILD   CESAREAN SECTION N/A 03/06/2013   Procedure:  CESAREAN SECTION;  Surgeon: Melina Schools, MD;  Location: [facility];  Service: Obstetrics;  Laterality: N/A;   COLPOSCOPY     DILATION AND CURETTAGE OF UTERUS     INDUCED ABORTION  9798,9211   x2   Nexplanon insertion  08/08/2016    No current outpatient medications on file.   No current facility-administered medications for this visit.     ALLERGIES: Banana and Flagyl [metronidazole]  Family History  Problem Relation Age of Onset   Other Brother        TB   Aneurysm Maternal Grandfather    Cancer Maternal Grandmother        Lymphoma   Alcohol abuse Neg Hx    Arthritis Neg Hx    Asthma Neg Hx    Birth defects Neg Hx    COPD Neg Hx    Depression Neg Hx    Diabetes Neg Hx    Drug abuse Neg Hx    Early death Neg Hx    Hearing loss Neg Hx    Heart disease Neg Hx    Hyperlipidemia Neg Hx    Hypertension Neg Hx    Kidney disease  Neg Hx    Learning disabilities Neg Hx    Mental illness Neg Hx    Mental retardation Neg Hx    Miscarriages / Stillbirths Neg Hx    Vision loss Neg Hx    Varicose Veins Neg Hx     Social History   Socioeconomic History   Marital status: Single    Spouse name: Not on file   Number of children: 1   Years of education: Not on file   Highest education level: Not on file  Occupational History   Occupation: Therapist, 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   Smoking status: Former    Types: Cigarettes    Quit date: 04/12/2010    Years since quitting: 10.8   Smokeless tobacco: Never  Vaping Use   Vaping Use: Never used  Substance and Sexual Activity   Alcohol use: Yes    Comment: Occasional   Drug use: Never   Sexual activity: Yes    Partners: Male    Birth control/protection: None  Other Topics Concern   Not on file  Social History Narrative   Not on file   Social Determinants of Health   Financial Resource Strain: Not on file  Food Insecurity: Not on file  Transportation Needs: Not on file  Physical Activity: Not on  file  Stress: Not on file  Social Connections: Not on file  Intimate Partner Violence: Not on file    Review of Systems  All other systems reviewed and are negative.  PHYSICAL EXAMINATION:    BP 110/70   Pulse 67   Ht 5\' 4"  (1.626 m)   Wt 157 lb (71.2 kg)   LMP 12/30/2020   SpO2 100%   BMI 26.95 kg/m     General appearance: alert, cooperative and appears stated age   Pelvic: External genitalia:  no lesions              Urethra:  normal appearing urethra with no masses, tenderness or lesions              Bartholins and Skenes: normal                 Vagina: normal appearing vagina with an increase in watery, frothy, white vaginal discharge              Cervix: no cervical motion tenderness and no lesions              Bimanual Exam:  Uterus:  normal size, contour, position, consistency, mobility, non-tender and anteverted              Adnexa: no mass, fullness, tenderness                Chaperone was present for exam.  1. General counseling and advice on female contraception She will return on her cycle for insertion of a nexplanon.  - Insertion of implanon rod; Future  2. Screening examination for STD (sexually transmitted disease) Declines blood work - SURESWAB CT/NG/T. vaginalis  3. Vaginal discharge - WET PREP FOR TRICH, YEAST, CLUE: +clue  4. Bacterial vaginosis Allergy to flagyl - clindamycin (CLEOCIN) 300 MG capsule; Take one capsule bid x 7D  Dispense: 14 capsule; Refill: 0

## 2021-01-31 NOTE — Telephone Encounter (Signed)
Mychart message sent.

## 2021-01-31 NOTE — Patient Instructions (Signed)
Bacterial Vaginosis °Bacterial vaginosis is an infection that occurs when the normal balance of bacteria in the vagina changes. This change is caused by an overgrowth of certain bacteria in the vagina. Bacterial vaginosis is the most common vaginal infection among females aged 33 to 44 years. °This condition increases the risk of sexually transmitted infections (STIs). Treatment can help reduce this risk. Treatment is very important for pregnant women because this condition can cause babies to be born early (prematurely) or at a low birth weight. °What are the causes? °This condition is caused by an increase in harmful bacteria that are normally present in small amounts in the vagina. However, the exact reason this condition develops is not known. °You cannot get bacterial vaginosis from toilet seats, bedding, swimming pools, or contact with objects around you. °What increases the risk? °The following factors may make you more likely to develop this condition: °Having a new sexual partner or multiple sexual partners, or having unprotected sex. °Douching. °Having an intrauterine device (IUD). °Smoking. °Abusing drugs and alcohol. This may lead to riskier sexual behavior. °Taking certain antibiotic medicines. °Being pregnant. °What are the signs or symptoms? °Some women with this condition have no symptoms. Symptoms may include: °Gray or white vaginal discharge. The discharge can be watery or foamy. °A fish-like odor with discharge, especially after sex or during menstruation. °Itching in and around the vagina. °Burning or pain with urination. °How is this diagnosed? °This condition is diagnosed based on: °Your medical history. °A physical exam of the vagina. °Checking a sample of vaginal fluid for harmful bacteria or abnormal cells. °How is this treated? °This condition is treated with antibiotic medicines. These may be given as a pill, a vaginal cream, or a medicine that is put into the vagina (suppository). If the  condition comes back after treatment, a second round of antibiotics may be needed. °Follow these instructions at home: °Medicines °Take or apply over-the-counter and prescription medicines only as told by your health care provider. °Take or apply your antibiotic medicine as told by your health care provider. Do not stop using the antibiotic even if you start to feel better. °General instructions °If you have a female sexual partner, tell her that you have a vaginal infection. She should follow up with her health care provider. If you have a female sexual partner, he does not need treatment. °Avoid sexual activity until you finish treatment. °Drink enough fluid to keep your urine pale yellow. °Keep the area around your vagina and rectum clean. °Wash the area daily with warm water. °Wipe yourself from front to back after using the toilet. °If you are breastfeeding, talk to your health care provider about continuing breastfeeding during treatment. °Keep all follow-up visits. This is important. °How is this prevented? °Self-care °Do not douche. °Wash the outside of your vagina with warm water only. °Wear cotton or cotton-lined underwear. °Avoid wearing tight pants and pantyhose, especially during the summer. °Safe sex °Use protection when having sex. This includes: °Using condoms. °Using dental dams. This is a thin layer of a material made of latex or polyurethane that protects the mouth during oral sex. °Limit the number of sexual partners. To help prevent bacterial vaginosis, it is best to have sex with just one partner (monogamous relationship). °Make sure you and your sexual partner are tested for STIs. °Drugs and alcohol °Do not use any products that contain nicotine or tobacco. These products include cigarettes, chewing tobacco, and vaping devices, such as e-cigarettes. If you need help quitting,   ask your health care provider. °Do not use drugs. °Do not drink alcohol if: °Your health care provider tells you not to  do this. °You are pregnant, may be pregnant, or are planning to become pregnant. °If you drink alcohol: °Limit how much you have to 0-1 drink a day. °Be aware of how much alcohol is in your drink. In the [place] one drink equals one 12 oz bottle of beer (355 mL), one 5 oz glass of wine (148 mL), or one 1½ oz glass of hard liquor (44 mL). °Where to find more information °Centers for Disease Control and Prevention: www.cdc.gov °American Sexual Health Association (ASHA): www.ashastd.org °[place] Department of Health and Human Services, Office on Women's Health: www.womenshealth.gov °Contact a health care provider if: °Your symptoms do not improve, even after treatment. °You have more discharge or pain when urinating. °You have a fever or chills. °You have pain in your abdomen or pelvis. °You have pain during sex. °You have vaginal bleeding between menstrual periods. °Summary °Bacterial vaginosis is a vaginal infection that occurs when the normal balance of bacteria in the vagina changes. It results from an overgrowth of certain bacteria. °This condition increases the risk of sexually transmitted infections (STIs). Getting treated can help reduce this risk. °Treatment is very important for pregnant women because this condition can cause babies to be born early (prematurely) or at low birth weight. °This condition is treated with antibiotic medicines. These may be given as a pill, a vaginal cream, or a medicine that is put into the vagina (suppository). °This information is not intended to replace advice given to you by your health care provider. Make sure you discuss any questions you have with your health care provider. °Document Revised: 10/09/2019 Document Reviewed: 10/09/2019 °Elsevier Patient Education © 2022 Else[employer] Inc. ° °

## 2021-02-01 ENCOUNTER — Encounter: Payer: Self-pay | Admitting: Obstetrics and Gynecology

## 2021-02-01 ENCOUNTER — Ambulatory Visit (INDEPENDENT_AMBULATORY_CARE_PROVIDER_SITE_OTHER): Payer: 59 | Admitting: Obstetrics and Gynecology

## 2021-02-01 ENCOUNTER — Other Ambulatory Visit: Payer: Self-pay | Admitting: Obstetrics and Gynecology

## 2021-02-01 VITALS — BP 110/64 | HR 76 | Ht 64.0 in | Wt 157.0 lb

## 2021-02-01 DIAGNOSIS — Z113 Encounter for screening for infections with a predominantly sexual mode of transmission: Secondary | ICD-10-CM

## 2021-02-01 DIAGNOSIS — Z30017 Encounter for initial prescription of implantable subdermal contraceptive: Secondary | ICD-10-CM | POA: Diagnosis not present

## 2021-02-01 DIAGNOSIS — Z01812 Encounter for preprocedural laboratory examination: Secondary | ICD-10-CM

## 2021-02-01 LAB — SURESWAB CT/NG/T. VAGINALIS
C. trachomatis RNA, TMA: DETECTED — AB
N. gonorrhoeae RNA, TMA: NOT DETECTED
Trichomonas vaginalis RNA: NOT DETECTED

## 2021-02-01 LAB — PREGNANCY, URINE: Preg Test, Ur: NEGATIVE

## 2021-02-01 NOTE — Progress Notes (Signed)
GYNECOLOGY  VISIT   HPI: 35 y.o.   Single Black or African American Not Hispanic or Latino  female   [phone] with Patient's last menstrual period was 12/30/2020.   here for Nexplanon insertion. She hasn't been sexually active for over 2 weeks.   GYNECOLOGIC HISTORY: Patient's last menstrual period was 12/30/2020. Contraception:Condoms  Menopausal hormone therapy: none         OB History     Gravida  5   Para  2   Term  2   Preterm      AB  3   Living  2      SAB  1   IAB  2   Ectopic      Multiple      Live Births  2              Patient Active Problem List   Diagnosis Date Noted   URI with cough and congestion 05/13/2020   Finger joint swelling, right 07/30/2019   Excessive sweating 06/18/2014   IBS (irritable colon syndrome) 06/29/2011    Past Medical History:  Diagnosis Date   ASCUS of cervix with negative high risk HPV 05/2016   Chicken pox    Elevated prolactin level    30 range 12/2008, normal X multiple repeats, most recent 18 04/2011   Fibroadenoma of breast    LEFT   IBS (irritable bowel syndrome)    LGSIL (low grade squamous intraepithelial dysplasia) 12/2008, 02/2011   C&B WITH LGSIL 02/2011   MRSA (methicillin resistant staph aureus) culture positive 04/2014   vulvar abscess   STD (sexually transmitted disease)    History of Chlamydia    Past Surgical History:  Procedure Laterality Date   ADENOIDECTOMY     ASA CHILD   CESAREAN SECTION N/A 03/06/2013   Procedure: CESAREAN SECTION;  Surgeon: Melina Schools, MD;  Location: [facility];  Service: Obstetrics;  Laterality: N/A;   COLPOSCOPY     DILATION AND CURETTAGE OF UTERUS     INDUCED ABORTION  2007,2008   x2   Nexplanon insertion  08/08/2016    Current Outpatient Medications  Medication Sig Dispense Refill   clindamycin (CLEOCIN) 300 MG capsule Take one capsule bid x 7D 14 capsule 0   No current facility-administered medications for this visit.     ALLERGIES: Banana and  Flagyl [metronidazole]  Family History  Problem Relation Age of Onset   Other Brother        TB   Aneurysm Maternal Grandfather    Cancer Maternal Grandmother        Lymphoma   Alcohol abuse Neg Hx    Arthritis Neg Hx    Asthma Neg Hx    Birth defects Neg Hx    COPD Neg Hx    Depression Neg Hx    Diabetes Neg Hx    Drug abuse Neg Hx    Early death Neg Hx    Hearing loss Neg Hx    Heart disease Neg Hx    Hyperlipidemia Neg Hx    Hypertension Neg Hx    Kidney disease Neg Hx    Learning disabilities Neg Hx    Mental illness Neg Hx    Mental retardation Neg Hx    Miscarriages / Stillbirths Neg Hx    Vision loss Neg Hx    Varicose Veins Neg Hx     Social History   Socioeconomic History   Marital status: Single    Spouse  name: Not on file   Number of children: 1   Years of education: Not on file   Highest education level: Not on file  Occupational History   Occupation: Therapist, 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[place]   Smoking status: Former    Types: Cigarettes    Quit date: 04/12/2010    Years since quitting: 10.8   Smokeless tobacco: Never  Vaping Use   Vaping Use: Never used  Substance and Sexual Activity   Alcohol use: Yes    Comment: Occasional   Drug use: Never   Sexual activity: Yes    Partners: Male    Birth control/protection: None  Other Topics Concern   Not on file  Social History Narrative   Not on file   Social Determinants of Health   Financial Resource Strain: Not on file  Food Insecurity: Not on file  Transportation Needs: Not on file  Physical Activity: Not on file  Stress: Not on file  Social Connections: Not on file  Intimate Partner Violence: Not on file    Review of Systems  All other systems reviewed and are negative.  PHYSICAL EXAMINATION:    BP 110/64   Pulse 76   Ht 5\' 4"  (1.626 m)   Wt 157 lb (71.2 kg)   LMP 12/30/2020   SpO2 99%   BMI 26.95 kg/m     General appearance: alert, cooperative and appears  stated age  Risks of nexplanon insertion were reviewed with the patient and a consent was signed.  The patient was placed in the supine position with her left arm bent at the elbow. The skin was marked at 7 and 10 cm from the medial epicondyle and 3 cm posterior to the sulcus between the muscles. The area was cleansed with Hibiclens and local anesthesia was placed along the path where the nexplanon would be placed. The nexplanon device was inserted in the usual fashion without difficulty. Slight oozing from the insertion site was stopped with pressure. The device was palpated in place.  The patients arm was cleansed of betadine and a steri strip was placed over the incision. A gauze was wrapped around her arm.  She tolerated the procedure well  Instructions for care were discussed.    Chaperone was present for exam.  1. Nexplanon insertion - Insertion of implanon rod -Use back up contraception for one week  2. Pre-procedure lab exam - Pregnancy, urine:negative

## 2021-02-01 NOTE — Patient Instructions (Addendum)
Nexplanon Instructions After Insertion  Keep bandage clean and dry for 24 hours  May use ice/Tylenol/Ibuprofen for soreness or pain  If you develop fever, drainage or increased warmth from incision site-contact office immediately Use back up contraception for one week

## 2021-02-02 ENCOUNTER — Other Ambulatory Visit: Payer: 59

## 2021-02-02 ENCOUNTER — Other Ambulatory Visit: Payer: Self-pay

## 2021-02-02 DIAGNOSIS — Z113 Encounter for screening for infections with a predominantly sexual mode of transmission: Secondary | ICD-10-CM

## 2021-02-02 MED ORDER — DOXYCYCLINE HYCLATE 100 MG PO TBEC: 100.0000 mg | DELAYED_RELEASE_TABLET | Freq: Two times a day (BID) | ORAL | 0 refills | Status: DC

## 2021-02-03 ENCOUNTER — Telehealth: Payer: Self-pay

## 2021-02-03 LAB — RPR: RPR Ser Ql: NONREACTIVE

## 2021-02-03 LAB — HIV ANTIBODY (ROUTINE TESTING W REFLEX): HIV 1&2 Ab, 4th Generation: NONREACTIVE

## 2021-02-03 LAB — HEPATITIS C ANTIBODY
Hepatitis C Ab: NONREACTIVE
SIGNAL TO CUT-OFF: 0.06 (ref <1.00)

## 2021-02-03 MED ORDER — DOXYCYCLINE HYCLATE 100 MG PO TABS: 100.0000 mg | ORAL_TABLET | Freq: Two times a day (BID) | ORAL | 0 refills | Status: DC

## 2021-02-03 NOTE — Telephone Encounter (Signed)
I spoke with patient advocate at [facility]. Her thought was that the Rx sent yesterday was "slow release" (I had no idea).  She said she thought that was the reason for the PA being required. Therefore, I resent the Rx with generic Doxycycline (Vibra-tabs) 100 mg in hopes that will go through at the pharmacy without needing PA. I called the pharmacy to ask them to run it through and see. They have not yet received the electronic Rx I sent 20 mins ago. Asked me to call back in one hour and I will.

## 2021-02-03 NOTE — Telephone Encounter (Signed)
[facility] contacted [place]. Doxycycline 100 mg not covered by patients insurance and requires Prior Authorization. I called [facility] [phone]) and spoke with Caryl Pina. PA started but she said it will take 72 hours to process.  Case #  [id number]  Just letting Dr. Talbert Nan know in the event she would like to prescribe different Rx.

## 2021-02-03 NOTE — Telephone Encounter (Signed)
See if the doxycyline 100 mg capsules are covered.  Doxycycline is the preferred treatment for chlamydia. If we can't get her doxycyline by tomorrow, then I would treat with azithromycin 1 gram po x 1 dose. She would still need to abstain from intercourse for a week.

## 2021-02-04 NOTE — Telephone Encounter (Signed)
OK to finish Clindamycin first, but then I would have her immediately start the Doxycycline.

## 2021-02-04 NOTE — Telephone Encounter (Signed)
Spoke with patient. Advised per Dr. Silva. Patient verbalizes understanding and is agreeable.   Encounter closed.  

## 2021-02-04 NOTE — Telephone Encounter (Signed)
Call placed to pharmacy.  Spoke with SaToya. Confirmed no PA required for Doxycycline Rx. Rx ready to be picked up.  Call placed to patient, advised as seen above.   Patient currently taking clindamycin 300 mg cap bid x7 days, started on 01/31/21 for BV. Patient asking if she can complete Clindamycin first before starting Doxy for Chlamydia?   Advised I will review with covering provider and return call.   Dr. Quincy Simmonds -please advise.

## 2021-03-25 ENCOUNTER — Encounter: Payer: Self-pay | Admitting: Obstetrics and Gynecology

## 2021-03-25 ENCOUNTER — Ambulatory Visit (INDEPENDENT_AMBULATORY_CARE_PROVIDER_SITE_OTHER): Payer: 59 | Admitting: Obstetrics and Gynecology

## 2021-03-25 ENCOUNTER — Other Ambulatory Visit: Payer: Self-pay

## 2021-03-25 VITALS — BP 120/66

## 2021-03-25 DIAGNOSIS — Z8619 Personal history of other infectious and parasitic diseases: Secondary | ICD-10-CM

## 2021-03-25 DIAGNOSIS — N760 Acute vaginitis: Secondary | ICD-10-CM

## 2021-03-25 DIAGNOSIS — Z113 Encounter for screening for infections with a predominantly sexual mode of transmission: Secondary | ICD-10-CM | POA: Diagnosis not present

## 2021-03-25 DIAGNOSIS — B9689 Other specified bacterial agents as the cause of diseases classified elsewhere: Secondary | ICD-10-CM | POA: Diagnosis not present

## 2021-03-25 LAB — WET PREP FOR TRICH, YEAST, CLUE

## 2021-03-25 MED ORDER — CLINDAMYCIN PHOSPHATE 2 % VA CREA: 1.0000 | TOPICAL_CREAM | Freq: Every day | VAGINAL | 0 refills | Status: DC

## 2021-03-25 NOTE — Progress Notes (Signed)
GYNECOLOGY  VISIT   HPI: 35 y.o.   Single Black or African American Not Hispanic or Latino  female   [phone] with No LMP recorded.   here for  vaginal itch  that started yesterday, she also noted a vaginal odor. No discharge.  She was treated for chlamydia in 10/22  GYNECOLOGIC HISTORY: No LMP recorded. Contraception:Nexplanon Menopausal hormone therapy: N/A        OB History     Gravida  5   Para  2   Term  2   Preterm      AB  3   Living  2      SAB  1   IAB  2   Ectopic      Multiple      Live Births  2              Patient Active Problem List   Diagnosis Date Noted   URI with cough and congestion 05/13/2020   Finger joint swelling, right 07/30/2019   Excessive sweating 06/18/2014   IBS (irritable colon syndrome) 06/29/2011    Past Medical History:  Diagnosis Date   ASCUS of cervix with negative high risk HPV 05/2016   Chicken pox    Elevated prolactin level    30 range 12/2008, normal X multiple repeats, most recent 18 04/2011   Fibroadenoma of breast    LEFT   IBS (irritable bowel syndrome)    LGSIL (low grade squamous intraepithelial dysplasia) 12/2008, 02/2011   C&B WITH LGSIL 02/2011   MRSA (methicillin resistant staph aureus) culture positive 04/2014   vulvar abscess   STD (sexually transmitted disease)    History of Chlamydia    Past Surgical History:  Procedure Laterality Date   ADENOIDECTOMY     ASA CHILD   CESAREAN SECTION N/A 03/06/2013   Procedure: CESAREAN SECTION;  Surgeon: Melina Schools, MD;  Location: [facility];  Service: Obstetrics;  Laterality: N/A;   COLPOSCOPY     DILATION AND CURETTAGE OF UTERUS     INDUCED ABORTION  2007,2008   x2   Nexplanon insertion  08/08/2016    Current Outpatient Medications  Medication Sig Dispense Refill   clindamycin (CLEOCIN) 300 MG capsule Take one capsule bid x 7D 14 capsule 0   doxycycline (VIBRA-TABS) 100 MG tablet Take 1 tablet (100 mg total) by mouth 2 (two) times daily. 14  tablet 0   No current facility-administered medications for this visit.     ALLERGIES: Banana and Flagyl [metronidazole]  Family History  Problem Relation Age of Onset   Other Brother        TB   Aneurysm Maternal Grandfather    Cancer Maternal Grandmother        Lymphoma   Alcohol abuse Neg Hx    Arthritis Neg Hx    Asthma Neg Hx    Birth defects Neg Hx    COPD Neg Hx    Depression Neg Hx    Diabetes Neg Hx    Drug abuse Neg Hx    Early death Neg Hx    Hearing loss Neg Hx    Heart disease Neg Hx    Hyperlipidemia Neg Hx    Hypertension Neg Hx    Kidney disease Neg Hx    Learning disabilities Neg Hx    Mental illness Neg Hx    Mental retardation Neg Hx    Miscarriages / Stillbirths Neg Hx    Vision loss Neg Hx  Varicose Veins Neg Hx     Social History   Socioeconomic History   Marital status: Single    Spouse name: Not on file   Number of children: 1   Years of education: Not on file   Highest education level: Not on file  Occupational History   Occupation: Therapist, 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[place]   Smoking status: Former    Types: Cigarettes    Quit date: 04/12/2010    Years since quitting: 10.9   Smokeless tobacco: Never  Vaping Use   Vaping Use: Never used  Substance and Sexual Activity   Alcohol use: Yes    Comment: Occasional   Drug use: Never   Sexual activity: Yes    Partners: Male    Birth control/protection: None  Other Topics Concern   Not on file  Social History Narrative   Not on file   Social Determinants of Health   Financial Resource Strain: Not on file  Food Insecurity: Not on file  Transportation Needs: Not on file  Physical Activity: Not on file  Stress: Not on file  Social Connections: Not on file  Intimate Partner Violence: Not on file    ROS  PHYSICAL EXAMINATION:    There were no vitals taken for this visit.    General appearance: alert, cooperative and appears stated age  Pelvic: External  genitalia:  no lesions              Urethra:  normal appearing urethra with no masses, tenderness or lesions              Bartholins and Skenes: normal                 Vagina: normal appearing vagina with a slight increase in watery, white vaginal d/c              Cervix: no lesions               Chaperone was present for exam.  1. Acute vaginitis - WET PREP FOR TRICH, YEAST, CLUE: +BV  2. Bacterial vaginosis Allergy to flagyl - clindamycin (CLEOCIN) 2 % vaginal cream; Place 1 Applicatorful vaginally at bedtime.  Dispense: 40 g; Refill: 0  3. History of chlamydia Treated, due for retesting  4. Screening examination for STD (sexually transmitted disease) Declines other std testing - SURESWAB CT/NG/T. vaginalis

## 2021-03-25 NOTE — Patient Instructions (Signed)
Chlamydia, Female Chlamydia is an STI (sexually transmitted infection) that is caused by bacteria. This infection spreads through sexual contact. Chlamydia can occur in different areas of the body, including: The urethra. This is the part of the body that drains urine from the bladder. The cervix. This is the lowest part of the uterus. The throat. The rectum. This condition is not difficult to treat. However, if left untreated, chlamydia can lead to more serious health problems, including pelvic inflammatory disease (PID). PID can increase your risk of being unable to have children. Also, women with untreated chlamydia who are pregnant or become pregnant can spread the infection to their babies during delivery. This may cause serious health problems for their babies. What are the causes? This condition is caused by the bacteria Chlamydia trachomatis. The bacteria are spread from an infected partner during sexual activity. Chlamydia can spread through contact with the genitals, mouth, or rectum. What increases the risk? The following factors may make you more likely to develop this condition: Not using a condom the right way or not using a condom every time you have sex. Having a new sex partner or having more than one sex partner. Being female, age 19-25, and sexually active. What are the signs or symptoms? In some cases, there are no symptoms, especially early in the infection. Having no symptoms is also called being asymptomatic. If symptoms develop, they may include: Urinating often, or a burning feeling during urination. Discharge from the vagina. Redness, soreness, or swelling of the rectum, or bleeding or discharge coming from the rectum. Any of these may occur if the infection was spread through anal sex. Pain in the abdomen. Pain during sex. Bleeding between menstrual periods or irregular periods. Itching, burning, or redness in the eyes, or discharge from the eyes. How is this  diagnosed? This condition may be diagnosed with: Urine tests. Swab tests. Depending on your symptoms, your health care provider may use a cotton swab to collect discharge from your vagina or rectum to test for the bacteria. A pelvic exam. How is this treated? This condition is treated with antibiotic medicines. If you are pregnant, you will need to avoid certain types of antibiotics. Follow these instructions at home: Sexual activity Tell your sex partner or partners about your infection. These include any partners for oral, anal, or vaginal sex that you have had within 60 days of when your symptoms started. Sex partners should also be treated, even if they have no signs of the disease. Do not have sex until you and your sex partners have completed treatment and your health care provider says it is okay. If your health care provider prescribed you a single-dose medicine as treatment, wait at least 7 days after taking the medicine before having sex. General instructions Take over-the-counter and prescription medicines as told by your health care provider. Finish all antibiotic medicine even when you start to feel better. It is up to you to get your test results. Ask your health care provider, or the department that is doing the test, when your results will be ready. Get plenty of rest. Eat a healthy, well-balanced diet. Drink enough fluids to keep your urine pale yellow. Keep all follow-up visits as told by your health care provider. This is important. You may need to be tested for infection again 3 months after treatment. How is this prevented? The only sure way to prevent chlamydia is to avoid having vaginal, anal, and oral sex. However, you can lower your risk  by: Using latex condoms correctly every time you have sex. Not having multiple sex partners. Asking if your sex partner has been tested for STIs and had negative results. Getting regular health screenings to check for STIs. Contact a  health care provider if: You develop new symptoms or your symptoms do not get better after you complete treatment. You have a fever or chills. You have pain during sex. You develop new joint pain or swelling near your joints. You have irregular menstrual periods, or you have bleeding between periods or after sex. You develop flu-like symptoms, such as night sweats, sore throat, or muscle aches. You are pregnant and you develop symptoms of chlamydia. Get help right away if: Your pain gets worse and does not get better with medicine. You have pain in your abdomen or lower back that does not get better with medicine. You feel weak or dizzy, or you faint. Summary Chlamydia is an STI (sexually transmitted infection) that is caused by bacteria. This infection spreads through sexual contact. This condition is not difficult to treat. However, if left untreated, chlamydia can lead to more serious health problems, including pelvic inflammatory disease (PID). Some people have no symptoms (are asymptomatic), especially early in the infection. This condition is treated with antibiotic medicines. Using latex condoms correctly every time you have sex can help prevent chlamydia. This information is not intended to replace advice given to you by your health care provider. Make sure you discuss any questions you have with your health care provider. Document Revised: 04/07/2019 Document Reviewed: 04/07/2019 Elsevier Patient Education  2022 [place]. Vaginitis Vaginitis is a condition in which the vaginal tissue swells and becomes irritated. This condition is most often caused by a change in the normal balance of bacteria and yeast that live in the vagina. This change causes an overgrowth of certain bacteria or yeast, which causes the inflammation. There are different types of vaginitis. What are the causes? The cause of this condition depends on the type of vaginitis. It can be caused by: Bacteria  (bacterial vaginosis). Yeast, which is a fungus (candidiasis). A parasite (trichomoniasis vaginitis). A virus (viral vaginitis). Low hormone levels (atrophic vaginitis). Low hormone levels can occur during pregnancy, breastfeeding, or after menopause. Irritants, such as bubble baths, scented tampons, and feminine sprays (allergic vaginitis). Other factors can change the normal balance of the yeast and bacteria that live in the vagina. These include: Antibiotic medicines. Poor hygiene. Diaphragms, vaginal sponges, spermicides, birth control pills, and intrauterine devices (IUDs). Sex. Infection. Uncontrolled diabetes. A weakened body defense system (immune system). What increases the risk? This condition is more likely to develop in women who: Smoke or are exposed to secondhand smoke. Use vaginal douches, scented tampons, or scented sanitary pads. Wear tight-fitting pants or thong underwear. Use oral birth control pills or an IUD. Have sex without a condom or have multiple partners. Have an STI. Frequently use the spermicide nonoxynol-9. Eat lots of foods high in sugar or who have uncontrolled diabetes. Have low estrogen levels. Have a weakened immune system from an immune disorder or medical treatment. Are pregnant or breastfeeding. What are the signs or symptoms? Symptoms vary depending on the cause of the vaginitis. Common symptoms include: Abnormal vaginal discharge. The discharge is white, gray, or yellow with bacterial vaginosis. The discharge is thick, white, and cheesy with a yeast infection. The discharge is frothy and yellow or greenish with trichomoniasis. A bad vaginal smell. The smell is fishy with bacterial vaginosis. Vaginal itching, pain, or  swelling. Pain with sex. Pain or burning when urinating. Sometimes there are no symptoms. How is this diagnosed? This condition is diagnosed based on your symptoms and medical history. A physical exam, including a pelvic  exam, will also be done. You may also have other tests, including: Tests to determine the pH level (acidity or alkalinity) of your vagina. A whiff test to assess the odor that results when a sample of your vaginal discharge is mixed with a potassium hydroxide solution. Tests of vaginal fluid. A sample will be examined under a microscope. How is this treated? Treatment varies depending on the type of vaginitis you have. Your treatment may include: Antibiotic creams or pills to treat bacterial vaginosis and trichomoniasis. Antifungal medicines, such as vaginal creams or suppositories, to treat a yeast infection. Medicine to ease discomfort if you have viral vaginitis. Your sexual partner should also be treated. Estrogen delivered in a cream, pill, suppository, or vaginal ring to treat atrophic vaginitis. If vaginal dryness occurs, lubricants and moisturizing creams may help. You may need to avoid scented soaps, sprays, or douches. Stopping use of a product that is causing allergic vaginitis and then using a vaginal cream to treat the symptoms. Follow these instructions at home: Lifestyle Keep your genital area clean and dry. Avoid soap, and only rinse the area with water. Do not douche or use tampons until your health care provider says it is okay. Use sanitary pads, if needed. Do not have sex until your health care provider approves. When you can return to sex, practice safe sex and use condoms. Wipe from front to back. This avoids the spread of bacteria from the rectum to the vagina. General instructions Take over-the-counter and prescription medicines only as told by your health care provider. If you were prescribed an antibiotic medicine, take or use it as told by your health care provider. Do not stop taking or using the antibiotic even if you start to feel better. Keep all follow-up visits. This is important. How is this prevented? Use mild, unscented products. Do not use things that can  irritate the vagina, such as fabric softeners. Avoid the following products if they are scented: Feminine sprays. Detergents. Tampons. Feminine hygiene products. Soaps or bubble baths. Let air reach your genital area. To do this: Wear cotton underwear to reduce moisture buildup. Avoid wearing underwear while you sleep. Avoid wearing tight pants and underwear or nylons without a cotton panel. Avoid wearing thong underwear. Take off any wet clothing, such as bathing suits, as soon as possible. Practice safe sex and use condoms. Contact a health care provider if: You have abdominal or pelvic pain. You have a fever or chills. You have symptoms that last for more than 2-3 days. Get help right away if: You have a fever and your symptoms suddenly get worse. Summary Vaginitis is a condition in which the vaginal tissue becomes inflamed.This condition is most often caused by a change in the normal balance of bacteria and yeast that live in the vagina. Treatment varies depending on the type of vaginitis you have. Do not douche, use tampons, or have sex until your health care provider approves. When you can return to sex, practice safe sex and use condoms. This information is not intended to replace advice given to you by your health care provider. Make sure you discuss any questions you have with your health care provider. Document Revised: 10/09/2019 Document Reviewed: 10/09/2019 Elsevier Patient Education  [place].

## 2021-03-28 ENCOUNTER — Telehealth: Payer: Self-pay

## 2021-03-28 LAB — SURESWAB CT/NG/T. VAGINALIS
C. trachomatis RNA, TMA: NOT DETECTED
N. gonorrhoeae RNA, TMA: NOT DETECTED
Trichomonas vaginalis RNA: NOT DETECTED

## 2021-03-28 NOTE — Telephone Encounter (Signed)
Patient called because she was prescribed Clindamycin vag cream on 03/25/21.  She said it is very expensive and she would like to see if Rx can be changed to oral Rx that she said is more cost effective for her.

## 2021-03-29 MED ORDER — CLINDAMYCIN HCL 300 MG PO CAPS: ORAL_CAPSULE | ORAL | 0 refills | Status: DC

## 2021-03-29 NOTE — Telephone Encounter (Signed)
Please let the patient know that I have called in the oral clindamycin for her.

## 2021-03-29 NOTE — Telephone Encounter (Signed)
Per DPR access note on file detailed message left in voice mail informing patient.

## 2021-04-27 ENCOUNTER — Telehealth: Payer: Self-pay

## 2021-04-27 NOTE — Telephone Encounter (Signed)
Copied from [place] [phone] [id number]. Topic: General - Other >> Apr 27, 2021 12:53 PM Leward Quan A wrote: Reason for CRM: Patient called in stated that she have a quick question for Ms Garnette Gunner can be reached at Ph# [phone]

## 2021-04-28 NOTE — Telephone Encounter (Signed)
Called patient and discussed her concerns.

## 2021-06-29 ENCOUNTER — Ambulatory Visit (INDEPENDENT_AMBULATORY_CARE_PROVIDER_SITE_OTHER): Payer: 59 | Admitting: Nurse Practitioner

## 2021-06-29 ENCOUNTER — Encounter: Payer: Self-pay | Admitting: *Deleted

## 2021-06-29 ENCOUNTER — Telehealth: Payer: Self-pay | Admitting: Nurse Practitioner

## 2021-06-29 ENCOUNTER — Other Ambulatory Visit: Payer: Self-pay

## 2021-06-29 ENCOUNTER — Encounter: Payer: Self-pay | Admitting: Nurse Practitioner

## 2021-06-29 ENCOUNTER — Other Ambulatory Visit: Payer: Self-pay | Admitting: Nurse Practitioner

## 2021-06-29 VITALS — BP 98/70 | HR 82 | Temp 96.9°F | Resp 10 | Ht 64.0 in | Wt 158.1 lb

## 2021-06-29 DIAGNOSIS — Z01818 Encounter for other preprocedural examination: Secondary | ICD-10-CM

## 2021-06-29 DIAGNOSIS — R61 Generalized hyperhidrosis: Secondary | ICD-10-CM

## 2021-06-29 LAB — HEMOGLOBIN A1C: Hgb A1c MFr Bld: 5.9 % (ref 4.6–6.5)

## 2021-06-29 LAB — CBC
HCT: 43.9 % (ref 36.0–46.0)
Hemoglobin: 14.4 g/dL (ref 12.0–15.0)
MCHC: 32.7 g/dL (ref 30.0–36.0)
MCV: 87.7 fl (ref 78.0–100.0)
Platelets: 289 10*3/uL (ref 150.0–400.0)
RBC: 5.01 Mil/uL (ref 3.87–5.11)
RDW: 14.6 % (ref 11.5–15.5)
WBC: 6.4 10*3/uL (ref 4.0–10.5)

## 2021-06-29 LAB — COMPREHENSIVE METABOLIC PANEL
ALT: 12 U/L (ref 0–35)
AST: 16 U/L (ref 0–37)
Albumin: 4.9 g/dL (ref 3.5–5.2)
Alkaline Phosphatase: 49 U/L (ref 39–117)
BUN: 12 mg/dL (ref 6–23)
CO2: 27 mEq/L (ref 19–32)
Calcium: 9.8 mg/dL (ref 8.4–10.5)
Chloride: 101 mEq/L (ref 96–112)
Creatinine, Ser: 0.8 mg/dL (ref 0.40–1.20)
GFR: 96.46 mL/min (ref >60.00)
Glucose, Bld: 94 mg/dL (ref 70–99)
Potassium: 4 mEq/L (ref 3.5–5.1)
Sodium: 139 mEq/L (ref 135–145)
Total Bilirubin: 0.9 mg/dL (ref 0.2–1.2)
Total Protein: 7.5 g/dL (ref 6.0–8.3)

## 2021-06-29 LAB — T4, FREE: Free T4: 1.06 ng/dL (ref 0.60–1.60)

## 2021-06-29 LAB — TSH: TSH: 0.55 u[IU]/mL (ref 0.35–5.50)

## 2021-06-29 LAB — PROTIME-INR
INR: 1.1 ratio — ABNORMAL HIGH (ref 0.8–1.0)
Prothrombin Time: 12.2 s (ref 9.6–13.1)

## 2021-06-29 LAB — T3, FREE: T3, Free: 5.1 pg/mL — ABNORMAL HIGH (ref 2.3–4.2)

## 2021-06-29 MED ORDER — DRYSOL 20 % EX SOLN: Freq: Every day | CUTANEOUS | 0 refills | Status: DC

## 2021-06-29 NOTE — Progress Notes (Signed)
Established Patient Office Visit  Subjective:  Patient ID: [name], female    DOB: 02/20/1986  Age: 36 y.o. MRN: [id number]  CC:  Chief Complaint  Patient presents with   Transfer of Care   Pre-op Exam    Procedure is liposuction, breast lift with implant. Will be done on 07/26/2021 by Dr Jaquelyn Bitter Digeronimo-has form to be filled out.   Medication Refill    Would like to see if she can get a refill on Drysol-medication for excessive sweating    HPI [name] presents for TOC and pre opt exam    Immunizations: -Tetanus:2014 -Influenza: refused -Covid-19: pfizer x1 -Shingles: NA -Pneumonia: NA  -HPV: UTD  Diet: Fair diet.  Eats once a day. Water and juice throughout the day.  Exercise: No regular exercise. Walking on campus for school studying  Eye exam: Completes annually. glasses with Driving and distance. Within the year Dental exam: Completes semi-annually   Pap Smear: Completed in [place] Bruins with in the year Mammogram: Completed in [place] and [place] in 2018 with fibroadenoma  Lung Cancer Screening: NA. Former smoker    Hyperhydorsis. States that she will have some excessive sweating on her underarms hands and feet. States uses it for a few weeks and then stops for a few month  Past Medical History:  Diagnosis Date   ASCUS of cervix with negative high risk HPV 05/2016   Chicken pox    Elevated prolactin level    30 range 12/2008, normal X multiple repeats, most recent 18 04/2011   Fibroadenoma of breast    LEFT   IBS (irritable bowel syndrome)    LGSIL (low grade squamous intraepithelial dysplasia) 12/2008, 02/2011   C&B WITH LGSIL 02/2011   MRSA (methicillin resistant staph aureus) culture positive 04/2014   vulvar abscess   STD (sexually transmitted disease)    History of Chlamydia    Past Surgical History:  Procedure Laterality Date   ADENOIDECTOMY     ASA CHILD   CESAREAN SECTION N/A 03/06/2013   Procedure: CESAREAN  SECTION;  Surgeon: Melina Schools, MD;  Location: [facility];  Service: Obstetrics;  Laterality: N/A;   COLPOSCOPY     DILATION AND CURETTAGE OF UTERUS     INDUCED ABORTION  2007,2008   x2   Nexplanon insertion  08/08/2016    Family History  Problem Relation Age of Onset   Aneurysm Mother    Other Brother        pre diabetic   Aneurysm Maternal Grandfather    Alcohol abuse Neg Hx    Arthritis Neg Hx    Asthma Neg Hx    Birth defects Neg Hx    COPD Neg Hx    Depression Neg Hx    Diabetes Neg Hx    Drug abuse Neg Hx    Early death Neg Hx    Hearing loss Neg Hx    Heart disease Neg Hx    Hyperlipidemia Neg Hx    Hypertension Neg Hx    Kidney disease Neg Hx    Learning disabilities Neg Hx    Mental illness Neg Hx    Mental retardation Neg Hx    Miscarriages / Stillbirths Neg Hx    Vision loss Neg Hx    Varicose Veins Neg Hx     Social History   Socioeconomic History   Marital status: Single    Spouse name: Not on file   Number of children: 2  Years of education: Not on file   Highest education level: Not on file  Occupational History   Occupation: Therapist, 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 on some weekends 2 times a month   Drug use: Not Currently    Types: Marijuana   Sexual activity: Yes    Partners: Male    Birth control/protection: Implant  Other Topics Concern   Not on file  Social History Narrative   Fulltime: [employer]   Social Determinants of Health   Financial Resource Strain: Not on file  Food Insecurity: Not on file  Transportation Needs: Not on file  Physical Activity: Not on file  Stress: Not on file  Social  Connections: Not on file  Intimate Partner Violence: Not on file    Outpatient Medications Prior to Visit  Medication Sig Dispense Refill   etonogestrel (NEXPLANON) 68 MG IMPL implant 1 each by Subdermal route once.     clindamycin (CLEOCIN) 2 % vaginal cream Place 1 Applicatorful vaginally at bedtime. 40 g 0   clindamycin (CLEOCIN) 300 MG capsule Take one capsule BID x 7D 14 capsule 0   No facility-administered medications prior to visit.    Allergies  Allergen Reactions   Banana Itching   Flagyl [Metronidazole] Hives and Itching    ROS Review of Systems  Constitutional:  Negative for chills, fatigue and fever.  Respiratory:  Negative for cough and shortness of breath.   Cardiovascular:  Negative for chest pain and leg swelling.  Gastrointestinal:  Negative for abdominal pain, diarrhea, nausea and vomiting.       BM daily  Genitourinary:  Negative for difficulty urinating, vaginal bleeding, vaginal discharge and vaginal pain.  Neurological:  Negative for dizziness, light-headedness, numbness and headaches.  Psychiatric/Behavioral:  Negative for hallucinations and suicidal ideas.      Objective:    Physical Exam Vitals and nursing note reviewed.  Constitutional:      Appearance: Normal appearance.  HENT:     Right Ear: Tympanic membrane, ear canal and external ear normal.     Left Ear: Tympanic membrane, ear canal and external ear normal.     Mouth/Throat:     Mouth: Mucous membranes are moist.     Pharynx: Oropharynx is clear.  Eyes:     Extraocular Movements: Extraocular movements intact.     Pupils: Pupils are equal, round, and reactive to light.  Neck:     Thyroid: No thyroid mass, thyromegaly or thyroid tenderness.  Cardiovascular:     Rate and Rhythm: Normal rate and regular rhythm.     Pulses: Normal pulses.     Heart sounds: Normal heart sounds.  Pulmonary:     Effort: Pulmonary effort is normal.     Breath sounds: Normal breath sounds.  Abdominal:      General: Bowel sounds are normal. There is no distension.     Palpations: There is no mass.     Tenderness: There is no abdominal tenderness.     Hernia: No hernia is present.  Musculoskeletal:     Right lower leg:  No edema.     Left lower leg: No edema.  Lymphadenopathy:     Cervical: No cervical adenopathy.  Neurological:     General: No focal deficit present.     Mental Status: She is alert.     Deep Tendon Reflexes:     Reflex Scores:      Bicep reflexes are 2+ on the right side and 2+ on the left side.      Patellar reflexes are 2+ on the right side and 2+ on the left side.    Comments: Bilateral upper and lower extremity strength 5/5    BP 98/70    Pulse 82    Temp (!) 96.9 F (36.1 C)    Resp 10    Ht '5\' 4"'$  (1.626 m)    Wt 158 lb 2 oz (71.7 kg)    SpO2 96%    BMI 27.14 kg/m  Wt Readings from Last 3 Encounters:  06/29/21 158 lb 2 oz (71.7 kg)  02/01/21 157 lb (71.2 kg)  01/31/21 157 lb (71.2 kg)     There are no preventive care reminders to display for this patient.  There are no preventive care reminders to display for this patient.  Lab Results  Component Value Date   TSH 0.84 07/05/2020   Lab Results  Component Value Date   WBC 6.9 07/05/2020   HGB 15.1 (H) 07/05/2020   HCT 45.0 07/05/2020   MCV 86.7 07/05/2020   PLT 277.0 07/05/2020   Lab Results  Component Value Date   NA 138 07/05/2020   K 3.8 07/05/2020   CO2 27 07/05/2020   GLUCOSE 93 07/05/2020   BUN 14 07/05/2020   CREATININE 0.81 07/05/2020   BILITOT 1.9 (H) 07/05/2020   ALKPHOS 57 07/05/2020   AST 29 07/05/2020   ALT 23 07/05/2020   PROT 7.9 07/05/2020   ALBUMIN 4.9 07/05/2020   CALCIUM 10.1 07/05/2020   ANIONGAP 8 03/24/2015   GFR 95.69 07/05/2020   Lab Results  Component Value Date   CHOL 169 07/03/2019   Lab Results  Component Value Date   HDL 47 (L) 07/03/2019   Lab Results  Component Value Date   LDLCALC 106 (H) 07/03/2019   Lab Results  Component Value Date   TRIG 73  07/03/2019   Lab Results  Component Value Date   CHOLHDL 3.6 07/03/2019   Lab Results  Component Value Date   HGBA1C 5.6 07/05/2020      Assessment & Plan:   Problem List Items Addressed This Visit       Other   Excessive sweating    Patient has history of the same.  Has been managed on Drysol in the past and done well.  We will renew Drysol prescription for patient      Relevant Medications   aluminum chloride (DRYSOL) 20 % external solution   Pre-op exam - Primary    Patient presents for transfer of care and surgical clearance for liposuction and breast lift.  Patient is present surgery on 07/26/2021.  With Dr. Jaquelyn Bitter Digeronimo.  Did discuss work-up with patient.  Ordered all required blood work along with breast ultrasound bilaterally.  EKG within normal limits in office.  Pending lab results and breast ultrasounds we will fill out preoperative medical clearance form.  Patient should be at low risk as she not currently drink and she no longer smokes has been quit for several years now.  Patient has no real medical history  Relevant Orders   EKG 12-Lead (Completed)   CBC   Comprehensive metabolic panel   T3, free   T4, free   TSH   HIV Antibody (routine testing w rflx)   Hemoglobin A1c   Protime-INR   hCG, serum, qualitative   US BREAST COMPLETE UNI LEFT INC AXILLA   US BREAST COMPLETE UNI RIGHT INC AXILLA    Meds ordered this encounter  Medications   aluminum chloride (DRYSOL) 20 % external solution    Sig: Apply topically at bedtime.    Dispense:  35 mL    Refill:  0    Order Specific Question:   Supervising Provider    Answer:   Loura Pardon A [[id number]]    Follow-up: Return in about 1 year (around 06/30/2022) for cpe.   This visit occurred during the SARS-CoV-2 public health emergency.  Safety protocols were in place, including screening questions prior to the visit, additional usage of staff PPE, and extensive cleaning of exam room while observing  appropriate contact time as indicated for disinfecting solutions.   Romilda Garret, NP

## 2021-06-29 NOTE — Telephone Encounter (Signed)
Patient states she found out that she does need a chest x-ray for surgery...would like to come in and get that done ? ?In addition the patient has different paperwork for her surgical clearance and asks to throw away what she left today, and she will bring by the additional paperwork. ? ? ?

## 2021-06-29 NOTE — Assessment & Plan Note (Signed)
Patient has history of the same.  Has been managed on Drysol in the past and done well.  We will renew Drysol prescription for patient ?

## 2021-06-29 NOTE — Patient Instructions (Signed)
Nice to see you today ?I will be in touch with the lab results and Breast ultrasound results once I have them ?Follow up in 1 year for you physcial with me, sooner if needed ?

## 2021-06-29 NOTE — Telephone Encounter (Signed)
Noted. I will hold on to this form until I have the new form. I will place an order for the xray ?

## 2021-06-29 NOTE — Assessment & Plan Note (Signed)
Patient presents for transfer of care and surgical clearance for liposuction and breast lift.  Patient is present surgery on 07/26/2021.  With Dr. Jaquelyn Bitter Digeronimo.  Did discuss work-up with patient.  Ordered all required blood work along with breast ultrasound bilaterally.  EKG within normal limits in office.  Pending lab results and breast ultrasounds we will fill out preoperative medical clearance form.  Patient should be at low risk as she not currently drink and she no longer smokes has been quit for several years now.  Patient has no real medical history ?

## 2021-06-29 NOTE — Addendum Note (Signed)
Addended by: Michela Pitcher on: 06/29/2021 05:12 PM ? ? Modules accepted: Orders ? ?

## 2021-06-30 ENCOUNTER — Ambulatory Visit (INDEPENDENT_AMBULATORY_CARE_PROVIDER_SITE_OTHER)
Admission: RE | Admit: 2021-06-30 | Discharge: 2021-06-30 | Disposition: A | Discharge status: Home / Self Care | Payer: 59 | Source: Ambulatory Visit | Attending: Nurse Practitioner | Admitting: Nurse Practitioner

## 2021-06-30 ENCOUNTER — Ambulatory Visit
Admission: RE | Admit: 2021-06-30 | Discharge: 2021-06-30 | Disposition: A | Discharge status: Home / Self Care | Payer: 59 | Source: Ambulatory Visit | Attending: Nurse Practitioner | Admitting: Nurse Practitioner

## 2021-06-30 ENCOUNTER — Telehealth: Payer: Self-pay | Admitting: Nurse Practitioner

## 2021-06-30 DIAGNOSIS — Z01818 Encounter for other preprocedural examination: Secondary | ICD-10-CM | POA: Diagnosis not present

## 2021-06-30 LAB — HIV ANTIBODY (ROUTINE TESTING W REFLEX): HIV 1&2 Ab, 4th Generation: NONREACTIVE

## 2021-06-30 LAB — HCG, SERUM, QUALITATIVE: Preg, Serum: NEGATIVE

## 2021-06-30 NOTE — Telephone Encounter (Signed)
Patient advised.

## 2021-06-30 NOTE — Telephone Encounter (Signed)
Pt dropped off paperwork ? ? ? ?Type of forms received:pre-op med clearance ? ?Routed [id number] ? ?Paperwork received by : terrill ? ? ?Individual made aware of 3-5 business day turn around (Y/N):y ? ?Form completed and patient made aware of charges(Y/N):y ? ? ?Faxed to :  ? ?Form location:  cable box ?

## 2021-06-30 NOTE — Telephone Encounter (Signed)
Placed form in Matt's inbox for review ?

## 2021-07-01 ENCOUNTER — Other Ambulatory Visit: Payer: Self-pay | Admitting: Nurse Practitioner

## 2021-07-01 DIAGNOSIS — R928 Other abnormal and inconclusive findings on diagnostic imaging of breast: Secondary | ICD-10-CM

## 2021-07-06 ENCOUNTER — Ambulatory Visit
Admission: RE | Admit: 2021-07-06 | Discharge: 2021-07-06 | Disposition: A | Discharge status: Home / Self Care | Payer: 59 | Source: Ambulatory Visit | Attending: Nurse Practitioner | Admitting: Nurse Practitioner

## 2021-07-06 ENCOUNTER — Ambulatory Visit: Payer: 59

## 2021-07-06 DIAGNOSIS — R928 Other abnormal and inconclusive findings on diagnostic imaging of breast: Secondary | ICD-10-CM

## 2021-07-07 ENCOUNTER — Other Ambulatory Visit: Payer: Self-pay | Admitting: Nurse Practitioner

## 2021-07-08 ENCOUNTER — Other Ambulatory Visit: Payer: Self-pay

## 2021-07-08 ENCOUNTER — Other Ambulatory Visit (INDEPENDENT_AMBULATORY_CARE_PROVIDER_SITE_OTHER): Payer: 59

## 2021-07-08 DIAGNOSIS — Z01818 Encounter for other preprocedural examination: Secondary | ICD-10-CM | POA: Diagnosis not present

## 2021-07-08 LAB — APTT: aPTT: 36.4 s — ABNORMAL HIGH (ref 23.4–32.7)

## 2021-07-09 LAB — T3 UPTAKE: T3 Uptake: 33 % (ref 22–35)

## 2021-07-28 ENCOUNTER — Telehealth: Payer: Self-pay | Admitting: Nurse Practitioner

## 2021-07-28 NOTE — Telephone Encounter (Signed)
Pt called stating that she had Breast implants done in [place]. Pt is asking if you could remove the stables. Please advise. ?

## 2021-07-28 NOTE — Telephone Encounter (Signed)
I am out of office but she can schedule with another provider. I will send this to my MA to reach out to her ?

## 2021-07-28 NOTE — Telephone Encounter (Signed)
Spoke with [name] who is able to see patient for this. Spoke with patient and scheduled for 08/02/21. ?

## 2021-08-02 ENCOUNTER — Encounter: Payer: Self-pay | Admitting: Family

## 2021-08-02 ENCOUNTER — Ambulatory Visit (INDEPENDENT_AMBULATORY_CARE_PROVIDER_SITE_OTHER): Payer: 59 | Admitting: Family

## 2021-08-02 VITALS — BP 104/70 | HR 93 | Temp 99.5°F | Resp 16 | Ht 64.0 in | Wt 158.2 lb

## 2021-08-02 DIAGNOSIS — L03818 Cellulitis of other sites: Secondary | ICD-10-CM | POA: Diagnosis not present

## 2021-08-02 DIAGNOSIS — S21239A Puncture wound without foreign body of unspecified back wall of thorax without penetration into thoracic cavity, initial encounter: Secondary | ICD-10-CM

## 2021-08-02 DIAGNOSIS — Z428 Encounter for other plastic and reconstructive surgery following medical procedure or healed injury: Secondary | ICD-10-CM | POA: Insufficient documentation

## 2021-08-02 DIAGNOSIS — L0390 Cellulitis, unspecified: Secondary | ICD-10-CM | POA: Insufficient documentation

## 2021-08-02 DIAGNOSIS — Z4802 Encounter for removal of sutures: Secondary | ICD-10-CM | POA: Diagnosis not present

## 2021-08-02 DIAGNOSIS — Z9882 Breast implant status: Secondary | ICD-10-CM

## 2021-08-02 MED ORDER — MUPIROCIN 2 % EX OINT: 1.0000 "application " | TOPICAL_OINTMENT | Freq: Two times a day (BID) | CUTANEOUS | 0 refills | Status: AC

## 2021-08-02 NOTE — Assessment & Plan Note (Addendum)
rx mupirocin 2% 

## 2021-08-02 NOTE — Assessment & Plan Note (Signed)
Obtained verbal consent to remove staples, on day 7 post op  ?Incision site healing nicely ?Applied betadine solution ?Used staple remover to remove 3 staples vertically on lower breast bil, total six staples removed ?Pt tolerated procedure well ?Scant bleeding easily stopped with applying pressure from one upper left breast staple site.  ?No drainage, no discharged. Edges approximated.  ?

## 2021-08-02 NOTE — Progress Notes (Addendum)
Established Patient Office Visit  Subjective:  Patient ID: [name], female    DOB: Jul 08, 1985  Age: 36 y.o. MRN: [id number]  CC:  Chief Complaint  Patient presents with   Suture / Staple Removal    Has pain where the staples are located.    HPI [place] is here today with concerns.  Bil breast implants with saline, placed on 07/26/21 in [place].  She is here for staple removal. She thinks she has three stables on each size. Breasts are still sore but denies redness, weeping/discharge from site and or signs of infection per her knowledge.  No fever or chills.  She also has dissolvable sutures in place for areola.  Healing sites form liposuction as well.   Was given zpack post op, already completed.  Has prescription for cephalexin to start day 10 after surgery.   Past Medical History:  Diagnosis Date   ASCUS of cervix with negative high risk HPV 05/2016   Chicken pox    Elevated prolactin level    30 range 12/2008, normal X multiple repeats, most recent 18 04/2011   Fibroadenoma of breast    LEFT   IBS (irritable bowel syndrome)    LGSIL (low grade squamous intraepithelial dysplasia) 12/2008, 02/2011   C&B WITH LGSIL 02/2011   MRSA (methicillin resistant staph aureus) culture positive 04/2014   vulvar abscess   STD (sexually transmitted disease)    History of Chlamydia    Past Surgical History:  Procedure Laterality Date   ADENOIDECTOMY     ASA CHILD   BREAST BIOPSY Left 03/2014   CESAREAN SECTION N/A 03/06/2013   Procedure: CESAREAN SECTION;  Surgeon: Bing Plume, MD;  Location: [facility];  Service: Obstetrics;  Laterality: N/A;   COLPOSCOPY     DILATION AND CURETTAGE OF UTERUS     INDUCED ABORTION  2007,2008   x2   Nexplanon insertion  08/08/2016    Family History  Problem Relation Age of Onset   Aneurysm Mother    Other Brother        pre diabetic   Aneurysm Maternal Grandfather    Alcohol abuse Neg Hx    Arthritis Neg Hx    Asthma  Neg Hx    Birth defects Neg Hx    COPD Neg Hx    Depression Neg Hx    Diabetes Neg Hx    Drug abuse Neg Hx    Early death Neg Hx    Hearing loss Neg Hx    Heart disease Neg Hx    Hyperlipidemia Neg Hx    Hypertension Neg Hx    Kidney disease Neg Hx    Learning disabilities Neg Hx    Mental illness Neg Hx    Mental retardation Neg Hx    Miscarriages / Stillbirths Neg Hx    Vision loss Neg Hx    Varicose Veins Neg Hx     Social History   Socioeconomic History   Marital status: Single    Spouse name: Not on file   Number of children: 2   Years of education: Not on file   Highest education level: Not on file  Occupational History   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 on some weekends 2 times a month   Drug use: Not Currently    Types: Marijuana   Sexual activity: Yes    Partners: Male    Birth control/protection: Implant  Other Topics Concern   Not on file  Social History Narrative   Fulltime: [employer]   Social Determinants of Health   Financial Resource Strain: Not on file  Food Insecurity: Not on file  Transportation Needs: Not on file  Physical Activity: Not on file  Stress: Not on file  Social Connections: Not on file  Intimate Partner Violence: Not on file    Outpatient Medications Prior to Visit  Medication Sig Dispense Refill   etonogestrel (NEXPLANON) 68 MG IMPL implant 1 each by Subdermal route once.     aluminum chloride (DRYSOL) 20 % external solution Apply topically at bedtime. (Patient not taking: Reported on 08/02/2021) 35 mL 0   No facility-administered medications prior to visit.    Allergies   Allergen Reactions   Banana Itching   Flagyl [Metronidazole] Hives and Itching    ROS Review of Systems  Constitutional:  Negative for chills, fatigue and fever.       Pt wearing compression outfit for healing  Respiratory:  Negative for cough and shortness of breath.   Cardiovascular:  Negative for chest pain.  Gastrointestinal:  Negative for abdominal distention and abdominal pain.  Skin:  Negative for rash and wound.       Post op breast implantation with 3 stable each lower breast, with dissolving sutures on aerola. No discharge, no redness, no odor to site. Also multiple insertion sites from liposuction on bil lower stomach and back, healing well.      Objective:    Physical Exam Constitutional:      General: She is not in acute distress.    Appearance: Normal appearance. She is normal weight. She is not ill-appearing, toxic-appearing or diaphoretic.  Cardiovascular:     Rate and Rhythm: Normal rate and regular rhythm.  Pulmonary:     Effort: Pulmonary effort is normal.     Breath sounds: Normal breath sounds.  Abdominal:     General: Abdomen is flat.     Tenderness: There is no abdominal tenderness.  Skin:    Comments: Incision site down middle of lower breast bil, right breast no erythema, no discharge, no odor. Tender to touch. Breast with some swelling, not otherwise expected. Left breast, slight erythema along incision line however not warm to touch. No discharge. Some swelling, expected. Three staples in place on each lower bil breast incision edges approximated and healing. Not able to separate.   Insertion sites from liposuction, seen bil lower abdomen with no erythema drainage or tenderness. One on mid back with some dried yellow drainage, nontender. Two lower back bil with no erythema, no tenderness, no discharge.   Neurological:     General: No focal deficit present.     Mental Status: She is alert and oriented to person, place, and time.  Psychiatric:         Mood and Affect: Mood normal.        Behavior: Behavior normal.        Thought Content: Thought content normal.        Judgment: Judgment normal.    BP 104/70  Pulse 93   Temp 99.5 F (37.5 C)   Resp 16   Ht 5\' 4"  (1.626 m)   Wt 158 lb 3 oz (71.8 kg)   SpO2 98%   BMI 27.15 kg/m  Wt Readings from Last 3 Encounters:  08/02/21 158 lb 3 oz (71.8 kg)  06/29/21 158 lb 2 oz (71.7 kg)  02/01/21 157 lb (71.2 kg)     There are no preventive care reminders to display for this patient.  There are no preventive care reminders to display for this patient.  Lab Results  Component Value Date   TSH 0.55 06/29/2021   Lab Results  Component Value Date   WBC 6.4 06/29/2021   HGB 14.4 06/29/2021   HCT 43.9 06/29/2021   MCV 87.7 06/29/2021   PLT 289.0 06/29/2021   Lab Results  Component Value Date   NA 139 06/29/2021   K 4.0 06/29/2021   CO2 27 06/29/2021   GLUCOSE 94 06/29/2021   BUN 12 06/29/2021   CREATININE 0.80 06/29/2021   BILITOT 0.9 06/29/2021   ALKPHOS 49 06/29/2021   AST 16 06/29/2021   ALT 12 06/29/2021   PROT 7.5 06/29/2021   ALBUMIN 4.9 06/29/2021   CALCIUM 9.8 06/29/2021   ANIONGAP 8 03/24/2015   GFR 96.46 06/29/2021   Lab Results  Component Value Date   HGBA1C 5.9 06/29/2021      Assessment & Plan:   Problem List Items Addressed This Visit       Other   Encounter for elective surgery following healed injury or operation    Liposuction insertion sites assessed and examined.  Healing nicely however mid back with yellow scabbed over site, rx mupirocin 2% Pt to monitor daily for worsening s/s infection Advised to cover while sleeping as appears may have been scratching site       S/P bilateral breast implants    Incision sites assessed Pt to monitor that dissolvable sutures come out on own, if not please f/u  Monitor site for s/s infection, d/w pt red flags        Encounter for removal of staples    Obtained verbal consent to remove staples,  on day 7 post op  Incision site healing nicely Applied betadine solution Used staple remover to remove 3 staples vertically on lower breast bil, total six staples removed Pt tolerated procedure well Scant bleeding easily stopped with applying pressure from one upper left breast staple site.  No drainage, no discharged. Edges approximated.        Puncture wound of back    rx mupirocin 2%          Cellulitis - Primary    Monitor site for worsening s/s infection.  Start cephalexin as prescribed.        Relevant Medications   mupirocin ointment (BACTROBAN) 2 %    Meds ordered this encounter  Medications   mupirocin ointment (BACTROBAN) 2 %    Sig: Apply 1 application. topically 2 (two) times daily for 14 days.    Dispense:  28 g    Refill:  0    Order Specific Question:   Supervising Provider    Answer:   BEDSOLE, AMY E [[id number]]    Follow-up: Return if symptoms worsen or fail to improve with pcp.    Mort Sawyers, FNP

## 2021-08-02 NOTE — Assessment & Plan Note (Addendum)
Liposuction insertion sites assessed and examined.  ?Healing nicely however mid back with yellow scabbed over site, rx mupirocin 2% ?Pt to monitor daily for worsening s/s infection ?Advised to cover while sleeping as appears may have been scratching site ?

## 2021-08-02 NOTE — Patient Instructions (Signed)
Watch for signs symptoms of infection to include increasing redness, discharge at site, rash, or fever/chills ?Sent in mupirocin ointment to apply to site on mid back, twice daily as directed.  ? ?Due to recent changes in healthcare laws, you may see results of your imaging and/or laboratory studies on MyChart before I have had a chance to review them.  I understand that in some cases there may be results that are confusing or concerning to you. Please understand that not all results are received at the same time and often I may need to interpret multiple results in order to provide you with the best plan of care or course of treatment. Therefore, I ask that you please give me 2 business days to thoroughly review all your results before contacting my office for clarification. Should we see a critical lab result, you will be contacted sooner.  ? ?It was a pleasure seeing you today! Please do not hesitate to reach out with any questions and or concerns. ? ?Regards,  ? ?Sheletha Bow ?FNP-C ? ?

## 2021-08-02 NOTE — Assessment & Plan Note (Signed)
Incision sites assessed ?Pt to monitor that dissolvable sutures come out on own, if not please f/u  ?Monitor site for s/s infection, d/w pt red flags ? ?

## 2021-08-15 NOTE — Assessment & Plan Note (Signed)
Monitor site for worsening s/s infection.  ?Start cephalexin as prescribed.  ?

## 2021-09-05 ENCOUNTER — Encounter: Payer: Self-pay | Admitting: Family

## 2021-09-05 ENCOUNTER — Ambulatory Visit (INDEPENDENT_AMBULATORY_CARE_PROVIDER_SITE_OTHER): Payer: 59 | Admitting: Family

## 2021-09-05 ENCOUNTER — Other Ambulatory Visit: Payer: Self-pay

## 2021-09-05 VITALS — BP 96/62 | HR 67 | Temp 98.9°F | Resp 16 | Ht 64.0 in | Wt 167.2 lb

## 2021-09-05 DIAGNOSIS — L03818 Cellulitis of other sites: Secondary | ICD-10-CM | POA: Diagnosis not present

## 2021-09-05 DIAGNOSIS — R635 Abnormal weight gain: Secondary | ICD-10-CM | POA: Diagnosis not present

## 2021-09-05 LAB — CBC WITH DIFFERENTIAL/PLATELET
Basophils Absolute: 0 10*3/uL (ref 0.0–0.1)
Basophils Relative: 0.6 % (ref 0.0–3.0)
Eosinophils Absolute: 0.2 10*3/uL (ref 0.0–0.7)
Eosinophils Relative: 3 % (ref 0.0–5.0)
HCT: 39.3 % (ref 36.0–46.0)
Hemoglobin: 12.5 g/dL (ref 12.0–15.0)
Lymphocytes Relative: 38.5 % (ref 12.0–46.0)
Lymphs Abs: 2.5 10*3/uL (ref 0.7–4.0)
MCHC: 31.8 g/dL (ref 30.0–36.0)
MCV: 88.5 fl (ref 78.0–100.0)
Monocytes Absolute: 0.5 10*3/uL (ref 0.1–1.0)
Monocytes Relative: 7.3 % (ref 3.0–12.0)
Neutro Abs: 3.3 10*3/uL (ref 1.4–7.7)
Neutrophils Relative %: 50.6 % (ref 43.0–77.0)
Platelets: 291 10*3/uL (ref 150.0–400.0)
RBC: 4.44 Mil/uL (ref 3.87–5.11)
RDW: 15.9 % — ABNORMAL HIGH (ref 11.5–15.5)
WBC: 6.5 10*3/uL (ref 4.0–10.5)

## 2021-09-05 LAB — T4, FREE: Free T4: 0.85 ng/dL (ref 0.60–1.60)

## 2021-09-05 LAB — TSH: TSH: 0.83 u[IU]/mL (ref 0.35–5.50)

## 2021-09-05 LAB — T3, FREE: T3, Free: 3.7 pg/mL (ref 2.3–4.2)

## 2021-09-05 MED ORDER — DOXYCYCLINE HYCLATE 100 MG PO TABS: 100.0000 mg | ORAL_TABLET | Freq: Two times a day (BID) | ORAL | 0 refills | Status: AC

## 2021-09-05 NOTE — Progress Notes (Signed)
? ?Established Patient Office Visit ? ?Subjective:  ?Patient ID: [name], female    DOB: April 17, 1986  Age: 36 y.o. MRN: [id number] ? ?CC:  ?Chief Complaint  ?Patient presents with  ? Follow-up  ?  Wanted to check where her stiches was, and look at the stomach.  ? ? ?HPI ?[name] is here today with concerns.  ? ?Worried about a stitch from her prior surgery with  ? ?Also concerned with some weight gain, feels like gaining weight in stomach as well. In the last one month with 9 pounds gained. Does report increased hunger so has been eating more.  ?Wt Readings from Last 3 Encounters:  ?09/05/21 167 lb 3 oz (75.8 kg)  ?08/02/21 158 lb 3 oz (71.8 kg)  ?06/29/21 158 lb 2 oz (71.7 kg)  ? ? ? ?Past Medical History:  ?Diagnosis Date  ? ASCUS of cervix with negative high risk HPV 05/2016  ? Chicken pox   ? Elevated prolactin level   ? 30 range 12/2008, normal X multiple repeats, most recent 18 04/2011  ? Fibroadenoma of breast   ? LEFT  ? IBS (irritable bowel syndrome)   ? LGSIL (low grade squamous intraepithelial dysplasia) 12/2008, 02/2011  ? C&B WITH LGSIL 02/2011  ? MRSA (methicillin resistant staph aureus) culture positive 04/2014  ? vulvar abscess  ? STD (sexually transmitted disease)   ? History of Chlamydia  ? ? ?Past Surgical History:  ?Procedure Laterality Date  ? ADENOIDECTOMY    ? ASA CHILD  ? BREAST BIOPSY Left 03/2014  ? CESAREAN SECTION N/A 03/06/2013  ? Procedure: CESAREAN SECTION;  Surgeon: Melina Schools, MD;  Location: [facility];  Service: Obstetrics;  Laterality: N/A;  ? COLPOSCOPY    ? DILATION AND CURETTAGE OF UTERUS    ? INDUCED ABORTION  2007,2008  ? x2  ? Nexplanon insertion  08/08/2016  ? ? ?Family History  ?Problem Relation Age of Onset  ? Aneurysm Mother   ? Other Brother   ?     pre diabetic  ? Aneurysm Maternal Grandfather   ? Alcohol abuse Neg Hx   ? Arthritis Neg Hx   ? Asthma Neg Hx   ? Birth defects Neg Hx   ? COPD Neg Hx   ? Depression Neg Hx   ? Diabetes Neg Hx   ? Drug  abuse Neg Hx   ? Early death Neg Hx   ? Hearing loss Neg Hx   ? Heart disease Neg Hx   ? Hyperlipidemia Neg Hx   ? Hypertension Neg Hx   ? Kidney disease Neg Hx   ? Learning disabilities Neg Hx   ? Mental illness Neg Hx   ? Mental retardation Neg Hx   ? Miscarriages / Stillbirths Neg Hx   ? Vision loss Neg Hx   ? Varicose Veins Neg Hx   ? ? ?Social History  ? ?Socioeconomic History  ? Marital status: Single  ?  Spouse name: Not on file  ? Number of children: 2  ? Years of education: Not on file  ? Highest education level: Not on file  ?Occupational History  ? 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 Smoking status: Former  ?  Packs/day: 0.25  ?  Years: 2.00  ?  Pack years: 0.50  ?  Types: Cigarettes  ?  Quit date: 04/12/2010  ?  Years since quitting: 11.4  ? Smokeless tobacco: Never  ?  Vaping Use  ? Vaping Use: Never used  ?Substance and Sexual Activity  ? Alcohol use: Not Currently  ?  Comment: occ. on some weekends 2 times a month  ? Drug use: Not Currently  ?  Types: Marijuana  ? Sexual activity: Yes  ?  Partners: Male  ?  Birth control/protection: Implant  ?Other Topics Concern  ? Not on file  ?Social History Narrative  ? Fulltime: [employer]  ? ?Social Determinants of Health  ? ?Financial Resource Strain: Not on file  ?Food Insecurity: Not on file  ?Transportation Needs: Not on file  ?Physical Activity: Not on file  ?Stress: Not on file  ?Social Connections: Not on file  ?Intimate Partner Violence: Not on file  ? ? ?Outpatient Medications Prior to Visit  ?Medication Sig Dispense Refill  ? etonogestrel (NEXPLANON) 68 MG IMPL implant 1 each by Subdermal route once.    ? ?No facility-administered medications prior to visit.  ? ? ?Allergies  ?Allergen Reactions  ? Banana Itching  ? Flagyl [Metronidazole] Hives and Itching  ? ? ?ROS ?Review of Systems  ?Constitutional:  Positive for unexpected weight change (9 pound weight gain in last one month). Negative for chills, fatigue  and fever.  ?Respiratory:  Negative for cough and shortness of breath.   ?Cardiovascular:  Negative for chest pain, palpitations and leg swelling.  ?Gastrointestinal:  Positive for abdominal distention (abdomen feeling tender and bloated post liposuction).  ?Skin:  Positive for color change (redness and warmth at site of incision on left breast, upper ariola. also two stitches right lower ariola , nontender).  ? ?  ?Objective:  ?  ?Physical Exam ?Constitutional:   ?   General: She is not in acute distress. ?   Appearance: Normal appearance. She is normal weight. She is not ill-appearing, toxic-appearing or diaphoretic.  ?Cardiovascular:  ?   Rate and Rhythm: Normal rate and regular rhythm.  ?Pulmonary:  ?   Effort: Pulmonary effort is normal.  ?Chest:  ? ? ?Abdominal:  ?   Comments: Mild tenderness at liposuction incision points, no drainage or redness around sites.   ?Neurological:  ?   Mental Status: She is alert.  ? ? ?BP 96/62   Pulse 67   Temp 98.9 ?F (37.2 ?C)   Resp 16   Ht '5\' 4"'$  (1.626 m)   Wt 167 lb 3 oz (75.8 kg)   SpO2 99%   BMI 28.70 kg/m?  ?Wt Readings from Last 3 Encounters:  ?09/05/21 167 lb 3 oz (75.8 kg)  ?08/02/21 158 lb 3 oz (71.8 kg)  ?06/29/21 158 lb 2 oz (71.7 kg)  ? ? ? ?There are no preventive care reminders to display for this patient. ? ?There are no preventive care reminders to display for this patient. ? ?Lab Results  ?Component Value Date  ? TSH 0.55 06/29/2021  ? ?Lab Results  ?Component Value Date  ? WBC 6.4 06/29/2021  ? HGB 14.4 06/29/2021  ? HCT 43.9 06/29/2021  ? MCV 87.7 06/29/2021  ? PLT 289.0 06/29/2021  ? ?Lab Results  ?Component Value Date  ? NA 139 06/29/2021  ? K 4.0 06/29/2021  ? CO2 27 06/29/2021  ? GLUCOSE 94 06/29/2021  ? BUN 12 06/29/2021  ? CREATININE 0.80 06/29/2021  ? BILITOT 0.9 06/29/2021  ? ALKPHOS 49 06/29/2021  ? AST 16 06/29/2021  ? ALT 12 06/29/2021  ? PROT 7.5 06/29/2021  ? ALBUMIN 4.9 06/29/2021  ? CALCIUM 9.8 06/29/2021  ? ANIONGAP 8  03/24/2015  ?  GFR 96.46 06/29/2021  ? ?Lab Results  ?Component Value Date  ? HGBA1C 5.9 06/29/2021  ? ? ?  ?Assessment & Plan:  ? ?Problem List Items Addressed This Visit   ? ?  ? Other  ? Cellulitis  ?  Of left upper areola/breast.  ?Removed stitches with betadine as well as with scissors and tweezers, no foreign body afterwards.  ?Applied triple antbx ointment. Pt tolerated procedure well. Applied alcohol post as well. Pt to monitor site, will give rx doxycycline 100 mg to take, and apply bactroban twice daily monitor for worsening s/s infection. Keep updated with surgeon.  ? ?Also removed with scissors and tweezers two small sutures, white. Came out without issue. ? ?  ?  ? Relevant Medications  ? doxycycline (VIBRA-TABS) 100 MG tablet  ? Other Relevant Orders  ? CBC with Differential/Platelet  ? Weight gain - Primary  ?  Thyroid panel workup  ?Increased hunger, advised pt to make sure she is watching her portion control.  ? ?  ?  ? Relevant Orders  ? Thyroid Peroxidase Antibodies (TPO) (REFL)  ? T3, free  ? T4, free  ? TSH  ? ? ?Meds ordered this encounter  ?Medications  ? doxycycline (VIBRA-TABS) 100 MG tablet  ?  Sig: Take 1 tablet (100 mg total) by mouth 2 (two) times daily for 10 days.  ?  Dispense:  20 tablet  ?  Refill:  0  ?  Order Specific Question:   Supervising Provider  ?  Answer:   BEDSOLE, AMY E [[id number]]  ? ? ?Follow-up: Return in about 3 weeks (around 09/26/2021) for with pcp for ongoing weight gain, cellulitis.  ? ? ?Eugenia Pancoast, FNP ?

## 2021-09-05 NOTE — Assessment & Plan Note (Signed)
Thyroid panel workup  ?Increased hunger, advised pt to make sure she is watching her portion control.  ?

## 2021-09-05 NOTE — Assessment & Plan Note (Addendum)
Of left upper areola/breast.  ?Removed stitches with betadine as well as with scissors and tweezers, no foreign body afterwards.  ?Applied triple antbx ointment. Pt tolerated procedure well. Applied alcohol post as well. Pt to monitor site, will give rx doxycycline 100 mg to take, and apply bactroban twice daily monitor for worsening s/s infection. Keep updated with surgeon.  ? ?Also removed with scissors and tweezers two small sutures, white. Came out without issue. ?

## 2021-09-05 NOTE — Progress Notes (Signed)
Thyroid is stable doesn't explain weight gain.  ?Start antibiotic and see if improvement with swelling, work on calories (try to work on portions, since you said you're now always hungry). Follow up with [facility] if still continues to have issues.

## 2021-09-07 LAB — THYROID PEROXIDASE ANTIBODIES (TPO) (REFL): Thyroperoxidase Ab SerPl-aCnc: 1 IU/mL (ref <9)

## 2021-10-12 ENCOUNTER — Ambulatory Visit (INDEPENDENT_AMBULATORY_CARE_PROVIDER_SITE_OTHER): Payer: 59 | Admitting: Obstetrics and Gynecology

## 2021-10-12 ENCOUNTER — Encounter: Payer: Self-pay | Admitting: Obstetrics and Gynecology

## 2021-10-12 VITALS — BP 116/74

## 2021-10-12 DIAGNOSIS — B3731 Acute candidiasis of vulva and vagina: Secondary | ICD-10-CM | POA: Diagnosis not present

## 2021-10-12 DIAGNOSIS — Z113 Encounter for screening for infections with a predominantly sexual mode of transmission: Secondary | ICD-10-CM

## 2021-10-12 DIAGNOSIS — N760 Acute vaginitis: Secondary | ICD-10-CM | POA: Diagnosis not present

## 2021-10-12 LAB — WET PREP FOR TRICH, YEAST, CLUE

## 2021-10-12 MED ORDER — BETAMETHASONE VALERATE 0.1 % EX OINT
1.0000 | TOPICAL_OINTMENT | Freq: Two times a day (BID) | CUTANEOUS | 0 refills | Status: DC
Start: 2021-10-12 — End: 2021-11-23

## 2021-10-12 MED ORDER — FLUCONAZOLE 150 MG PO TABS: 150.0000 mg | ORAL_TABLET | Freq: Once | ORAL | 0 refills | Status: AC

## 2021-10-12 NOTE — Progress Notes (Signed)
See Dr Jertson's note 

## 2021-10-12 NOTE — Progress Notes (Signed)
GYNECOLOGY  VISIT   HPI: 36 y.o.   Single Black or African American Not Hispanic or Latino  female   [phone] with No LMP recorded. Patient has had an implant.   here for  vaginitis symptoms. She c/o a 2 day h/o a thick and clumpy vaginal d/c with itching. No odor.  She would like STD testing, she is using condoms. Condom disappeared a few days ago.   GYNECOLOGIC HISTORY: No LMP recorded. Patient has had an implant. Contraception: Nexplanon, condoms Menopausal hormone therapy: no        OB History     Gravida  5   Para  2   Term  2   Preterm      AB  3   Living  2      SAB  1   IAB  2   Ectopic      Multiple      Live Births  2              Patient Active Problem List   Diagnosis Date Noted   Weight gain 09/05/2021   S/P bilateral breast implants 08/02/2021   Puncture wound of back 08/02/2021   Cellulitis 08/02/2021   Finger joint swelling, right 07/30/2019   IBS (irritable colon syndrome) 06/29/2011    Past Medical History:  Diagnosis Date   ASCUS of cervix with negative high risk HPV 05/2016   Chicken pox    Elevated prolactin level    30 range 12/2008, normal X multiple repeats, most recent 18 04/2011   Fibroadenoma of breast    LEFT   IBS (irritable bowel syndrome)    LGSIL (low grade squamous intraepithelial dysplasia) 12/2008, 02/2011   C&B WITH LGSIL 02/2011   MRSA (methicillin resistant staph aureus) culture positive 04/2014   vulvar abscess   STD (sexually transmitted disease)    History of Chlamydia    Past Surgical History:  Procedure Laterality Date   ADENOIDECTOMY     ASA CHILD   BREAST BIOPSY Left 03/2014   CESAREAN SECTION N/A 03/06/2013   Procedure: CESAREAN SECTION;  Surgeon: Melina Schools, MD;  Location: [facility];  Service: Obstetrics;  Laterality: N/A;   COLPOSCOPY     DILATION AND CURETTAGE OF UTERUS     INDUCED ABORTION  2007,2008   x2   Nexplanon insertion  08/08/2016    Current Outpatient Medications  Medication  Sig Dispense Refill   etonogestrel (NEXPLANON) 68 MG IMPL implant 1 each by Subdermal route once.     No current facility-administered medications for this visit.     ALLERGIES: Banana and Flagyl [metronidazole]  Family History  Problem Relation Age of Onset   Aneurysm Mother    Other Brother        pre diabetic   Aneurysm Maternal Grandfather    Alcohol abuse Neg Hx    Arthritis Neg Hx    Asthma Neg Hx    Birth defects Neg Hx    COPD Neg Hx    Depression Neg Hx    Diabetes Neg Hx    Drug abuse Neg Hx    Early death Neg Hx    Hearing loss Neg Hx    Heart disease Neg Hx    Hyperlipidemia Neg Hx    Hypertension Neg Hx    Kidney disease Neg Hx    Learning disabilities Neg Hx    Mental illness Neg Hx    Mental retardation Neg Hx    Miscarriages /  Stillbirths Neg Hx    Vision loss Neg Hx    Varicose Veins Neg Hx     Social History   Socioeconomic History   Marital status: Single    Spouse name: Not on file   Number of children: 2   Years of education: Not on file   Highest education level: Not on file  Occupational History   Occupation: Therapist, 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 on some weekends 2 times a month   Drug use: Not Currently    Types: Marijuana   Sexual activity: Yes    Partners: Male    Birth control/protection: Implant  Other Topics Concern   Not on file  Social History Narrative   Fulltime: [employer]   Social Determinants of Health   Financial Resource Strain: Not on file  Food Insecurity: Not on file  Transportation Needs: Not on file  Physical Activity: Not on file  Stress: Not on file  Social Connections:  Not on file  Intimate Partner Violence: Not on file    ROS  PHYSICAL EXAMINATION:    BP 116/74     General appearance: alert, cooperative and appears stated age  Pelvic: External genitalia:  no lesions              Urethra:  normal appearing urethra with no masses, tenderness or lesions              Bartholins and Skenes: normal                 Vagina: condom was seen in the upper vagina and removed with ringed forceps. Slightly erythematous appearing vagina with a slight increase in thick, white vaginal d/c              Cervix: no lesions               Chaperone was present for exam.  1. Acute vaginitis - WET PREP FOR TRICH, YEAST, CLUE - betamethasone valerate ointment (VALISONE) 0.1 %; Apply 1 Application topically 2 (two) times daily.  Dispense: 30 g; Refill: 0  2. Yeast vaginitis - fluconazole (DIFLUCAN) 150 MG tablet; Take 1 tablet (150 mg total) by mouth once for 1 dose. Take one tablet.  Repeat in 72 hours if symptoms are not completely resolved.  Dispense: 2 tablet; Refill: 0  3. Screening examination for STD (sexually transmitted disease) Declines blood work - SURESWAB CT/NG/T. vaginalis

## 2021-10-12 NOTE — Patient Instructions (Signed)

## 2021-10-13 LAB — SURESWAB CT/NG/T. VAGINALIS
C. trachomatis RNA, TMA: NOT DETECTED
N. gonorrhoeae RNA, TMA: NOT DETECTED
Trichomonas vaginalis RNA: NOT DETECTED

## 2021-11-23 ENCOUNTER — Encounter: Payer: Self-pay | Admitting: Nurse Practitioner

## 2021-11-23 ENCOUNTER — Ambulatory Visit (INDEPENDENT_AMBULATORY_CARE_PROVIDER_SITE_OTHER): Payer: 59 | Admitting: Nurse Practitioner

## 2021-11-23 VITALS — BP 104/76 | HR 64 | Temp 96.9°F | Resp 10 | Ht 64.0 in | Wt 166.4 lb

## 2021-11-23 DIAGNOSIS — E663 Overweight: Secondary | ICD-10-CM | POA: Diagnosis not present

## 2021-11-23 DIAGNOSIS — Z0000 Encounter for general adult medical examination without abnormal findings: Secondary | ICD-10-CM | POA: Diagnosis not present

## 2021-11-23 DIAGNOSIS — Z8249 Family history of ischemic heart disease and other diseases of the circulatory system: Secondary | ICD-10-CM | POA: Diagnosis not present

## 2021-11-23 LAB — LIPID PANEL
Cholesterol: 186 mg/dL (ref 0–200)
HDL: 52.5 mg/dL (ref >39.00)
LDL Cholesterol: 119 mg/dL — ABNORMAL HIGH (ref 0–99)
NonHDL: 133.6
Total CHOL/HDL Ratio: 4
Triglycerides: 72 mg/dL (ref 0.0–149.0)
VLDL: 14.4 mg/dL (ref 0.0–40.0)

## 2021-11-23 LAB — CBC
HCT: 41.4 % (ref 36.0–46.0)
Hemoglobin: 13.4 g/dL (ref 12.0–15.0)
MCHC: 32.3 g/dL (ref 30.0–36.0)
MCV: 85.3 fl (ref 78.0–100.0)
Platelets: 279 10*3/uL (ref 150.0–400.0)
RBC: 4.85 Mil/uL (ref 3.87–5.11)
RDW: 15.9 % — ABNORMAL HIGH (ref 11.5–15.5)
WBC: 5.4 10*3/uL (ref 4.0–10.5)

## 2021-11-23 LAB — COMPREHENSIVE METABOLIC PANEL
ALT: 12 U/L (ref 0–35)
AST: 15 U/L (ref 0–37)
Albumin: 4.3 g/dL (ref 3.5–5.2)
Alkaline Phosphatase: 47 U/L (ref 39–117)
BUN: 11 mg/dL (ref 6–23)
CO2: 29 mEq/L (ref 19–32)
Calcium: 8.8 mg/dL (ref 8.4–10.5)
Chloride: 103 mEq/L (ref 96–112)
Creatinine, Ser: 0.92 mg/dL (ref 0.40–1.20)
GFR: 81.33 mL/min (ref >60.00)
Glucose, Bld: 89 mg/dL (ref 70–99)
Potassium: 4.4 mEq/L (ref 3.5–5.1)
Sodium: 139 mEq/L (ref 135–145)
Total Bilirubin: 0.3 mg/dL (ref 0.2–1.2)
Total Protein: 6.8 g/dL (ref 6.0–8.3)

## 2021-11-23 LAB — HEMOGLOBIN A1C: Hgb A1c MFr Bld: 6.4 % (ref 4.6–6.5)

## 2021-11-23 LAB — TSH: TSH: 0.75 u[IU]/mL (ref 0.35–5.50)

## 2021-11-23 NOTE — Progress Notes (Signed)
Established Patient Office Visit  Subjective   Patient ID: [name], female    DOB: April 04, 1986  Age: 36 y.o. MRN: [id number]  Chief Complaint  Patient presents with   Annual Exam    Has gyn    HPI for complete physical and follow up of chronic conditions.  Immunizations: -Tetanus:2014 -Influenza: Out of season -Covid-19:pfizer x 1 -Shingles: Too young -Pneumonia: Too young  -HPV: up to date   Diet: [place]. 2 meals a day and some snacking. Water. Maybe juice Exercise:  Tries daily at leat 4 times 30 mins up to three times a day  Eye exam: Every 2 years. Has glasses that are prn Dental exam: Completes semi-annually   Pap Smear: Completed in earlier this year.  Patient is followed by GYN Mammogram: Completed earlier this year for preop exam.  Continue mammograms at age 41.  Colonoscopy: Too young, currently average risk  lung Cancer Screening: N/A Dexa: Too young, currently average risk  Sleep: goes to sleep around 10-12 and get up 745. Feels rested. Does not snore  Cerebral aneurysm: Going over patient's family history of patient has maternal grandparents along with mother both with cerebral aneurysms patient has not been screened as of yet.  We did discuss this will place MRI brain without contrast for cerebral aneurysm screening.  States the grandparents did rupture mother's is just being monitored currently    Review of Systems  Constitutional:  Negative for chills, fever and malaise/fatigue.  Respiratory:  Negative for shortness of breath.   Cardiovascular:  Negative for chest pain and leg swelling.  Gastrointestinal:  Negative for abdominal pain, blood in stool, constipation, diarrhea, nausea and vomiting.       BM daily  Genitourinary:  Negative for dysuria and hematuria.  Neurological:  Negative for tingling and headaches.  Psychiatric/Behavioral:  Negative for hallucinations and suicidal ideas.       Objective:     BP 104/76   Pulse 64    Temp (!) 96.9 F (36.1 C)   Resp 10   Ht '5\' 4"'$  (1.626 m)   Wt 166 lb 6 oz (75.5 kg)   SpO2 98%   BMI 28.56 kg/m    Physical Exam Vitals and nursing note reviewed.  Constitutional:      Appearance: Normal appearance.  HENT:     Right Ear: Tympanic membrane, ear canal and external ear normal.     Left Ear: Tympanic membrane, ear canal and external ear normal.  Cardiovascular:     Rate and Rhythm: Normal rate and regular rhythm.     Pulses: Normal pulses.     Heart sounds: Normal heart sounds.  Pulmonary:     Effort: Pulmonary effort is normal.     Breath sounds: Normal breath sounds.  Abdominal:     General: Bowel sounds are normal. There is no distension.     Palpations: There is no mass.     Tenderness: There is no abdominal tenderness.     Hernia: No hernia is present.  Musculoskeletal:     Right lower leg: No edema.     Left lower leg: No edema.  Skin:    General: Skin is warm.  Neurological:     General: No focal deficit present.     Mental Status: She is alert.     Deep Tendon Reflexes:     Reflex Scores:      Bicep reflexes are 2+ on the right side and 2+ on the left  side.      Patellar reflexes are 2+ on the right side and 2+ on the left side.    Comments: Bilateral upper and lower extremity strength 5/5  Psychiatric:        Mood and Affect: Mood normal.        Behavior: Behavior normal.        Thought Content: Thought content normal.        Judgment: Judgment normal.      No results found for any visits on 11/23/21.    The ASCVD Risk score (Arnett DK, et al., 2019) failed to calculate for the following reasons:   The 2019 ASCVD risk score is only valid for ages 63 to 52    Assessment & Plan:   Problem List Items Addressed This Visit       Cardiovascular and Mediastinum   Family history of cerebral aneurysm    Maternal grandparent and mother both with cerebral aneurysms.  MRI without contrast placed today for cerebral aneurysm screening.       Relevant Orders   MR Brain Wo Contrast     Other   Preventative health care - Primary    Discussed age-appropriate immunizations and screening exams with patient.      Relevant Orders   CBC   Lipid panel   Comprehensive metabolic panel   Hemoglobin A1c   TSH   Overweight    Patient currently maintaining healthy lifestyle continue current lifestyle modifications.      Relevant Orders   Lipid panel   Hemoglobin A1c    Return in about 1 year (around 11/24/2022) for CPE and labs.    Romilda Garret, NP

## 2021-11-23 NOTE — Assessment & Plan Note (Signed)
Maternal grandparent and mother both with cerebral aneurysms.  MRI without contrast placed today for cerebral aneurysm screening.

## 2021-11-23 NOTE — Assessment & Plan Note (Signed)
Discussed age-appropriate immunizations and screening exams with patient.

## 2021-11-23 NOTE — Assessment & Plan Note (Signed)
Patient currently maintaining healthy lifestyle continue current lifestyle modifications.

## 2021-11-23 NOTE — Patient Instructions (Signed)
Nice to see you today I will be in touch with the labs once I have them I have placed the order for MRI. They will reach out to schedule once insurance approves the scan Follow up with me in 1 year for your next physical, sooner if you need me

## 2021-12-07 ENCOUNTER — Other Ambulatory Visit: Payer: 59

## 2021-12-17 ENCOUNTER — Other Ambulatory Visit: Payer: 59

## 2022-01-12 ENCOUNTER — Encounter: Payer: Self-pay | Admitting: Obstetrics & Gynecology

## 2022-01-12 ENCOUNTER — Other Ambulatory Visit (HOSPITAL_COMMUNITY)
Admission: RE | Admit: 2022-01-12 | Discharge: 2022-01-12 | Disposition: A | Discharge status: Home / Self Care | Payer: 59 | Source: Ambulatory Visit | Attending: Obstetrics & Gynecology | Admitting: Obstetrics & Gynecology

## 2022-01-12 ENCOUNTER — Ambulatory Visit (INDEPENDENT_AMBULATORY_CARE_PROVIDER_SITE_OTHER): Payer: 59 | Admitting: Obstetrics & Gynecology

## 2022-01-12 VITALS — BP 112/72 | HR 72 | Temp 99.0°F

## 2022-01-12 DIAGNOSIS — R87810 Cervical high risk human papillomavirus (HPV) DNA test positive: Secondary | ICD-10-CM | POA: Diagnosis present

## 2022-01-12 DIAGNOSIS — R102 Pelvic and perineal pain: Secondary | ICD-10-CM | POA: Diagnosis not present

## 2022-01-12 DIAGNOSIS — R87610 Atypical squamous cells of undetermined significance on cytologic smear of cervix (ASC-US): Secondary | ICD-10-CM | POA: Diagnosis not present

## 2022-01-12 DIAGNOSIS — N870 Mild cervical dysplasia: Secondary | ICD-10-CM | POA: Insufficient documentation

## 2022-01-12 DIAGNOSIS — Z3046 Encounter for surveillance of implantable subdermal contraceptive: Secondary | ICD-10-CM

## 2022-01-12 DIAGNOSIS — N941 Unspecified dyspareunia: Secondary | ICD-10-CM | POA: Diagnosis not present

## 2022-01-12 NOTE — Progress Notes (Signed)
    [place] 01-16-1986 [id number]        36 y.o.  [id number]   RP: Deep dyspareunia  HPI: Deep dyspareunia x 1 with new partner.  Used condoms.  "Rough sex" per patient.  Crampy x 2 hours post IC.  No current pain.  Well on Nexplanon with rare light menses.  Normal vaginal secretions.  Urine normal.  BMs normal.  No fever.  H/O ASCUS/HPV HR Pos in 09/2020.  Colpo 10/2020 Mild dysplasia/CIN1.  Late for repeat 6 month Pap.   OB History  Gravida Para Term Preterm AB Living  '5 2 2   3 2  '$ SAB IAB Ectopic Multiple Live Births  '1 2     2    '$ # Outcome Date GA Lbr Len/2nd Weight Sex Delivery Anes PTL Lv  5 Term 03/06/13 [redacted]w[redacted]d 8 lb 0.6 oz (3.645 kg) M CS-LTranv Spinal  LIV  4 SAB 06/07/10          3 IAB 2008          2 Term 2007 421w0d2:00 7 lb 9 oz (3.43 kg) F Vag-Spont EPI  LIV  1 IAB             Past medical history,surgical history, problem list, medications, allergies, family history and social history were all reviewed and documented in the EPIC chart.   Directed ROS with pertinent positives and negatives documented in the history of present illness/assessment and plan.  Exam:  Vitals:   01/12/22 0850  BP: 112/72  Pulse: 72  Temp: 99 F (37.2 C)  TempSrc: Oral  SpO2: 99%   General appearance:  Normal  Abdomen: Normal, soft, not distended  Gynecologic exam: Vulva normal.  Speculum:  Cervix/Vagina normal.  Pap/HPV HR/Gono-Chlam done.  Bimanual exam:  AV uterus, normal size, mobile, NT.  No adnexal mass, NT bilaterally.  U/A: Dark yellow, slightly cloudy, Nit Neg, Pro Neg, WBC 0-5, RBC Neg, Bacteria Few.  Pending U. Culture.   Assessment/Plan:  [age].o. G5P2A3L2   1. Dyspareunia in female Deep dyspareunia x 1 with new partner.  Used condoms.  Normal Gyn exam.  Condom use.  Counseling done on positional/deep dyspareunia.  2. Pelvic pain Normal Gyn exam today.  Pelvic Pain was just post IC, now resolved.  U/A only mildly perturbed, will wait on U. Culture to  decide if treatment is needed.  Gono-Chlam done on Pap. - Urinalysis,Complete w/RFL Culture  3. Mild dysplasia of cervix (CIN I) ASCUS/HPV HR Pos in 09/2020.  Colpo 10/2020 Mild Dysplasia/CIN 1.  Late for 6 month repeat Pap, Pap/HPV HR done today. - Cytology - PAP( [facility])  4. ASCUS with positive high risk HPV cervical - Cytology - PAP( [facility])  5. Surveillance of previously prescribed implantable subdermal contraceptive Well on Nexplanon.  Other orders - Urine Culture - REFLEXIVE URINE CULTURE   MaPrincess BruinsD, 9:10 AM 01/12/2022

## 2022-01-14 LAB — URINALYSIS, COMPLETE W/RFL CULTURE
Bilirubin Urine: NEGATIVE
Glucose, UA: NEGATIVE
Hgb urine dipstick: NEGATIVE
Hyaline Cast: NONE SEEN /LPF
Ketones, ur: NEGATIVE
Leukocyte Esterase: NEGATIVE
Nitrites, Initial: NEGATIVE
Protein, ur: NEGATIVE
RBC / HPF: NONE SEEN /HPF (ref 0–2)
Specific Gravity, Urine: 1.021 (ref 1.001–1.035)
pH: 5.5 (ref 5.0–8.0)

## 2022-01-14 LAB — URINE CULTURE
MICRO NUMBER:: 13949408
Result:: NO GROWTH
SPECIMEN QUALITY:: ADEQUATE

## 2022-01-14 LAB — CULTURE INDICATED

## 2022-01-16 LAB — CYTOLOGY - PAP
Chlamydia: NEGATIVE
Comment: NEGATIVE
Comment: NEGATIVE
Comment: NORMAL
High risk HPV: POSITIVE — AB
Neisseria Gonorrhea: NEGATIVE

## 2022-01-17 ENCOUNTER — Other Ambulatory Visit: Payer: Self-pay

## 2022-01-17 DIAGNOSIS — R87810 Cervical high risk human papillomavirus (HPV) DNA test positive: Secondary | ICD-10-CM

## 2022-01-17 DIAGNOSIS — R87612 Low grade squamous intraepithelial lesion on cytologic smear of cervix (LGSIL): Secondary | ICD-10-CM

## 2022-02-06 ENCOUNTER — Other Ambulatory Visit (HOSPITAL_COMMUNITY)
Admission: RE | Admit: 2022-02-06 | Discharge: 2022-02-06 | Disposition: A | Discharge status: Home / Self Care | Payer: 59 | Source: Ambulatory Visit | Attending: Obstetrics & Gynecology | Admitting: Obstetrics & Gynecology

## 2022-02-06 ENCOUNTER — Encounter: Payer: Self-pay | Admitting: Obstetrics & Gynecology

## 2022-02-06 ENCOUNTER — Ambulatory Visit (INDEPENDENT_AMBULATORY_CARE_PROVIDER_SITE_OTHER): Payer: 59 | Admitting: Obstetrics & Gynecology

## 2022-02-06 VITALS — BP 130/80

## 2022-02-06 DIAGNOSIS — R87612 Low grade squamous intraepithelial lesion on cytologic smear of cervix (LGSIL): Secondary | ICD-10-CM | POA: Insufficient documentation

## 2022-02-06 DIAGNOSIS — R87810 Cervical high risk human papillomavirus (HPV) DNA test positive: Secondary | ICD-10-CM

## 2022-02-06 DIAGNOSIS — N870 Mild cervical dysplasia: Secondary | ICD-10-CM

## 2022-02-06 NOTE — Progress Notes (Signed)
    [place] [birth year] [id number]        36 y.o.  [id number]   RP: LGSIL/HPV HR Pos for Colposcopy  HPI: ASCUS/HPV Pos 09/2020.  Colpo 10/2020 Mild dysplasia/CIN 1.  HPV 16-18-45 Neg.  Repeat Pap 01/12/22 LGSIL/HPV HR Pos.   OB History  Gravida Para Term Preterm AB Living  '5 2 2   3 2  '$ SAB IAB Ectopic Multiple Live Births  '1 2     2    '$ # Outcome Date GA Lbr Len/2nd Weight Sex Delivery Anes PTL Lv  5 Term 03/06/13 [redacted]w[redacted]d 8 lb 0.6 oz (3.645 kg) M CS-LTranv Spinal  LIV  4 SAB 06/07/10          3 IAB 2008          2 Term 2007 451w0d2:00 7 lb 9 oz (3.43 kg) F Vag-Spont EPI  LIV  1 IAB             Past medical history,surgical history, problem list, medications, allergies, family history and social history were all reviewed and documented in the EPIC chart.   Directed ROS with pertinent positives and negatives documented in the history of present illness/assessment and plan.  Exam:  Vitals:   02/06/22 0912  BP: 130/80   General appearance:  Normal  Colposcopy Procedure Note [place] Dirico 02/06/2022  Indications: Pap LGSIL/HPV HR POS 01/12/22/H/O Mild Dysplasia 10/2020 for Colpo  Procedure Details  The risks and benefits of the procedure and Written informed consent obtained.  Speculum placed in vagina and excellent visualization of cervix achieved, cervix swabbed x 3 with acetic acid solution.  Findings:  Cervix colposcopy: Physical Exam Genitourinary:       Vaginal colposcopy: Normal  Vulvar colposcopy: Normal  Perirectal colposcopy: Normal  The cervix was sprayed with Hurricane before performing the cervical biopsies.  Specimens: Cervical Bx at 3 O'Clock.  Complications: None, good hemostasis with Silver Nitrate . Plan: Management per cervical Bx results.   Assessment/Plan:  [age].o. [id number] 1. Papanicolaou smear of cervix with low grade squamous intraepithelial lesion (LGSIL) ASCUS/HPV Pos 09/2020.  Colpo 10/2020 Mild dysplasia/CIN 1.   HPV 16-18-45 Neg.  Repeat Pap 01/12/22 LGSIL/HPV HR Pos.  Counseling on HR HPV and abnormal Pap/Cervical Dysplasia.  Colposcopy findings reviewed.  Post procedure precautions.  Management per cervical Bx patho results. - Colposcopy  2. Cervical high risk HPV (human papillomavirus) test positive - Colposcopy  3. Mild dysplasia of cervix (CIN I)   MaPrincess BruinsD, 9:16 AM 02/06/2022

## 2022-02-08 LAB — SURGICAL PATHOLOGY

## 2022-06-02 ENCOUNTER — Encounter (HOSPITAL_COMMUNITY): Payer: Self-pay | Admitting: Emergency Medicine

## 2022-06-02 ENCOUNTER — Emergency Department (HOSPITAL_COMMUNITY)
Admission: EM | Admit: 2022-06-02 | Discharge: 2022-06-02 | Disposition: A | Discharge status: Home / Self Care | Payer: 59 | Attending: Emergency Medicine | Admitting: Emergency Medicine

## 2022-06-02 DIAGNOSIS — R519 Headache, unspecified: Secondary | ICD-10-CM

## 2022-06-02 DIAGNOSIS — Y92410 Unspecified street and highway as the place of occurrence of the external cause: Secondary | ICD-10-CM | POA: Diagnosis not present

## 2022-06-02 DIAGNOSIS — M25512 Pain in left shoulder: Secondary | ICD-10-CM | POA: Diagnosis not present

## 2022-06-02 MED ORDER — METHOCARBAMOL 500 MG PO TABS: 500.0000 mg | ORAL_TABLET | Freq: Two times a day (BID) | ORAL | 0 refills | Status: DC

## 2022-06-02 NOTE — Discharge Instructions (Addendum)
Please use Tylenol or ibuprofen for pain.  You may use 600 mg ibuprofen every 6 hours or 1000 mg of Tylenol every 6 hours.  You may choose to alternate between the 2.  This would be most effective.  Not to exceed 4 g of Tylenol within 24 hours.  Not to exceed 3200 mg ibuprofen 24 hours.  You can use the muscle relaxant I am prescribing in addition to the above to help with any breakthrough pain.  You can take it up to twice daily.  It is safe to take at night, but I would be cautious taking it during the day as it can cause some drowsiness.  Make sure that you are feeling awake and alert before you get behind the wheel of a car or operate a motor vehicle.  It is not a narcotic pain medication so you are able to take it if it is not making you drowsy and still pilot a vehicle or machinery safely.  I sent your muscle relaxant to the pharmacy on file.
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
## 2022-06-07 ENCOUNTER — Encounter: Payer: Self-pay | Admitting: Nurse Practitioner

## 2022-06-07 ENCOUNTER — Ambulatory Visit (INDEPENDENT_AMBULATORY_CARE_PROVIDER_SITE_OTHER)
Admission: RE | Admit: 2022-06-07 | Discharge: 2022-06-07 | Disposition: A | Discharge status: Home / Self Care | Payer: 59 | Source: Ambulatory Visit | Attending: Nurse Practitioner | Admitting: Nurse Practitioner

## 2022-06-07 ENCOUNTER — Ambulatory Visit (INDEPENDENT_AMBULATORY_CARE_PROVIDER_SITE_OTHER): Payer: 59 | Admitting: Nurse Practitioner

## 2022-06-07 DIAGNOSIS — M25512 Pain in left shoulder: Secondary | ICD-10-CM | POA: Diagnosis not present

## 2022-06-07 DIAGNOSIS — G44309 Post-traumatic headache, unspecified, not intractable: Secondary | ICD-10-CM

## 2022-06-07 MED ORDER — DEXAMETHASONE SODIUM PHOSPHATE 10 MG/ML IJ SOLN: 10.0000 mg | Freq: Once | INTRAMUSCULAR | Status: DC

## 2022-06-07 MED ORDER — NAPROXEN 500 MG PO TABS: 500.0000 mg | ORAL_TABLET | Freq: Two times a day (BID) | ORAL | 0 refills | Status: AC

## 2022-06-07 MED ORDER — DEXAMETHASONE SODIUM PHOSPHATE 10 MG/ML IJ SOLN
10.0000 mg | Freq: Once | INTRAMUSCULAR | Status: AC
  Administered 2022-06-07: 10 mg via INTRAMUSCULAR

## 2022-06-07 NOTE — Patient Instructions (Signed)
Nice to see you today I would try the muscle relaxer but it can make you sleepy. The injection can decrease the effectiveness of the birthcontrol so use back up contraception for a week Follow up if not improving

## 2022-06-07 NOTE — Assessment & Plan Note (Signed)
Patient was seen in the emergency department on 06/02/2022.  Did review the note.  No imaging done.

## 2022-06-07 NOTE — Assessment & Plan Note (Signed)
Do think she probably has some shoulder impingement.  Will do dexamethasone as she is having some radicular symptoms IM x 1 dose of 10 mg.  Naprosyn twice daily for 7 days take with food avoid other oral NSAIDs.  Will obtain film of left shoulder pending result.  If no improvement consider following up with Dr. Frederico Hamman [facility] sports medicine physician
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
## 2022-06-14 ENCOUNTER — Encounter: Payer: Self-pay | Admitting: Nurse Practitioner
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
## 2022-06-15 ENCOUNTER — Encounter: Payer: Self-pay | Admitting: Family Medicine

## 2022-06-15 ENCOUNTER — Ambulatory Visit (INDEPENDENT_AMBULATORY_CARE_PROVIDER_SITE_OTHER): Payer: 59 | Admitting: Family Medicine

## 2022-06-15 VITALS — BP 90/64 | HR 73 | Temp 98.2°F | Ht 64.0 in | Wt 168.5 lb

## 2022-06-15 DIAGNOSIS — S46812A Strain of other muscles, fascia and tendons at shoulder and upper arm level, left arm, initial encounter: Secondary | ICD-10-CM | POA: Diagnosis not present

## 2022-06-15 DIAGNOSIS — M25512 Pain in left shoulder: Secondary | ICD-10-CM | POA: Diagnosis not present

## 2022-06-15 MED ORDER — PREDNISONE 20 MG PO TABS: ORAL_TABLET | ORAL | 0 refills | Status: DC
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
## 2022-06-29 ENCOUNTER — Ambulatory Visit: Payer: 59 | Attending: Family Medicine

## 2022-06-29 ENCOUNTER — Other Ambulatory Visit: Payer: Self-pay

## 2022-06-29 DIAGNOSIS — S46812A Strain of other muscles, fascia and tendons at shoulder and upper arm level, left arm, initial encounter: Secondary | ICD-10-CM | POA: Diagnosis not present

## 2022-06-29 DIAGNOSIS — M25512 Pain in left shoulder: Secondary | ICD-10-CM | POA: Diagnosis present

## 2022-06-29 DIAGNOSIS — M6281 Muscle weakness (generalized): Secondary | ICD-10-CM | POA: Diagnosis present

## 2022-07-05 ENCOUNTER — Ambulatory Visit: Payer: 59

## 2022-07-05 DIAGNOSIS — M25512 Pain in left shoulder: Secondary | ICD-10-CM | POA: Diagnosis not present

## 2022-07-05 DIAGNOSIS — M6281 Muscle weakness (generalized): Secondary | ICD-10-CM
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
## 2022-07-14 ENCOUNTER — Other Ambulatory Visit: Payer: Self-pay

## 2022-07-14 ENCOUNTER — Encounter: Payer: Self-pay | Admitting: Physical Therapy

## 2022-07-14 ENCOUNTER — Ambulatory Visit: Payer: 59 | Admitting: Physical Therapy

## 2022-07-14 DIAGNOSIS — M6281 Muscle weakness (generalized): Secondary | ICD-10-CM

## 2022-07-14 DIAGNOSIS — M25512 Pain in left shoulder: Secondary | ICD-10-CM | POA: Diagnosis not present
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
## 2022-07-20 ENCOUNTER — Ambulatory Visit: Payer: 59

## 2022-07-27 ENCOUNTER — Ambulatory Visit: Payer: 59 | Attending: Family Medicine

## 2022-07-27 DIAGNOSIS — M6281 Muscle weakness (generalized): Secondary | ICD-10-CM | POA: Diagnosis present

## 2022-07-27 DIAGNOSIS — M25512 Pain in left shoulder: Secondary | ICD-10-CM | POA: Insufficient documentation
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
## 2022-08-03 ENCOUNTER — Ambulatory Visit: Payer: 59

## 2022-08-09 ENCOUNTER — Encounter: Payer: Self-pay | Admitting: Obstetrics & Gynecology

## 2022-08-09 ENCOUNTER — Other Ambulatory Visit (HOSPITAL_COMMUNITY)
Admission: RE | Admit: 2022-08-09 | Discharge: 2022-08-09 | Disposition: A | Discharge status: Home / Self Care | Payer: 59 | Source: Ambulatory Visit | Attending: Obstetrics & Gynecology | Admitting: Obstetrics & Gynecology

## 2022-08-09 ENCOUNTER — Ambulatory Visit (INDEPENDENT_AMBULATORY_CARE_PROVIDER_SITE_OTHER): Payer: 59 | Admitting: Obstetrics & Gynecology

## 2022-08-09 ENCOUNTER — Other Ambulatory Visit (HOSPITAL_COMMUNITY): Admission: RE | Admit: 2022-08-09 | Payer: 59 | Source: Ambulatory Visit

## 2022-08-09 VITALS — BP 102/64 | HR 68

## 2022-08-09 DIAGNOSIS — Z113 Encounter for screening for infections with a predominantly sexual mode of transmission: Secondary | ICD-10-CM

## 2022-08-09 DIAGNOSIS — N870 Mild cervical dysplasia: Secondary | ICD-10-CM

## 2022-08-09 DIAGNOSIS — R87810 Cervical high risk human papillomavirus (HPV) DNA test positive: Secondary | ICD-10-CM | POA: Diagnosis present

## 2022-08-09 NOTE — Progress Notes (Signed)
    [name] [birth year] [id number]        37 y.o.  [id number]   RP: Mild dysplasia for repeat Pap at 6 months  HPI: ASCUS/HPV Pos 09/2020. Colpo 10/2020 Mild dysplasia/CIN 1. HPV 16-18-45 Neg.  Repeat Pap 01/12/22 LGSIL/HPV HR Pos.  Colpo 01/2022 Mild dysplasia (CIN1).   OB History  Gravida Para Term Preterm AB Living  SAB IAB Ectopic Multiple Live Births  # Outcome Date GA Lbr Len/2nd Weight Sex Delivery Anes PTL Lv  5 Term 03/06/13 [redacted]w[redacted]d  8 lb 0.6 oz (3.645 kg) M CS-LTranv Spinal  LIV  4 SAB 06/07/10          3 IAB 2008          2 Term 2007 [redacted]w[redacted]d 12:00 7 lb 9 oz (3.43 kg) F Vag-Spont EPI  LIV  1 IAB             Past medical history,surgical history, problem list, medications, allergies, family history and social history were all reviewed and documented in the EPIC chart.   Directed ROS with pertinent positives and negatives documented in the history of present illness/assessment and plan.  Exam:  Vitals:   08/09/22 1643  BP: 102/64  Pulse: 68  SpO2: 99%   General appearance:  Normal  Gynecologic exam: Vulva Normal.  Speculum:  Cervix/Vagina normal.  No blood.  Normal secretions.  Pap/HPV HR, Gono-Chlam done.   Assessment/Plan:  36 y.o. [id number]   1. Mild dysplasia of cervix (CIN I) ASCUS/HPV Pos 09/2020. Colpo 10/2020 Mild dysplasia/CIN 1. HPV 16-18-45 Neg.  Repeat Pap 01/12/22 LGSIL/HPV HR Pos.  Colpo 01/2022 Mild dysplasia (CIN1).  Pap/HPV HR, Gono-Chlam done today.  Management per results. - Cytology - PAP( [facility])  2. Cervical high risk HPV (human papillomavirus) test positive - Cytology - PAP( [facility])  3. Screening examination for STD (sexually transmitted disease)  Gono-Chlam on Pap.  Genia Del MD, 4:48 PM 08/09/2022

## 2022-08-10 ENCOUNTER — Ambulatory Visit: Payer: 59

## 2022-08-14 LAB — CYTOLOGY - PAP
Chlamydia: NEGATIVE
Comment: NEGATIVE
Comment: NEGATIVE
Comment: NORMAL
Diagnosis: UNDETERMINED — AB
High risk HPV: NEGATIVE
Neisseria Gonorrhea: NEGATIVE

## 2022-08-17 ENCOUNTER — Ambulatory Visit: Payer: 59

## 2022-08-17 DIAGNOSIS — M25512 Pain in left shoulder: Secondary | ICD-10-CM

## 2022-08-17 DIAGNOSIS — M6281 Muscle weakness (generalized): Secondary | ICD-10-CM
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
## 2022-08-24 ENCOUNTER — Ambulatory Visit: Payer: 59 | Attending: Family Medicine

## 2022-08-24 DIAGNOSIS — M25512 Pain in left shoulder: Secondary | ICD-10-CM | POA: Diagnosis present

## 2022-08-24 DIAGNOSIS — M6281 Muscle weakness (generalized): Secondary | ICD-10-CM | POA: Diagnosis present
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
## 2022-09-07 ENCOUNTER — Ambulatory Visit: Payer: 59

## 2022-09-07 DIAGNOSIS — M25512 Pain in left shoulder: Secondary | ICD-10-CM

## 2022-09-07 DIAGNOSIS — M6281 Muscle weakness (generalized): Secondary | ICD-10-CM
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
## 2022-09-14 ENCOUNTER — Ambulatory Visit: Payer: 59

## 2022-09-14 DIAGNOSIS — M25512 Pain in left shoulder: Secondary | ICD-10-CM

## 2022-09-14 DIAGNOSIS — M6281 Muscle weakness (generalized): Secondary | ICD-10-CM
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
## 2022-09-21 ENCOUNTER — Ambulatory Visit: Payer: 59

## 2022-09-21 DIAGNOSIS — M25512 Pain in left shoulder: Secondary | ICD-10-CM | POA: Diagnosis not present

## 2022-09-21 DIAGNOSIS — M6281 Muscle weakness (generalized): Secondary | ICD-10-CM
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
## 2022-11-15 ENCOUNTER — Encounter: Payer: Self-pay | Admitting: Nurse Practitioner

## 2022-11-15 ENCOUNTER — Ambulatory Visit (INDEPENDENT_AMBULATORY_CARE_PROVIDER_SITE_OTHER): Payer: 59 | Admitting: Nurse Practitioner

## 2022-11-15 VITALS — BP 102/80 | HR 76 | Temp 98.8°F | Ht 64.0 in | Wt 171.2 lb

## 2022-11-15 DIAGNOSIS — L089 Local infection of the skin and subcutaneous tissue, unspecified: Secondary | ICD-10-CM | POA: Insufficient documentation

## 2022-11-15 MED ORDER — SULFAMETHOXAZOLE-TRIMETHOPRIM 800-160 MG PO TABS: 1.0000 | ORAL_TABLET | Freq: Two times a day (BID) | ORAL | 0 refills | Status: DC

## 2022-11-15 NOTE — Patient Instructions (Addendum)
Nice to see you today I will be in touch with the wound culture once I have it I have sent antibiotics to the pharmacy  Follow up if no improvement  You are due for your physical next month schedule within ht next month or two

## 2022-11-15 NOTE — Assessment & Plan Note (Signed)
Unsure of etiology.  No intravenous use of anything no recent tattoos will treat patient with Bactrim DS 1 tab twice daily for 5 days.  Did unroofed 1 in office with a needle to send off for culture pending result

## 2022-11-15 NOTE — Progress Notes (Signed)
   Acute Office Visit  Subjective:     Patient ID: [name], female    DOB: January 17, 1986, 37 y.o.   MRN: [id number]  Chief Complaint  Patient presents with   leg bites    Bites on left leg started a week ago. Sore and itchy. Pt states that she has a scar on left leg that was infected. Pt used OTC hydrocortisone cream.     HPI Patient is in today for insect bites  States started 1.5-2 weeks ago. States that they started out with the itching and then started developing pus and they will get red and tender they also itch. States that she did try hydorcortisone for the itch and does well   Review of Systems  Constitutional:  Negative for chills and fever.  Respiratory:  Negative for shortness of breath.   Cardiovascular:  Negative for chest pain.  Skin:  Positive for itching and rash.        Objective:    BP 102/80   Pulse 76   Temp 98.8 F (37.1 C) (Temporal)   Ht 5\' 4"  (1.626 m)   Wt 171 lb 3.2 oz (77.7 kg)   LMP 07/24/2022 (Approximate)   SpO2 99%   BMI 29.39 kg/m    Physical Exam Skin:    Findings: Rash present.     No results found for any visits on 11/15/22.      Assessment & Plan:   Problem List Items Addressed This Visit       Musculoskeletal and Integument   Pustules determined by examination - Primary    Unsure of etiology.  No intravenous use of anything no recent tattoos will treat patient with Bactrim DS 1 tab twice daily for 5 days.  Did unroofed 1 in office with a needle to send off for culture pending result      Relevant Medications   sulfamethoxazole-trimethoprim (BACTRIM DS) 800-160 MG tablet   Other Relevant Orders   WOUND CULTURE    Meds ordered this encounter  Medications   sulfamethoxazole-trimethoprim (BACTRIM DS) 800-160 MG tablet    Sig: Take 1 tablet by mouth 2 (two) times daily.    Dispense:  10 tablet    Refill:  0    Order Specific Question:   Supervising Provider    Answer:   Roxy Manns A [[id number]]     Return in about 2 months (around 01/16/2023) for CPE and Labs.  Audria Nine, NP

## 2022-12-21 ENCOUNTER — Encounter: Payer: Self-pay | Admitting: Nurse Practitioner

## 2022-12-21 DIAGNOSIS — L089 Local infection of the skin and subcutaneous tissue, unspecified: Secondary | ICD-10-CM

## 2022-12-22 MED ORDER — SULFAMETHOXAZOLE-TRIMETHOPRIM 800-160 MG PO TABS: 1.0000 | ORAL_TABLET | Freq: Two times a day (BID) | ORAL | 0 refills | Status: DC

## 2023-01-11 ENCOUNTER — Ambulatory Visit (INDEPENDENT_AMBULATORY_CARE_PROVIDER_SITE_OTHER): Payer: 59 | Admitting: Radiology

## 2023-01-11 ENCOUNTER — Other Ambulatory Visit (HOSPITAL_COMMUNITY)
Admission: RE | Admit: 2023-01-11 | Discharge: 2023-01-11 | Disposition: A | Discharge status: Home / Self Care | Payer: 59 | Source: Ambulatory Visit | Attending: Radiology | Admitting: Radiology

## 2023-01-11 ENCOUNTER — Encounter: Payer: Self-pay | Admitting: Radiology

## 2023-01-11 VITALS — BP 116/78 | Ht 64.0 in | Wt 169.0 lb

## 2023-01-11 DIAGNOSIS — N870 Mild cervical dysplasia: Secondary | ICD-10-CM

## 2023-01-11 DIAGNOSIS — Z01419 Encounter for gynecological examination (general) (routine) without abnormal findings: Secondary | ICD-10-CM

## 2023-01-11 DIAGNOSIS — Z113 Encounter for screening for infections with a predominantly sexual mode of transmission: Secondary | ICD-10-CM | POA: Insufficient documentation

## 2023-01-11 NOTE — Progress Notes (Signed)
[name] 1985/11/18 [id number]   History:  37 y.o. G5P2 presents for annual exam. No gyn concerns. Happy with Nexplanon. Hx of CIN-1, last pap ASCUS.   Gynecologic History Patient's last menstrual period was 01/07/2023 (exact date). Period Cycle (Days):  (irregular with Nexplanon) Period Pattern: (!) Irregular Menstrual Flow: Light, Moderate Menstrual Control: Maxi pad, Thin pad Dysmenorrhea: (!) Moderate Dysmenorrhea Symptoms: Cramping Contraception/Family planning: Nexplanon Sexually active: yes Last Pap: 10/23. Results were: abnormal   Obstetric History OB History  Gravida Para Term Preterm AB Living  5 2 2   3 2   SAB IAB Ectopic Multiple Live Births  1 2     2     # Outcome Date GA Lbr Len/2nd Weight Sex Type Anes PTL Lv  5 Term 03/06/13 [redacted]w[redacted]d  8 lb 0.6 oz (3.645 kg) M CS-LTranv Spinal  LIV  4 SAB 06/07/10          3 IAB 2008          2 Term 2007 [redacted]w[redacted]d 12:00 7 lb 9 oz (3.43 kg) F Vag-Spont EPI  LIV  1 IAB              The following portions of the patient's history were reviewed and updated as appropriate: allergies, current medications, past family history, past medical history, past social history, past surgical history, and problem list.  Review of Systems Pertinent items noted in HPI and remainder of comprehensive ROS otherwise negative.   Past medical history, past surgical history, family history and social history were all reviewed and documented in the EPIC chart.   Exam:  Vitals:   01/11/23 1322  BP: 116/78  Weight: 169 lb (76.7 kg)  Height: 5\' 4"  (1.626 m)   Body mass index is 29.01 kg/m.  General appearance:  Normal Thyroid:  Symmetrical, normal in size, without palpable masses or nodularity. Respiratory  Auscultation:  Clear without wheezing or rhonchi Cardiovascular  Auscultation:  Regular rate, without rubs, murmurs or gallops  Edema/varicosities:  Not grossly evident Abdominal  Soft,nontender, without masses, guarding or  rebound.  Liver/spleen:  No organomegaly noted  Hernia:  None appreciated  Skin  Inspection:  Grossly normal Breasts: Examined lying and sitting.   Right: Without masses, retractions, nipple discharge or axillary adenopathy.   Left: Without masses, retractions, nipple discharge or axillary adenopathy. Genitourinary   Inguinal/mons:  Normal without inguinal adenopathy  External genitalia:  Normal appearing vulva with no masses, tenderness, or lesions  BUS/Urethra/Skene's glands:  Normal without masses or exudate  Vagina:  Normal appearing with normal color and discharge, no lesions  Cervix:  Normal appearing without discharge or lesions  Uterus:  Normal in size, shape and contour.  Mobile, nontender  Adnexa/parametria:     Rt: Normal in size, without masses or tenderness.   Lt: Normal in size, without masses or tenderness.  Anus and perineum: Normal   Raynelle Fanning, CMA present for exam  Assessment/Plan:   1. Well woman exam with routine gynecological exam - Cytology - PAP( [place]) - Nexplanon due to be removed 01/2024 - Mammo at 42 - Labs with PCP  2. Dysplasia of cervix, low grade (CIN 1) - Cytology - PAP( [place])  3. Routine screening for STI (sexually transmitted infection) - Cytology - PAP( [place])     Discussed SBE, colonoscopy and DEXA screening as directed/appropriate. Recommend of exercise weekly, including weight bearing exercise. Encouraged the use of seatbelts and sunscreen. Return in 1 year for annual  or as needed.   Arlie Solomons B WHNP-BC 1:43 PM 01/11/2023

## 2023-01-17 LAB — CYTOLOGY - PAP
Adequacy: ABSENT
Chlamydia: NEGATIVE
Comment: NEGATIVE
Comment: NEGATIVE
Comment: NEGATIVE
Comment: NORMAL
Diagnosis: UNDETERMINED — AB
High risk HPV: NEGATIVE
Neisseria Gonorrhea: NEGATIVE
Trichomonas: NEGATIVE

## 2023-01-25 ENCOUNTER — Telehealth: Payer: Self-pay | Admitting: Nurse Practitioner

## 2023-01-25 NOTE — Telephone Encounter (Signed)
Patient called in and stated that she is having a hard time sleeping. She stated that she use to take something to help and wanted to know if Susy Frizzle could call that in for her. Thank you!

## 2023-03-21 ENCOUNTER — Ambulatory Visit: Payer: 59 | Admitting: Nurse Practitioner

## 2023-03-21 ENCOUNTER — Encounter: Payer: Self-pay | Admitting: Nurse Practitioner

## 2023-03-21 ENCOUNTER — Ambulatory Visit
Admission: RE | Admit: 2023-03-21 | Discharge: 2023-03-21 | Disposition: A | Discharge status: Home / Self Care | Payer: 59 | Source: Ambulatory Visit | Attending: Nurse Practitioner | Admitting: Nurse Practitioner

## 2023-03-21 VITALS — BP 118/86 | HR 76 | Temp 98.6°F | Ht 64.0 in | Wt 169.4 lb

## 2023-03-21 DIAGNOSIS — R5383 Other fatigue: Secondary | ICD-10-CM | POA: Insufficient documentation

## 2023-03-21 DIAGNOSIS — G479 Sleep disorder, unspecified: Secondary | ICD-10-CM

## 2023-03-21 DIAGNOSIS — G8929 Other chronic pain: Secondary | ICD-10-CM

## 2023-03-21 DIAGNOSIS — M25562 Pain in left knee: Secondary | ICD-10-CM | POA: Diagnosis not present

## 2023-03-21 LAB — COMPREHENSIVE METABOLIC PANEL
ALT: 10 U/L (ref 0–35)
AST: 13 U/L (ref 0–37)
Albumin: 4.5 g/dL (ref 3.5–5.2)
Alkaline Phosphatase: 50 U/L (ref 39–117)
BUN: 10 mg/dL (ref 6–23)
CO2: 27 meq/L (ref 19–32)
Calcium: 9.1 mg/dL (ref 8.4–10.5)
Chloride: 106 meq/L (ref 96–112)
Creatinine, Ser: 0.9 mg/dL (ref 0.40–1.20)
GFR: 82.74 mL/min (ref >60.00)
Glucose, Bld: 93 mg/dL (ref 70–99)
Potassium: 4.1 meq/L (ref 3.5–5.1)
Sodium: 139 meq/L (ref 135–145)
Total Bilirubin: 0.8 mg/dL (ref 0.2–1.2)
Total Protein: 6.9 g/dL (ref 6.0–8.3)

## 2023-03-21 LAB — CBC
HCT: 43.7 % (ref 36.0–46.0)
Hemoglobin: 14.2 g/dL (ref 12.0–15.0)
MCHC: 32.4 g/dL (ref 30.0–36.0)
MCV: 88.2 fL (ref 78.0–100.0)
Platelets: 292 10*3/uL (ref 150.0–400.0)
RBC: 4.96 Mil/uL (ref 3.87–5.11)
RDW: 15 % (ref 11.5–15.5)
WBC: 6.2 10*3/uL (ref 4.0–10.5)

## 2023-03-21 LAB — TSH: TSH: 0.6 u[IU]/mL (ref 0.35–5.50)

## 2023-03-21 LAB — VITAMIN D 25 HYDROXY (VIT D DEFICIENCY, FRACTURES): VITD: 10.76 ng/mL — ABNORMAL LOW (ref 30.00–100.00)

## 2023-03-21 LAB — VITAMIN B12: Vitamin B-12: 198 pg/mL — ABNORMAL LOW (ref 211–911)

## 2023-03-21 MED ORDER — HYDROXYZINE PAMOATE 25 MG PO CAPS: 25.0000 mg | ORAL_CAPSULE | Freq: Every evening | ORAL | 0 refills | Status: DC | PRN

## 2023-03-21 MED ORDER — NAPROXEN 500 MG PO TABS: 500.0000 mg | ORAL_TABLET | Freq: Two times a day (BID) | ORAL | 0 refills | Status: DC

## 2023-03-21 NOTE — Assessment & Plan Note (Signed)
Ambiguous could be related to poor sleep will check CBC CMP B12 vitamin D and TSH today.

## 2023-03-21 NOTE — Patient Instructions (Signed)
Nice to see you toyda Take the naproxen for the next 5-7 days as prescribed then go to as needed thereafter The hydroxyzine can cause sedation so use caution  Follow up if not improving

## 2023-03-21 NOTE — Assessment & Plan Note (Signed)
Discontinue over-the-counter sleep aids will try hydroxyzine 25 mg nightly as needed.  Sedation precautions reviewed
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
## 2023-03-21 NOTE — Assessment & Plan Note (Signed)
Anti-inflammatories on schedule for the next 5 to 7 days with food.  RICE therapy pending x-ray today

## 2023-03-26 ENCOUNTER — Other Ambulatory Visit: Payer: Self-pay | Admitting: Nurse Practitioner

## 2023-03-26 DIAGNOSIS — E559 Vitamin D deficiency, unspecified: Secondary | ICD-10-CM

## 2023-03-26 DIAGNOSIS — E538 Deficiency of other specified B group vitamins: Secondary | ICD-10-CM

## 2023-03-26 MED ORDER — VITAMIN D (ERGOCALCIFEROL) 1.25 MG (50000 UNIT) PO CAPS: 50000.0000 [IU] | ORAL_CAPSULE | ORAL | 0 refills | Status: DC

## 2023-04-27 ENCOUNTER — Other Ambulatory Visit: Payer: 59

## 2023-04-27 ENCOUNTER — Other Ambulatory Visit (INDEPENDENT_AMBULATORY_CARE_PROVIDER_SITE_OTHER): Payer: 59

## 2023-04-27 DIAGNOSIS — E538 Deficiency of other specified B group vitamins: Secondary | ICD-10-CM

## 2023-04-27 LAB — VITAMIN B12: Vitamin B-12: 728 pg/mL (ref 211–911)

## 2023-05-03 ENCOUNTER — Other Ambulatory Visit: Payer: Self-pay | Admitting: Nurse Practitioner

## 2023-05-03 ENCOUNTER — Encounter: Payer: Self-pay | Admitting: Nurse Practitioner

## 2023-05-03 DIAGNOSIS — G8929 Other chronic pain: Secondary | ICD-10-CM

## 2023-05-03 DIAGNOSIS — G479 Sleep disorder, unspecified: Secondary | ICD-10-CM

## 2023-06-22 ENCOUNTER — Ambulatory Visit: Payer: Self-pay | Admitting: Nurse Practitioner

## 2023-06-22 DIAGNOSIS — E559 Vitamin D deficiency, unspecified: Secondary | ICD-10-CM

## 2023-06-22 NOTE — Telephone Encounter (Signed)
  Chief Complaint: left knee pain- getting worse- PCP advised if not better - call for appointment with [facility] Symptoms: left knee pain- pain travels into hip and sometime down the leg Frequency: ongoing- not sure if related to previous injury- but has gotten to the point it is bothersome Pertinent Negatives: Patient denies swelling in knee Disposition: [] ED /[] Urgent Care (no appt availability in office) / [x] Appointment(In office/virtual)/ []  [facility]/ [] Home Care/ [] Refused Recommended Disposition /[] [facility]/ []  Follow-up with PCP Additional Notes: Call to office- may schedule with provider requested    Copied from CRM [id number]. Topic: Clinical - Red Word Triage >> Jun 22, 2023  9:02 AM Kathryne Eriksson wrote: Red Word that prompted transfer to Nurse Triage: Worsening Pain Left Knee >> Jun 22, 2023  9:03 AM Kathryne Eriksson wrote: Patient states she was advised by [facility] that if her pain began to get worse then to call the office back and schedule with Dr.Copeland  Reason for Disposition  Knee pain is a chronic symptom (recurrent or ongoing AND present > 4 weeks)  Answer Assessment - Initial Assessment Questions 1. LOCATION and RADIATION: "Where is the pain located?"      Left knee- pain in hip to knee 2. QUALITY: "What does the pain feel like?"  (e.g., sharp, dull, aching, burning)     Aching pain 3. SEVERITY: "How bad is the pain?" "What does it keep you from doing?"   (Scale 1-10; or mild, moderate, severe)   -  MILD (1-3): doesn't interfere with normal activities    -  MODERATE (4-7): interferes with normal activities (e.g., work or school) or awakens from sleep, limping    -  SEVERE (8-10): excruciating pain, unable to do any normal activities, unable to walk     4/10- can get 8/10 when walking 4. ONSET: "When did the pain start?" "Does it come and go, or is it there all the time?"     Injury 11/11/22- not sure if related 5. RECURRENT: "Have you had this  pain before?" If Yes, ask: "When, and what happened then?"     Pain has been coming and going  7. AGGRAVATING FACTORS: "What makes the knee pain worse?" (e.g., walking, climbing stairs, running)     More pain when up and moving around  Protocols used: Knee Pain-A-AH

## 2023-06-22 NOTE — Telephone Encounter (Signed)
 Noted. I appreciate Dr Brayton Layman evaluation

## 2023-06-28 ENCOUNTER — Ambulatory Visit (INDEPENDENT_AMBULATORY_CARE_PROVIDER_SITE_OTHER): Payer: 59 | Admitting: Family Medicine

## 2023-06-28 ENCOUNTER — Encounter: Payer: Self-pay | Admitting: Family Medicine

## 2023-06-28 VITALS — BP 90/70 | HR 70 | Temp 97.5°F | Ht 64.0 in | Wt 172.1 lb

## 2023-06-28 DIAGNOSIS — S86899A Other injury of other muscle(s) and tendon(s) at lower leg level, unspecified leg, initial encounter: Secondary | ICD-10-CM

## 2023-06-28 DIAGNOSIS — M67952 Unspecified disorder of synovium and tendon, left thigh: Secondary | ICD-10-CM

## 2023-06-28 DIAGNOSIS — M7050 Other bursitis of knee, unspecified knee: Secondary | ICD-10-CM

## 2023-06-28 DIAGNOSIS — M25562 Pain in left knee: Secondary | ICD-10-CM

## 2023-06-28 DIAGNOSIS — M7062 Trochanteric bursitis, left hip: Secondary | ICD-10-CM

## 2023-06-28 DIAGNOSIS — G8929 Other chronic pain: Secondary | ICD-10-CM

## 2023-06-28 MED ORDER — PREDNISONE 20 MG PO TABS: ORAL_TABLET | ORAL | 0 refills | Status: DC

## 2023-06-28 MED ORDER — NAPROXEN 500 MG PO TABS: 500.0000 mg | ORAL_TABLET | Freq: Two times a day (BID) | ORAL | 2 refills | Status: DC

## 2023-06-28 NOTE — Telephone Encounter (Signed)
 Patient was in office today seeing Dr. Patsy Lager.  She is asking for refills on the Vit D.  She states she wasn't sure how long Matt wanted her to take it.  Also she is asking for increase dose of hydroxyzine.  She states the 25 mg is not helping her sleep and she is having to also take Melatonin with it.  Please advise patient.

## 2023-06-28 NOTE — Progress Notes (Signed)
 Sebastian Lurz T. Shreyansh Tiffany, MD, CAQ Sports Medicine [facility] at [facility] [address] [place], [address]  Phone: [phone]  FAX: [phone]  [name] - 38 y.o. female  MRN [id number]  Date of Birth: 1985-06-30  Date: 06/28/2023  PCP: Eden Emms, NP  Referral: Eden Emms, NP  Chief Complaint  Patient presents with   Knee Pain    Left   Subjective:   [name] is a 38 y.o. very pleasant female patient with Body mass index is 29.55 kg/m. who presents with the following:  Patient presents with some ongoing left-sided knee pain.  Left-sided knee x-rays from November 2024 were independent reviewed by myself.  This was a nonweightbearing trauma/complete view of the knee.  On the sunrise view, the patient does have a significant laterally deviated patella in the patellar groove.  Last July she did have a knee abrasion, and she had some pain from this ultimately did get a MRSA infection.  Right now she is having pain in the anterior knee, medial knee, right calf, medial tibial area along with pain in the lateral hip.  Area of maximal tenderness right now is in the knee.  She is intermittently living right now and she is still having significant pain despite taking ibuprofen 800 mg p.o. 3 times daily over the last 2 weeks.  Limping last week, and then on the lateral hip    Review of Systems is noted in the HPI, as appropriate  Objective:   BP 90/70 (BP Location: Left Arm, Patient Position: Sitting, Cuff Size: Large)   Pulse 70   Temp (!) 97.5 F (36.4 C) (Temporal)   Ht 5\' 4"  (1.626 m)   Wt 172 lb 2 oz (78.1 kg)   SpO2 99%   BMI 29.55 kg/m   GEN: No acute distress; alert,appropriate. PULM: Breathing comfortably in no respiratory distress PSYCH: Normally interactive.   Left knee: Full extension and flexion to 130 She does have some mild pain with loading the medial lateral patellar facets She does have significant  medial joint line tenderness, more posteromedial Stable to varus and valgus stress Lachman and drawer testing is negative She does have significant pain at the anserine bursa McMurray's test is equivocal, does cause some pain with no mechanical symptoms. Bounce home testing and flexion pinch testing are normal.  She does have some tenderness along the medial tibia and to a lesser extent in the medial gastrocnemius on the left She also has some pain at the inflammation as well on the left as well as just caudal to this at the gluteus medius minimus insertion She also has full range of motion of the hip in all directions with minimal to no pain with terminal motion Strength is also quite good at 5/5  Laboratory and Imaging Data:  Assessment and Plan:     ICD-10-CM   1. Chronic pain of left knee  M25.562 naproxen (NAPROSYN) 500 MG tablet   G89.29     2. Pes anserine bursitis  M70.50     3. Anterior shin splints  S86.899A     4. Trochanteric bursitis of left hip  M70.62     5. Tendinopathy of left gluteus medius  M67.952      Acute on chronic knee pain on the left side with exacerbation particularly worsened over the last 2 weeks.  She has multipart source of pain including joint pain, more medial and posteromedial.  Most likely location given  this anatomically would be a posterior medial meniscal tear.  She also has some significant anserine bursitis, medial tibial stress syndrome, trochanteric bursitis as well as some tenderness at the gluteus medius and minimus insertion.  At this point I think doing some systemic steroids makes the miss since given multiple areas of pain.  I think this is all likely secondary to knee pain, as the primary driver of pain.  Lateral deviated patella on sunrise view would increase risk of patellofemoral pain syndrome and chondromalacia patella.  Medication Management during today's office visit: Meds ordered this encounter  Medications   naproxen  (NAPROSYN) 500 MG tablet    Sig: Take 1 tablet (500 mg total) by mouth 2 (two) times daily with a meal.    Dispense:  60 tablet    Refill:  2   predniSONE (DELTASONE) 20 MG tablet    Sig: 2 tabs po daily for 5 days, then 1 tab po daily for 5 days    Dispense:  15 tablet    Refill:  0   Medications Discontinued During This Encounter  Medication Reason   naproxen (NAPROSYN) 500 MG tablet Reorder    Orders placed today for conditions managed today: No orders of the defined types were placed in this encounter.   Disposition: No follow-ups on file.  [employer] One speech-to-text software was used for transcription in this dictation.  Possible transcriptional errors can occur using Animal nutritionist.   Signed,  Elpidio Galea. Renesme Kerrigan, MD   Outpatient Encounter Medications as of 06/28/2023  Medication Sig   etonogestrel (NEXPLANON) 68 MG IMPL implant 1 each by Subdermal route once.   hydrOXYzine (VISTARIL) 25 MG capsule TAKE 1 CAPSULE(25 MG) BY MOUTH AT BEDTIME AS NEEDED   predniSONE (DELTASONE) 20 MG tablet 2 tabs po daily for 5 days, then 1 tab po daily for 5 days   Vitamin D, Ergocalciferol, (DRISDOL) 1.25 MG (50000 UNIT) CAPS capsule Take 1 capsule (50,000 Units total) by mouth every 7 (seven) days.   [DISCONTINUED] naproxen (NAPROSYN) 500 MG tablet TAKE 1 TABLET(500 MG) BY MOUTH TWICE DAILY WITH A MEAL   naproxen (NAPROSYN) 500 MG tablet Take 1 tablet (500 mg total) by mouth 2 (two) times daily with a meal.   No facility-administered encounter medications on file as of 06/28/2023.

## 2023-06-29 ENCOUNTER — Ambulatory Visit: Payer: 59 | Admitting: Radiology

## 2023-06-30 MED ORDER — VITAMIN D (ERGOCALCIFEROL) 1.25 MG (50000 UNIT) PO CAPS: 50000.0000 [IU] | ORAL_CAPSULE | ORAL | 0 refills | Status: DC

## 2023-06-30 NOTE — Telephone Encounter (Signed)
 I will continue the vitamin D for 2 more months. If she is not having any results with hydroxyzine 25mg  she can double and take 50mg 

## 2023-06-30 NOTE — Addendum Note (Signed)
 Addended by: Eden Emms on: 06/30/2023 08:12 AM   Modules accepted: Orders

## 2023-07-02 NOTE — Telephone Encounter (Signed)
 [email or web address]

## 2023-07-03 NOTE — Telephone Encounter (Signed)
 Contacted pt and informed her of instructions for hydroxyzine. Pt verbalized understanding and also stated that she will pick up Vitamin D now from pharmacy. No further questions or concerns.

## 2023-07-10 ENCOUNTER — Encounter: Payer: Self-pay | Admitting: Nurse Practitioner

## 2023-07-10 ENCOUNTER — Ambulatory Visit (INDEPENDENT_AMBULATORY_CARE_PROVIDER_SITE_OTHER): Admitting: Nurse Practitioner

## 2023-07-10 VITALS — BP 110/82 | HR 74

## 2023-07-10 DIAGNOSIS — N760 Acute vaginitis: Secondary | ICD-10-CM

## 2023-07-10 DIAGNOSIS — N898 Other specified noninflammatory disorders of vagina: Secondary | ICD-10-CM

## 2023-07-10 DIAGNOSIS — B9689 Other specified bacterial agents as the cause of diseases classified elsewhere: Secondary | ICD-10-CM

## 2023-07-10 DIAGNOSIS — R1030 Lower abdominal pain, unspecified: Secondary | ICD-10-CM | POA: Diagnosis not present

## 2023-07-10 LAB — URINALYSIS, COMPLETE W/RFL CULTURE
Bacteria, UA: NONE SEEN /HPF
Bilirubin Urine: NEGATIVE
Glucose, UA: NEGATIVE
Hgb urine dipstick: NEGATIVE
Hyaline Cast: NONE SEEN /LPF
Ketones, ur: NEGATIVE
Leukocyte Esterase: NEGATIVE
Nitrites, Initial: NEGATIVE
Protein, ur: NEGATIVE
RBC / HPF: NONE SEEN /HPF (ref 0–2)
Specific Gravity, Urine: 1.02 (ref 1.001–1.035)
WBC, UA: NONE SEEN /HPF (ref 0–5)
pH: 5.5 (ref 5.0–8.0)

## 2023-07-10 LAB — NO CULTURE INDICATED

## 2023-07-10 LAB — WET PREP FOR TRICH, YEAST, CLUE

## 2023-07-10 MED ORDER — METRONIDAZOLE 0.75 % VA GEL: 1.0000 | Freq: Every day | VAGINAL | 0 refills | Status: AC

## 2023-07-10 NOTE — Progress Notes (Signed)
   Acute Office Visit  Subjective:    Patient ID: [name], female    DOB: August 18, 1985, 38 y.o.   MRN: [id number]   HPI 38 y.o. presents today for vaginal itching, irritation and odor x 2 days. Has also had some pain in left lower abdomen. Would like urine checked. Denies urinary frequency, urgency, dysuria, hematuria, GI symptoms. Declines STD screening. Negative 12/2022.   No LMP recorded. Patient has had an implant.    Review of Systems  Constitutional: Negative.   Genitourinary:  Positive for pelvic pain, vaginal discharge and vaginal pain. Negative for difficulty urinating, dysuria, flank pain, frequency, genital sores, hematuria and urgency.       + vaginal odor       Objective:    Physical Exam Constitutional:      Appearance: Normal appearance.  Genitourinary:    General: Normal vulva.     Vagina: Vaginal discharge and erythema present.     Cervix: Normal.     BP 110/82   Pulse 74   SpO2 98%  Wt Readings from Last 3 Encounters:  06/28/23 172 lb 2 oz (78.1 kg)  03/21/23 169 lb 6.4 oz (76.8 kg)  01/11/23 169 lb (76.7 kg)        Patient informed chaperone available to be present for breast and/or pelvic exam. Patient has requested no chaperone to be present. Patient has been advised what will be completed during breast and pelvic exam.   Wet prep + clue cells (+ odor)  UA negative  Assessment & Plan:   Problem List Items Addressed This Visit   None Visit Diagnoses       Bacterial vaginosis    -  Primary   Relevant Medications   metroNIDAZOLE (METROGEL) 0.75 % vaginal gel     Vaginal itching       Relevant Orders   WET PREP FOR TRICH, YEAST, CLUE     Lower abdominal pain       Relevant Orders   Urinalysis,Complete w/RFL Culture      Plan: Wet prep positive for clue cells - Metrogel 0.75% nightly x 5 nights. Allergy to oral Metronidazole but reports tolerating vaginal method. UA unremarkable.   Return if symptoms worsen or fail to  improve.    Olivia Mackie DNP, 2:56 PM 07/10/2023

## 2023-07-27 ENCOUNTER — Ambulatory Visit: Admitting: Radiology

## 2023-08-08 ENCOUNTER — Ambulatory Visit: Payer: Self-pay

## 2023-08-08 NOTE — Telephone Encounter (Signed)
 Chief Complaint: brain fogginess Symptoms: brain fogginess and forgetfulness Frequency: years  Pertinent Negatives: Patient denies recent head injury Disposition: [] ED /[] Urgent Care (no appt availability in office) / [x] Appointment(In office/virtual)/ []  [facility]/ [] Home Care/ [] Refused Recommended Disposition /[] [facility]/ []  Follow-up with PCP Additional Notes: pt states that she has been experiencing brain fogginess and forgetfulness for a few years but feels it may be getting worse. States that she did increase her alcohol intake since 09-11-2019 due to a death in her family and since Sep 11, 2019 she had been having the fogginess.   Copied from CRM [id number]. Topic: Clinical - Red Word Triage >> Aug 08, 2023 11:02 AM Clyde Darling P wrote: [facility] that prompted transfer to Nurse Triage: Pt express brain fogginess, has cognitive concerns Reason for Disposition  [1] Longstanding confusion (e.g., dementia, stroke) AND [2] NO worsening or change  Answer Assessment - Initial Assessment Questions 1. LEVEL OF CONSCIOUSNESS: "How is he (she, the patient) acting right now?" (e.g., alert-oriented, confused, lethargic, stuporous, comatose)     Fine but brain fogginess 2. ONSET: "When did the confusion start?"  (minutes, hours, days)     Years ago 3. PATTERN "Does this come and go, or has it been constant since it started?"  "Is it present now?"     Comes and goes 4. ALCOHOL or DRUGS: "Has he been drinking alcohol or taking any drugs?"      alcohol 5. NARCOTIC MEDICINES: "Has he been receiving any narcotic medications?" (e.g., morphine, Vicodin)     no 6. CAUSE: "What do you think is causing the confusion?"      Alcohol and hit her head back in 2019/09/11 7. OTHER SYMPTOMS: "Are there any other symptoms?" (e.g., difficulty breathing, headache, fever, weakness)     no  Protocols used: Confusion - Delirium-A-AH

## 2023-08-08 NOTE — Telephone Encounter (Signed)
 Noted.

## 2023-08-24 ENCOUNTER — Ambulatory Visit: Admitting: Nurse Practitioner

## 2023-08-31 ENCOUNTER — Telehealth: Payer: Self-pay

## 2023-08-31 ENCOUNTER — Ambulatory Visit (INDEPENDENT_AMBULATORY_CARE_PROVIDER_SITE_OTHER): Admitting: Radiology

## 2023-08-31 ENCOUNTER — Encounter: Payer: Self-pay | Admitting: Radiology

## 2023-08-31 VITALS — BP 124/80 | Wt 172.0 lb

## 2023-08-31 DIAGNOSIS — Z30011 Encounter for initial prescription of contraceptive pills: Secondary | ICD-10-CM

## 2023-08-31 DIAGNOSIS — Z3046 Encounter for surveillance of implantable subdermal contraceptive: Secondary | ICD-10-CM

## 2023-08-31 MED ORDER — JUNEL FE 24 1-20 MG-MCG(24) PO TABS: 1.0000 | ORAL_TABLET | Freq: Every day | ORAL | 1 refills | Status: AC

## 2023-08-31 NOTE — Telephone Encounter (Signed)
 Ok to send Junel 24 #84 with 1 refill. AEX due 12/2023 with TW

## 2023-08-31 NOTE — Telephone Encounter (Signed)
 Pt notified and voiced understanding/appreciation. Rx sent and encounter closed.

## 2023-08-31 NOTE — Telephone Encounter (Signed)
 Pt LVM in triage line stating that she doesn't desire to get pregnant right away since implant removal and desires a BCP until ready.   Please advise.

## 2023-08-31 NOTE — Progress Notes (Signed)
 38 y.o. [name] female presents for Nexplanon  removal.  Reason for removal desires pregnancy.  She has decided to use nothing for future contraception.  Procedure, risks and benefits have all been explained.    After all questions were answered, consent was obtained.    Past Medical History:  Diagnosis Date   ASCUS of cervix with negative high risk HPV 05/2016   Chicken pox    Elevated prolactin level    30 range 12/2008, normal X multiple repeats, most recent 18 04/2011   Fibroadenoma of breast    LEFT   IBS (irritable bowel syndrome)    LGSIL (low grade squamous intraepithelial dysplasia) 12/2008, 02/2011   C&B WITH LGSIL 02/2011   MRSA (methicillin resistant staph aureus) culture positive 04/2014   vulvar abscess   STD (sexually transmitted disease)    History of Chlamydia    Past Surgical History:  Procedure Laterality Date   ADENOIDECTOMY     ASA CHILD   AUGMENTATION MAMMAPLASTY     07/2021   BREAST BIOPSY Left 03/2014   CESAREAN SECTION N/A 03/06/2013   Procedure: CESAREAN SECTION;  Surgeon: Romilda Coaster, MD;  Location: [facility];  Service: Obstetrics;  Laterality: N/A;   COLPOSCOPY     DILATION AND CURETTAGE OF UTERUS     INDUCED ABORTION  2007,2008   x2   Nexplanon  insertion  08/08/2016    Current Outpatient Medications on File Prior to Visit  Medication Sig Dispense Refill   etonogestrel  (NEXPLANON ) 68 MG IMPL implant 1 each by Subdermal route once.     hydrOXYzine  (VISTARIL ) 25 MG capsule TAKE 1 CAPSULE(25 MG) BY MOUTH AT BEDTIME AS NEEDED 30 capsule 0   naproxen  (NAPROSYN ) 500 MG tablet Take 1 tablet (500 mg total) by mouth 2 (two) times daily with a meal. 60 tablet 2   Vitamin D , Ergocalciferol , (DRISDOL ) 1.25 MG (50000 UNIT) CAPS capsule Take 1 capsule (50,000 Units total) by mouth every 7 (seven) days. 8 capsule 0   No current facility-administered medications on file prior to visit.   Allergies  Allergen Reactions   Banana Itching   Flagyl   [Metronidazole ] Hives and Itching    Vitals:   08/31/23 1155  BP: 124/80   Physical Exam Vitals and nursing note reviewed. Exam conducted with a chaperone present.  Constitutional:      Appearance: Normal appearance. She is normal weight.  Neurological:     Mental Status: She is alert.  Psychiatric:        Mood and Affect: Mood normal.        Thought Content: Thought content normal.        Judgment: Judgment normal.      Time out performed and informed consent received.  Procedure: Patient placed supine on exam table with her left arm flexed at the elbow. The prior insertion site was located and the Nexplanon  rod was palpated.  Area cleansed with Betadine x 3 and draped in normal sterile fashion.  Insertion site and surrounding tissue anesthetized with 1% Lidocaine  without epinephrine , 2cc total used.  Small incision made with #11 blade.  Nexplanon  removed without difficulty.  Steri-strips were applied and pressure dressing placed over the site.  Entire procedure performed with sterile technique.  Pt tolerated procedure well. Chaperone, Ellis Guys, CMA was present for the entirety of the procedure  Assessment/Plan:   1. Nexplanon  removal (Primary) Begin PNV. Use back up method until ready to conceive.    Post procedure instructions reviewed with pt.  Questions answered.  Pt knows to call with any concerns or questions.  New contraception:  none   [name], [facility]NP

## 2023-09-25 ENCOUNTER — Other Ambulatory Visit: Payer: Self-pay | Admitting: Nurse Practitioner

## 2023-09-25 DIAGNOSIS — G479 Sleep disorder, unspecified: Secondary | ICD-10-CM

## 2023-10-31 ENCOUNTER — Ambulatory Visit: Payer: Self-pay

## 2023-10-31 ENCOUNTER — Emergency Department (HOSPITAL_BASED_OUTPATIENT_CLINIC_OR_DEPARTMENT_OTHER)

## 2023-10-31 ENCOUNTER — Other Ambulatory Visit: Payer: Self-pay

## 2023-10-31 ENCOUNTER — Emergency Department (HOSPITAL_BASED_OUTPATIENT_CLINIC_OR_DEPARTMENT_OTHER)
Admission: EM | Admit: 2023-10-31 | Discharge: 2023-10-31 | Disposition: A | Discharge status: Home / Self Care | Attending: Emergency Medicine | Admitting: Emergency Medicine

## 2023-10-31 ENCOUNTER — Encounter (HOSPITAL_BASED_OUTPATIENT_CLINIC_OR_DEPARTMENT_OTHER): Payer: Self-pay | Admitting: *Deleted

## 2023-10-31 DIAGNOSIS — R1030 Lower abdominal pain, unspecified: Secondary | ICD-10-CM

## 2023-10-31 DIAGNOSIS — Z87891 Personal history of nicotine dependence: Secondary | ICD-10-CM | POA: Insufficient documentation

## 2023-10-31 DIAGNOSIS — K529 Noninfective gastroenteritis and colitis, unspecified: Secondary | ICD-10-CM

## 2023-10-31 DIAGNOSIS — K625 Hemorrhage of anus and rectum: Secondary | ICD-10-CM | POA: Insufficient documentation

## 2023-10-31 LAB — COMPREHENSIVE METABOLIC PANEL WITH GFR
ALT: 12 U/L (ref 0–44)
AST: 18 U/L (ref 15–41)
Albumin: 4.4 g/dL (ref 3.5–5.0)
Alkaline Phosphatase: 45 U/L (ref 38–126)
Anion gap: 13 (ref 5–15)
BUN: 7 mg/dL (ref 6–20)
CO2: 22 mmol/L (ref 22–32)
Calcium: 9.4 mg/dL (ref 8.9–10.3)
Chloride: 105 mmol/L (ref 98–111)
Creatinine, Ser: 0.75 mg/dL (ref 0.44–1.00)
GFR, Estimated: >60 mL/min (ref >60)
Glucose, Bld: 90 mg/dL (ref 70–99)
Potassium: 3.9 mmol/L (ref 3.5–5.1)
Sodium: 139 mmol/L (ref 135–145)
Total Bilirubin: 0.5 mg/dL (ref 0.0–1.2)
Total Protein: 7.3 g/dL (ref 6.5–8.1)

## 2023-10-31 LAB — URINALYSIS, ROUTINE W REFLEX MICROSCOPIC
Bilirubin Urine: NEGATIVE
Glucose, UA: NEGATIVE mg/dL
Hgb urine dipstick: NEGATIVE
Ketones, ur: 40 mg/dL — AB
Leukocytes,Ua: NEGATIVE
Nitrite: NEGATIVE
Protein, ur: NEGATIVE mg/dL
Specific Gravity, Urine: 1.022 (ref 1.005–1.030)
pH: 6 (ref 5.0–8.0)

## 2023-10-31 LAB — LIPASE, BLOOD: Lipase: 20 U/L (ref 11–51)

## 2023-10-31 LAB — CBC
HCT: 42.6 % (ref 36.0–46.0)
Hemoglobin: 13.9 g/dL (ref 12.0–15.0)
MCH: 28 pg (ref 26.0–34.0)
MCHC: 32.6 g/dL (ref 30.0–36.0)
MCV: 85.7 fL (ref 80.0–100.0)
Platelets: 305 K/uL (ref 150–400)
RBC: 4.97 MIL/uL (ref 3.87–5.11)
RDW: 14.3 % (ref 11.5–15.5)
WBC: 8.5 K/uL (ref 4.0–10.5)
nRBC: 0 % (ref 0.0–0.2)

## 2023-10-31 LAB — OCCULT BLOOD X 1 CARD TO LAB, STOOL: Fecal Occult Bld: POSITIVE — AB

## 2023-10-31 LAB — PREGNANCY, URINE: Preg Test, Ur: NEGATIVE

## 2023-10-31 MED ORDER — CIPROFLOXACIN HCL 500 MG PO TABS
500.0000 mg | ORAL_TABLET | Freq: Once | ORAL | Status: AC
  Administered 2023-10-31: 500 mg via ORAL
  Filled 2023-10-31: qty 1

## 2023-10-31 MED ORDER — HYDROMORPHONE HCL 1 MG/ML IJ SOLN
1.0000 mg | Freq: Once | INTRAMUSCULAR | Status: AC
  Administered 2023-10-31: 1 mg via INTRAVENOUS
  Filled 2023-10-31: qty 1

## 2023-10-31 MED ORDER — SODIUM CHLORIDE 0.9 % IV BOLUS
1000.0000 mL | Freq: Once | INTRAVENOUS | Status: AC
  Administered 2023-10-31: 1000 mL via INTRAVENOUS

## 2023-10-31 MED ORDER — ONDANSETRON HCL 4 MG/2ML IJ SOLN
4.0000 mg | Freq: Once | INTRAMUSCULAR | Status: AC
  Administered 2023-10-31: 4 mg via INTRAVENOUS
  Filled 2023-10-31: qty 2

## 2023-10-31 MED ORDER — IOHEXOL 300 MG/ML  SOLN
100.0000 mL | Freq: Once | INTRAMUSCULAR | Status: AC | PRN
  Administered 2023-10-31: 100 mL via INTRAVENOUS

## 2023-10-31 MED ORDER — HYDROCODONE-ACETAMINOPHEN 5-325 MG PO TABS: 1.0000 | ORAL_TABLET | Freq: Four times a day (QID) | ORAL | 0 refills | Status: DC | PRN

## 2023-10-31 MED ORDER — ONDANSETRON 4 MG PO TBDP: 4.0000 mg | ORAL_TABLET | Freq: Three times a day (TID) | ORAL | 0 refills | Status: DC | PRN

## 2023-10-31 MED ORDER — CIPROFLOXACIN HCL 500 MG PO TABS: 500.0000 mg | ORAL_TABLET | Freq: Two times a day (BID) | ORAL | 0 refills | Status: AC

## 2023-10-31 NOTE — Discharge Instructions (Addendum)
 Return for any significant bleeding that stains the toilet water all red x 2.  Otherwise take the antibiotic ciprofloxacin  first dose provided tonight.  Start taking the rest of the tomorrow.  Give gastroenterology a call make an appointment to follow-up with your doctor.  Take the Zofran  ODT as needed for any nausea or vomiting.  And a short course of some pain medicine as needed for pain.  Work note provided.

## 2023-10-31 NOTE — Telephone Encounter (Signed)
 Agree with disposition.

## 2023-10-31 NOTE — ED Triage Notes (Signed)
 Patient to ED reporting NV and shortness of breath starting today at 4am. Patient reporting it feels like she needs to use the bathroom but has only been having bright red colored blood as bowel movements.

## 2023-10-31 NOTE — ED Provider Notes (Signed)
 [facility] EMERGENCY DEPARTMENT AT [facility] Provider Note   CSN: [id number] Arrival date & time: 10/31/23  [id number]     Patient presents with: Abdominal Pain and Rectal Bleeding   [name] is a 38 y.o. female.  {Add pertinent medical, surgical, social history, OB history to HPI:32947} HPI     Prior to Admission medications   Medication Sig Start Date End Date Taking? Authorizing Provider  ciprofloxacin  (CIPRO ) 500 MG tablet Take 1 tablet (500 mg total) by mouth every 12 (twelve) hours for 7 days. 10/31/23 11/07/23 Yes Perez Dirico, MD  HYDROcodone -acetaminophen  (NORCO/VICODIN) 5-325 MG tablet Take 1 tablet by mouth every 6 (six) hours as needed for moderate pain (pain score 4-6). 10/31/23  Yes Telesforo Brosnahan, MD  ondansetron  (ZOFRAN -ODT) 4 MG disintegrating tablet Take 1 tablet (4 mg total) by mouth every 8 (eight) hours as needed for nausea or vomiting. 10/31/23  Yes Welda Azzarello, MD  hydrOXYzine  (VISTARIL ) 25 MG capsule TAKE 1 CAPSULE(25 MG) BY MOUTH AT BEDTIME AS NEEDED 09/26/23   Wendee Lynwood HERO, NP  naproxen  (NAPROSYN ) 500 MG tablet Take 1 tablet (500 mg total) by mouth 2 (two) times daily with a meal. 06/28/23   Copland, Jacques, MD  Norethindrone  Acetate-Ethinyl Estrad-FE (JUNEL  FE 24) 1-20 MG-MCG(24) tablet Take 1 tablet by mouth daily. 08/31/23   Chrzanowski, Jami B, NP  Vitamin D , Ergocalciferol , (DRISDOL ) 1.25 MG (50000 UNIT) CAPS capsule Take 1 capsule (50,000 Units total) by mouth every 7 (seven) days. 06/30/23   Wendee Lynwood HERO, NP    Allergies: Banana and Flagyl  [metronidazole ]    Review of Systems  Updated Vital Signs BP 119/72 (BP Location: Left Arm)   Pulse 68   Temp 98.5 F (36.9 C)   Resp 18   LMP 10/29/2023   SpO2 97%   Physical Exam  (all labs ordered are listed, but only abnormal results are displayed) Labs Reviewed  URINALYSIS, ROUTINE W REFLEX MICROSCOPIC - Abnormal; Notable for the following components:      Result Value   Ketones,  ur 40 (*)    All other components within normal limits  OCCULT BLOOD X 1 CARD TO LAB, STOOL - Abnormal; Notable for the following components:   Fecal Occult Bld POSITIVE (*)    All other components within normal limits  LIPASE, BLOOD  COMPREHENSIVE METABOLIC PANEL WITH GFR  CBC  PREGNANCY, URINE    EKG: None  Radiology: CT ABDOMEN PELVIS W CONTRAST Result Date: 10/31/2023 CLINICAL DATA:  Acute nonlocalized abdominal pain. Nausea, vomiting, and shortness of breath today. EXAM: CT ABDOMEN AND PELVIS WITH CONTRAST TECHNIQUE: Multidetector CT imaging of the abdomen and pelvis was performed using the standard protocol following bolus administration of intravenous contrast. RADIATION DOSE REDUCTION: This exam was performed according to the departmental dose-optimization program which includes automated exposure control, adjustment of the mA and/or kV according to patient size and/or use of iterative reconstruction technique. CONTRAST:  OMNIPAQUE  IOHEXOL  300 MG/ML  SOLN COMPARISON:  None Available. FINDINGS: Lower chest: Lung bases are clear. Bilateral breast implants are partially visualized. Hepatobiliary: No focal liver abnormality is seen. No gallstones, gallbladder wall thickening, or biliary dilatation. Pancreas: Unremarkable. No pancreatic ductal dilatation or surrounding inflammatory changes. Spleen: Normal in size without focal abnormality. Adrenals/Urinary Tract: Adrenal glands are unremarkable. Kidneys are normal, without renal calculi, focal lesion, or hydronephrosis. Bladder is decompressed. Stomach/Bowel: Stomach, small bowel, and colon are mostly decompressed. Decompression of the colon limits evaluation but there is suggestion of colonic wall  thickening possibly indicating colitis. This could represent infectious or inflammatory colitis. Appendix is normal. Vascular/Lymphatic: No significant vascular findings are present. No enlarged abdominal or pelvic lymph nodes. Reproductive: Uterus  and bilateral adnexa are unremarkable. Other: No free air or free fluid in the abdomen. Abdominal wall musculature appears intact. Musculoskeletal: No acute or significant osseous findings. IMPRESSION: 1. No evidence of bowel obstruction or distention. 2. Under distention of the colon limits evaluation but there is suggestion of diffuse colonic wall thickening which may indicate infectious or inflammatory colitis. 3. No other acute process suggested. Electronically Signed   By: Elsie Gravely M.D.   On: 10/31/2023 20:35    {Document cardiac monitor, telemetry assessment procedure when appropriate:32947} Procedures   Medications Ordered in the ED  iohexol  (OMNIPAQUE ) 300 MG/ML solution 100 mL (100 mLs Intravenous Contrast Given 10/31/23 2018)  ondansetron  (ZOFRAN ) injection 4 mg (4 mg Intravenous Given 10/31/23 2040)  HYDROmorphone  (DILAUDID ) injection 1 mg (1 mg Intravenous Given 10/31/23 2040)  sodium chloride  0.9 % bolus 1,000 mL (1,000 mLs Intravenous New Bag/Given 10/31/23 2041)  ciprofloxacin  (CIPRO ) tablet 500 mg (500 mg Oral Given 10/31/23 2105)      {Click here for ABCD2, HEART and other calculators REFRESH Note before signing:1}                              Medical Decision Making Amount and/or Complexity of Data Reviewed Labs: ordered. Radiology: ordered.  Risk Prescription drug management.   CT scan showing evidence of diffuse colitis.  That would fit clinically with the mucousy blood staining.  No other acute findings.  Patient's white blood cell count was normal.  I think we will give a trial of some antibiotics.  Cipro  probably best choice.  Will give her referral to GI medicine as well.  And follow-up with her primary care doctor.  Final diagnoses:  Lower abdominal pain  Rectal bleeding  Colitis    ED Discharge Orders          Ordered    ciprofloxacin  (CIPRO ) 500 MG tablet  Every 12 hours        10/31/23 2105    ondansetron  (ZOFRAN -ODT) 4 MG disintegrating tablet   Every 8 hours PRN        10/31/23 2105    HYDROcodone -acetaminophen  (NORCO/VICODIN) 5-325 MG tablet  Every 6 hours PRN        10/31/23 2105

## 2023-10-31 NOTE — Telephone Encounter (Signed)
 FYI Only or Action Required?: FYI only for provider.  Patient was last seen in primary care on 06/28/2023 by [name], MD.  Called Nurse Triage reporting Fatigue.  Symptoms began today.  Interventions attempted: Nothing.  Symptoms are: rapidly worsening.  Triage Disposition: No disposition on file.   1. DESCRIPTION: Describe how you are feeling. Mother on line answering questions, weak, nauseous, pain in stomach 2. SEVERITY: How bad is it? Can you stand and walk? - MILD (0-3): Feels weak or tired, but does not interfere with work, school or normal activities. - MODERATE (4-7): Able to stand and walk; weakness interferes with work, school, or normal activities. - SEVERE (8-10): Unable to stand or walk; unable to do usual activities. Severe 3. ONSET: When did these symptoms begin? (e.g., hours, days, weeks, months) 4 am today 4. CAUSE: What do you think is causing the weakness or fatigue? (e.g., not drinking enough fluids, medical problem, trouble sleeping) unknown 5. NEW MEDICINES: Have you started on any new medicines recently? (e.g., opioid pain medicines, benzodiazepines, muscle relaxants, antidepressants, antihistamines, neuroleptics, beta blockers) no 6. OTHER SYMPTOMS: Do you have any other symptoms? (e.g., chest pain, fever, cough, SOB, vomiting, diarrhea, bleeding, other areas of pain) Abd pain, sob, diarrhea, bleeding from rectum. 7. PREGNANCY: Is there any chance you are pregnant? When was your last menstrual period? na  This RN called 911 for patient  Copied from CRM [id number]. Topic: Clinical - Red Word Triage >> Oct 31, 2023 11:27 AM Shereese L wrote: [facility] that prompted transfer to Nurse Triage: Patient mother is on the in a lot of pain, weak, nausea, stomach pain and can barely speak

## 2023-10-31 NOTE — Telephone Encounter (Signed)
 FYI Only or Action Required?: FYI only for provider.  Patient was last seen in primary care on 06/28/2023 by [name], MD.  Called Nurse Triage reporting Advice Only.  Symptoms began N/A.  Interventions attempted: Other: N/A.  Symptoms are: unchanged.  Triage Disposition: Information or Advice Only Call  Patient/caregiver understands and will follow disposition?: Yes                            Copied from CRM [id number]. Topic: Clinical - Red Word Triage >> Oct 31, 2023  4:22 PM Gennette ORN wrote: Red Word that prompted transfer to Nurse Triage: Patient mom Dickey called in about the patient having an IV and they gave her something for nausea. Her blood pressure was very low. She is able to speak , she has shortness of breath, she is also bleeding from her rectum as well.  Reason for Disposition  Health information question, no triage required and triager able to answer question  Answer Assessment - Initial Assessment Questions 1. REASON FOR CALL: What is the main reason for your call? or How can I best help you?     Patient was triaged this morning by another RN. Patient called in reporting the same symptoms. Patient called 911 this morning and paramedics advised patient to call 911 again, if symptoms return. This RN advised patient to call 911. Patient verbalized understanding and agreed to call.  Protocols used: Information Only Call - No Triage-A-AH

## 2023-10-31 NOTE — ED Provider Notes (Signed)
 [place] EMERGENCY DEPARTMENT AT [facility] Provider Note   CSN: [id number] Arrival date & time: 10/31/23  8175     Patient presents with: Abdominal Pain and Rectal Bleeding   [name] is a 37 y.o. female.   Patient with acute onset of periumbilical and lower abdominal pain at 430 this morning.  With the urgency to feel like she needed to have a bowel movement.  Patient has had some few small bowel movements with some red blood.  Mostly with wiping not really standing the toilet water red.  No vaginal discharge.  However menstrual period was very short this go around.  Associated with some nausea and vomiting but no real diarrhea.  No fevers.  Never had anything like this before.  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  Past surgical history significant for induced abortion and D&C cesarean section.  Patient is a former smoker quit in 2011.       Prior to Admission medications   Medication Sig Start Date End Date Taking? Authorizing Provider  hydrOXYzine  (VISTARIL ) 25 MG capsule TAKE 1 CAPSULE(25 MG) BY MOUTH AT BEDTIME AS NEEDED 09/26/23   Wendee Lynwood HERO, NP  naproxen  (NAPROSYN ) 500 MG tablet Take 1 tablet (500 mg total) by mouth 2 (two) times daily with a meal. 06/28/23   Copland, Jacques, MD  Norethindrone  Acetate-Ethinyl Estrad-FE (JUNEL  FE 24) 1-20 MG-MCG(24) tablet Take 1 tablet by mouth daily. 08/31/23   Chrzanowski, Jami B, NP  Vitamin D , Ergocalciferol , (DRISDOL ) 1.25 MG (50000 UNIT) CAPS capsule Take 1 capsule (50,000 Units total) by mouth every 7 (seven) days. 06/30/23   Wendee Lynwood HERO, NP    Allergies: Banana and Flagyl  [metronidazole ]    Review of Systems  Constitutional:  Negative for chills and fever.  HENT:  Negative for ear pain and sore throat.   Eyes:  Negative for pain and visual disturbance.  Respiratory:  Negative for cough and  shortness of breath.   Cardiovascular:  Negative for chest pain and palpitations.  Gastrointestinal:  Positive for abdominal pain, blood in stool, nausea and vomiting.  Genitourinary:  Negative for dysuria and hematuria.  Musculoskeletal:  Negative for arthralgias and back pain.  Skin:  Negative for color change and rash.  Neurological:  Negative for seizures and syncope.  All other systems reviewed and are negative.   Updated Vital Signs BP 119/72 (BP Location: Left Arm)   Pulse 68   Temp 98.5 F (36.9 C)   Resp 18   LMP 10/29/2023   SpO2 97%   Physical Exam Vitals and nursing note reviewed.  Constitutional:      General: She is not in acute distress.    Appearance: Normal appearance. She is well-developed.  HENT:     Head: Normocephalic and atraumatic.     Mouth/Throat:     Mouth: Mucous membranes are moist.  Eyes:     Extraocular Movements: Extraocular movements intact.     Conjunctiva/sclera: Conjunctivae normal.     Pupils: Pupils are equal, round, and reactive to light.  Cardiovascular:     Rate and Rhythm: Normal rate and regular rhythm.     Heart sounds: No murmur heard. Pulmonary:     Effort: Pulmonary effort is normal. No respiratory distress.     Breath sounds: Normal breath sounds.  Abdominal:     Palpations: Abdomen is soft.     Tenderness: There is abdominal tenderness. There is no  guarding.     Comments: Mild tenderness lower part of the abdomen.  Genitourinary:    Rectum: Normal. Guaiac result positive.     Comments: Rectal exam perianal no external hemorrhoid little bit of a skin tag no fissure.  No stool in the anal vault.  Nontender.  Sort of mucousy bloody type in rectum. Musculoskeletal:        General: No swelling.     Cervical back: Normal range of motion and neck supple.  Skin:    General: Skin is warm and dry.     Capillary Refill: Capillary refill takes less than 2 seconds.  Neurological:     General: No focal deficit present.     Mental  Status: She is alert and oriented to person, place, and time.  Psychiatric:        Mood and Affect: Mood normal.     (all labs ordered are listed, but only abnormal results are displayed) Labs Reviewed  URINALYSIS, ROUTINE W REFLEX MICROSCOPIC - Abnormal; Notable for the following components:      Result Value   Ketones, ur 40 (*)    All other components within normal limits  LIPASE, BLOOD  COMPREHENSIVE METABOLIC PANEL WITH GFR  CBC  PREGNANCY, URINE    EKG: None  Radiology: No results found.   Procedures   Medications Ordered in the ED  iohexol  (OMNIPAQUE ) 300 MG/ML solution 100 mL (100 mLs Intravenous Contrast Given 10/31/23 2018)                                    Medical Decision Making Amount and/or Complexity of Data Reviewed Labs: ordered. Radiology: ordered.  Risk Prescription drug management.   Patient is abdominal pain and some of the mucousy blood on rectal exam require CT scan.  Complete metabolic panel normal lipase normal.  Anion gap normal urinalysis negative.  Pregnancy test negative.  CBC no leukocytosis hemoglobin 13.9 platelets 305.  I will get CT scan abdomen and pelvis.  Will give some pain medicine will give a little bit of fluid.  The amount of rectal bleeding which is not staining the toilet water already does not necessarily require admission.  Final diagnoses:  Lower abdominal pain  Rectal bleeding    ED Discharge Orders     None          Geraldene Hamilton, MD 10/31/23 2022

## 2023-11-01 ENCOUNTER — Ambulatory Visit: Payer: Self-pay

## 2023-11-01 NOTE — Telephone Encounter (Signed)
 Noted

## 2023-11-01 NOTE — Telephone Encounter (Signed)
 FYI Only or Action Required?:   Patient was last seen in primary care on 06/28/2023 by [name], MD.  Called Nurse Triage reporting Abdominal Pain.  Symptoms began several days ago.  Interventions attempted: Nothing.  Symptoms are: unchanged.  Triage Disposition: Home Care Advised patient to Start taking your medications and follow-up with GI.Referral was sent to [facility]. HFU appt scheduled with your PCP on 07/16.  Also advised to Closely monitor for worsening rectal bleeding and pain unrelieved with the medications you have been prescribed as noted in ED noted. Also, closely monitor for dizziness, lightheadedness and shortness of breath. Patient verbalzied understanding  Patient/caregiver understands and will follow disposition?: Yes       Copied from CRM [id number]. Topic: Clinical - Red Word Triage >> Nov 01, 2023 10:49 AM [place] J wrote: Patient is still having severe stomach pains. She was recently released from the ER for the symptoms from yesterday and was instructed to follow-up with her PCP and get a referral to a GI doctor due to being diagnosed with colitis. Reason for Disposition  [1] Recent medical visit within 24 hours AND [2] condition / symptoms SAME (unchanged) AND [3] caller has additional questions triager can answer  Answer Assessment - Initial Assessment Questions 1. MAIN CONCERN OR SYMPTOM:  What is your main concern right now? What question do you have? What's the main symptom you're worried about? (e.g., breathing difficulty, cough, fever, pain)     Abdominal Pain and rectal bleeding  2. ONSET: When did the  Abdominal Pain and rectal bleeding  start?     07/09 around 0430 in the morning (per ED note)  3. BETTER-SAME-WORSE: Are you getting better, staying the same, or getting worse compared to how you felt at your last visit to the doctor (most recent medical visit)?     Same  4. VISIT DATE: When were you seen? (e.g., date)      07/09  5. VISIT DOCTOR: What is the name of the doctor taking care of you now?     ED physician - Dr. Zackowski  6. VISIT DIAGNOSIS:  What was the main symptom or problem that you were seen for? Were you given a diagnosis?      Lower abd pain and rectal bleeding  7. VISIT MEDICINES: Did the doctor order any new medicines for you to use? If Yes, ask: Have you filled the prescription and started taking the medicine?      Cipro , Norco, and Zofran - has not started taking medications yet  8. NEXT APPOINTMENT: Have you scheduled a follow-up appointment with your doctor?     Scheduled for 11/07/23 (scheduled during NT call)  9. PAIN: Is there any pain? If Yes, ask: How bad is it?  (Scale 0-10; or none, mild, moderate, severe)     Moderate pain  10. FEVER: Do you have a fever? If Yes, ask: What is it, how was it measured  and when did it start?       No  11. OTHER SYMPTOMS: Do you have any other symptoms?       No  12. PREGNANCY: Is there any chance you are pregnant? When was your last menstrual period?       No  Protocols used: Recent Medical Visit for Illness Follow-up Call-A-AH

## 2023-11-02 ENCOUNTER — Ambulatory Visit: Payer: Self-pay

## 2023-11-02 NOTE — Telephone Encounter (Signed)
 Noted

## 2023-11-02 NOTE — Telephone Encounter (Signed)
 FYI Only or Action Required?: FYI only for provider.  Patient was last seen in primary care on 06/28/2023 by [name], MD.  Called Nurse Triage reporting urination issue.  Symptoms began today.  Interventions attempted: Nothing.  Symptoms are: unchanged.  Triage Disposition: No disposition on file.  Patient/caregiver understands and will follow disposition?:  Called for advice on constipation.  Given resources and has f/u apt scheduled for next week.   Copied from CRM [id number]. Topic: Clinical - Red Word Triage >> Nov 02, 2023 12:33 PM [name] wrote: Red Word that prompted transfer to Nurse Triage: not able to use the bathroom at all called day before yesterday nurse send 911 to her daughters house daughter is having symptoms and she is having trouble using the restroom

## 2023-11-07 ENCOUNTER — Inpatient Hospital Stay: Admitting: Nurse Practitioner

## 2023-11-07 NOTE — Progress Notes (Deleted)
   Acute Office Visit  Subjective:     Patient ID: [name], female    DOB: April 28, 1985, 38 y.o.   MRN: [id number]  No chief complaint on file.   HPI Patient is in today for hospital follow-up  Patient was seen in the emergency department on 10/31/2023 with acute onset of periumbilical abdominal pain.  Patient was also having some fecal urgency.  She had a few small bowel movements with the red blood.  This is mostly noticed with wiping.  She also had some nausea and vomiting but no fevers.  We did perform a CT scan of the abdomen and pelvis.  CT scan did show diffuse colitis and was given a course of antibiotics, ciprofloxacin .  Patient was given referral to GI.  ROS      Objective:    LMP 10/29/2023  {Vitals History (Optional):23777}  Physical Exam  No results found for any visits on 11/07/23.      Assessment & Plan:   Problem List Items Addressed This Visit   None   No orders of the defined types were placed in this encounter.   No follow-ups on file.  Adina Crandall, NP

## 2023-12-02 ENCOUNTER — Other Ambulatory Visit: Payer: Self-pay | Admitting: Nurse Practitioner

## 2023-12-02 DIAGNOSIS — G479 Sleep disorder, unspecified: Secondary | ICD-10-CM

## 2024-01-14 ENCOUNTER — Ambulatory Visit: Payer: Self-pay

## 2024-01-14 ENCOUNTER — Ambulatory Visit (INDEPENDENT_AMBULATORY_CARE_PROVIDER_SITE_OTHER): Admitting: Primary Care

## 2024-01-14 ENCOUNTER — Encounter: Payer: Self-pay | Admitting: Primary Care

## 2024-01-14 VITALS — BP 122/76 | HR 72 | Temp 97.5°F | Ht 64.0 in | Wt 159.0 lb

## 2024-01-14 DIAGNOSIS — F4320 Adjustment disorder, unspecified: Secondary | ICD-10-CM | POA: Diagnosis not present

## 2024-01-14 DIAGNOSIS — G479 Sleep disorder, unspecified: Secondary | ICD-10-CM

## 2024-01-14 MED ORDER — TRAZODONE HCL 50 MG PO TABS: 50.0000 mg | ORAL_TABLET | Freq: Every evening | ORAL | 0 refills | Status: DC | PRN

## 2024-01-14 NOTE — Telephone Encounter (Signed)
 Patient evaluated.

## 2024-01-14 NOTE — Assessment & Plan Note (Addendum)
 Actively going through the grieving process.   Offered referral to therapy, she agrees.  Start taking Trazadone 50 mg at night to help with sleep as high dose hydroxyzine  is ineffective.   Follow up with PCP if no improvements.  I evaluated patient, was consulted regarding treatment, and agree with assessment and plan per Miche Loughridge, MSN, FNP student.   Mallie Gaskins, NP-C

## 2024-01-14 NOTE — Assessment & Plan Note (Addendum)
 Her sleep disturbance is suspected to be secondary to recent grief.   We discussed options. Since max dose of hydroxyzine  at 50 mg is ineffective, will start Trazadone 50 mg at night to help with sleep disturbance.   Consider increasing dose if no improvement with sleep. Referral placed to therapy.   Follow up with PCP.  I evaluated patient, was consulted regarding treatment, and agree with assessment and plan per Lakiesha Ralphs, MSN, FNP student.   Mallie Gaskins, NP-C

## 2024-01-14 NOTE — Telephone Encounter (Signed)
 FYI Only or Action Required?: FYI only for provider.  Patient was last seen in primary care on 06/28/2023 by [name], MD.  Called Nurse Triage reporting Anxiety and Depression. - Pt's boyfriend dies unexpectedly.  Symptoms began a week ago.  Interventions attempted: Other: Self medicate.  Symptoms are: gradually worsening.  Triage Disposition: See Physician Within 24 Hours  Patient/caregiver understands and will follow disposition?: Yes              Copied from CRM #[id number]. Topic: Clinical - Red Word Triage >> Jan 14, 2024  9:25 AM Antwanette L wrote: Red Word that prompted transfer to Nurse Triage: The patient is currently experiencing symptoms of anxiety and depression. Reason for Disposition  [1] Depression AND [2] getting worse (e.g., sleeping poorly, less able to do activities of daily living)  Answer Assessment - Initial Assessment Questions Pt called with depression - boyfriend died last 2024-02-01 suddenly and cause is not clear. Pt is struggling with sleep. She is drinking to sleep. She states she has also taken a sleep aid to help with sleep.  Gave pt phone number for [facility] and suicide hotline.     1. CONCERN: What happened that made you call today?     Boyfriend died last February 01, 2024 2. DEPRESSION SYMPTOM SCREENING: How are you feeling overall? (e.g., decreased energy, increased sleeping or difficulty sleeping, difficulty concentrating, feelings of sadness, guilt, hopelessness, or worthlessness)     Very sad 3. RISK OF HARM - SUICIDAL IDEATION:  Do you ever have thoughts of hurting or killing yourself?  (e.g., yes, no, no but preoccupation with thoughts about death)     Not anymore 4. RISK OF HARM - HOMICIDAL IDEATION:  Do you ever have thoughts of hurting or killing someone else?  (e.g., yes, no, no but preoccupation with thoughts about death)     no 5. FUNCTIONAL IMPAIRMENT: How have things been going for you overall? Have you had more difficulty  than usual doing your normal daily activities?  (e.g., better, same, worse; self-care, school, work, interactions)     Can't sleep - Sleep aid or alcohol 6. SUPPORT: Who is with you now? Who do you live with? Do you have family or friends who you can talk to?      2 kids, boyfriends 2 best friends 7. THERAPIST: Do you have a counselor or therapist? If Yes, ask: What is their name?     no 8. STRESSORS: Has there been any new stress or recent changes in your life?     Death of boyfriend 43. ALCOHOL USE OR SUBSTANCE USE (DRUG USE): Do you drink alcohol or use any illegal drugs?     Yes - alcohol to sleep 10. OTHER: Do you have any other physical symptoms right now? (e.g., fever)       Nausea 11. PREGNANCY: Is there any chance you are pregnant? When was your last menstrual period?       no  Protocols used: Depression-A-AH

## 2024-01-14 NOTE — Telephone Encounter (Signed)
 noted

## 2024-01-14 NOTE — Telephone Encounter (Signed)
 FYI patient is scheduled to see Mallie Gaskins, NP today at 12 pm.

## 2024-01-14 NOTE — Progress Notes (Signed)
 //  Subjective:    Patient ID: [name], female    DOB: 1985/12/31, 38 y.o.   MRN: [id number]  [name] is a very pleasant 38 y.o. female patient of Matt, NP with a history of sleep disturbance who presents today to discuss depression.   Symptom onset 01/11/24 after the sudden and unexpected loss of her boyfriend. Symptoms include feeling anxious, sleep disturbance, worrying, little motivation and interest in doing things, feeling down/sad, thoughts of self harm. She has no plan for self harm.   No prior history of anxiety or depression. She does have a supportive family. She's mostly requesting help with sleep.  She is managed on hydroxyzine  25 mg as needed which has not been effective.  She has doubled her dose to 50 mg which has helped some but not entirely.  She has also tried numerous over-the-counter sleep aids without improvement.     01/14/2024   12:12 PM 11/15/2022   12:01 PM 06/07/2022    9:30 AM 06/29/2021   12:09 PM  GAD 7 : Generalized Anxiety Score  Nervous, Anxious, on Edge 3 1 1 1   Control/stop worrying 3 1 0 1  Worry too much - different things 2 1 1 1   Trouble relaxing 2 1 1 1   Restless 2 0 1 0  Easily annoyed or irritable 2 0 1 1  Afraid - awful might happen 1  0 1  Total GAD 7 Score 15  5 6   Anxiety Difficulty Extremely difficult Not difficult at all Not difficult at all Not difficult at all    [facility] Visit from 01/14/2024 in [facility]  PHQ-9 Total Score 17    Wt Readings from Last 3 Encounters:  01/14/24 159 lb (72.1 kg)  08/31/23 172 lb (78 kg)  06/28/23 172 lb 2 oz (78.1 kg)     Review of Systems  Respiratory:  Negative for shortness of breath.   Cardiovascular:  Negative for chest pain.  Psychiatric/Behavioral:  The patient is nervous/anxious.        See HPI         Past Medical History:  Diagnosis Date   ASCUS of cervix with negative high risk HPV 05/2016   Chicken pox     Elevated prolactin level    30 range 12/2008, normal X multiple repeats, most recent 18 04/2011   Fibroadenoma of breast    LEFT   IBS (irritable bowel syndrome)    LGSIL (low grade squamous intraepithelial dysplasia) 12/2008, 02/2011   C&B WITH LGSIL 02/2011   MRSA (methicillin resistant staph aureus) culture positive 04/2014   vulvar abscess   STD (sexually transmitted disease)    History of Chlamydia    Social History   Socioeconomic History   Marital status: Single    Spouse name: Not on file   Number of children: 2   Years of education: Not on file   Highest education level: Not on file  Occupational History   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 on some weekends 2 times a month   Drug use: Not Currently   Sexual activity: Yes    Partners: Male    Birth control/protection: Implant    Comment: nexplanon  inserted 02-01-21, menarche 38yo, sexual debut 38yo  Other Topics Concern   Not on file  Social History Narrative   Fulltime: [employer]   Social Drivers of Health   Financial Resource Strain: Not on file  Food Insecurity: Not on file  Transportation Needs: Not on file  Physical Activity: Not on file  Stress: Not on file  Social Connections: Not on file  Intimate Partner Violence: Not on file    Past Surgical History:  Procedure Laterality Date   ADENOIDECTOMY     ASA CHILD   AUGMENTATION MAMMAPLASTY     07/2021   BREAST BIOPSY Left 03/2014   CESAREAN SECTION N/A 03/06/2013   Procedure: CESAREAN SECTION;  Surgeon: Debby JULIANNA Lares, MD;   Location: [facility];  Service: Obstetrics;  Laterality: N/A;   COLPOSCOPY     DILATION AND CURETTAGE OF UTERUS     INDUCED ABORTION  2007,2008   x2   Nexplanon  insertion  08/08/2016    Family History  Problem Relation Age of Onset   Aneurysm Mother    Other Brother        pre diabetic   Aneurysm Maternal Grandfather    Arthritis Neg Hx    Birth defects Neg Hx    COPD Neg Hx    Depression Neg Hx    Drug abuse Neg Hx    Early death Neg Hx    Hearing loss Neg Hx    Heart disease Neg Hx    Kidney disease Neg Hx    Learning disabilities Neg Hx    Mental illness Neg Hx    Mental retardation Neg Hx    Miscarriages / Stillbirths Neg Hx    Vision loss Neg Hx    Varicose Veins Neg Hx     Allergies  Allergen Reactions   Banana Itching   Flagyl  [Metronidazole ] Hives and Itching    Current Outpatient Medications on File Prior to Visit  Medication Sig Dispense Refill   naproxen  (NAPROSYN ) 500 MG tablet Take 1 tablet (500 mg total) by mouth 2 (two) times daily with a meal. 60 tablet 2   Norethindrone  Acetate-Ethinyl Estrad-FE (JUNEL  FE 24) 1-20 MG-MCG(24) tablet Take 1 tablet by mouth daily. (Patient not taking: Reported on 01/14/2024) 84 tablet 1   ondansetron  (ZOFRAN -ODT) 4 MG disintegrating tablet Take 1 tablet (4 mg total) by mouth every 8 (eight) hours as needed for nausea or vomiting. (Patient not taking: Reported on 01/14/2024) 12 tablet 0   Vitamin D , Ergocalciferol , (DRISDOL ) 1.25 MG (50000 UNIT) CAPS capsule Take 1 capsule (50,000 Units total) by mouth every 7 (seven) days. (Patient not taking: Reported on 01/14/2024) 8 capsule 0   No current facility-administered medications on file prior to visit.    BP 122/76   Pulse 72   Temp (!) 97.5 F (36.4 C) (Temporal)   Ht 5' 4 (1.626 m)   Wt 159 lb (72.1 kg)   LMP 01/07/2024 (Exact Date)   SpO2 100%   BMI 27.29 kg/m  Objective:   Physical Exam Cardiovascular:     Rate and Rhythm: Normal rate.  Pulmonary:     Effort:  Pulmonary effort is normal.  Musculoskeletal:     Cervical back: Neck  supple.  Skin:    General: Skin is warm and dry.  Neurological:     Mental Status: She is alert and oriented to person, place, and time.  Psychiatric:        Mood and Affect: Mood normal.     Physical Exam        Assessment & Plan:  Grief reaction Assessment & Plan: Actively going through the grieving process.   Offered referral to therapy, she agrees.  Start taking Trazadone 50 mg at night to help with sleep as high dose hydroxyzine  is ineffective.   Follow up with PCP if no improvements.  I evaluated patient, was consulted regarding treatment, and agree with assessment and plan per Kristin Rudd, MSN, FNP student.   Mallie Gaskins, NP-C   Orders: -     Ambulatory referral to Psychology  Sleep disturbance Assessment & Plan: Her sleep disturbance is suspected to be secondary to recent grief.   We discussed options. Since max dose of hydroxyzine  at 50 mg is ineffective, will start Trazadone 50 mg at night to help with sleep disturbance.   Consider increasing dose if no improvement with sleep. Referral placed to therapy.   Follow up with PCP.  I evaluated patient, was consulted regarding treatment, and agree with assessment and plan per Kristin Rudd, MSN, FNP student.   Mallie Gaskins, NP-C    Orders: -     traZODone  HCl; Take 1 tablet (50 mg total) by mouth at bedtime as needed for sleep.  Dispense: 30 tablet; Refill: 0    Assessment and Plan Assessment & Plan         Cassandra MARLA Gaskins, NP     History of Present Illness

## 2024-01-14 NOTE — Progress Notes (Signed)
   Acute Office Visit  Subjective:     Patient ID: [name], female    DOB: 1985/08/11, 38 y.o.   MRN: [id number]  Chief Complaint  Patient presents with   Depression    Depression        Associated symptoms include insomnia and suicidal ideas.  [name] is a 38 year old female who presents today with concerns of depression. Recent loss of boyfriend in the home on 01/06/2024. She feels nervous and anxious with trouble sleeping. She has supportive family and friends that she says she can talk to.       01/14/2024   12:12 PM 11/15/2022   12:01 PM 06/07/2022    9:30 AM 06/29/2021   12:09 PM  GAD 7 : Generalized Anxiety Score  Nervous, Anxious, on Edge 3 1 1 1   Control/stop worrying 3 1 0 1  Worry too much - different things 2 1 1 1   Trouble relaxing 2 1 1 1   Restless 2 0 1 0  Easily annoyed or irritable 2 0 1 1  Afraid - awful might happen 1  0 1  Total GAD 7 Score 15  5 6   Anxiety Difficulty Extremely difficult Not difficult at all Not difficult at all Not difficult at all   [facility] Visit from 01/14/2024 in [facility] [place]  PHQ-9 Total Score 17       Review of Systems  Respiratory: Negative.    Cardiovascular:  Positive for chest pain.  Gastrointestinal:  Positive for nausea.  Neurological: Negative.   Psychiatric/Behavioral:  Positive for depression and suicidal ideas. The patient is nervous/anxious and has insomnia.         Objective:    BP 122/76   Pulse 72   Temp (!) 97.5 F (36.4 C) (Temporal)   Ht 5' 4 (1.626 m)   Wt 72.1 kg   LMP 01/07/2024 (Exact Date)   SpO2 100%   BMI 27.29 kg/m    Physical Exam Cardiovascular:     Rate and Rhythm: Normal rate and regular rhythm.  Pulmonary:     Effort: Pulmonary effort is normal.     Breath sounds: Normal breath sounds.  Neurological:     Mental Status: She is alert.     No results found for any visits on 01/14/24.      Assessment & Plan:    Problem List Items Addressed This Visit   None   No orders of the defined types were placed in this encounter.   No follow-ups on file.  Tifanny Dollens, RN

## 2024-01-14 NOTE — Patient Instructions (Signed)
 Check your MyChart portal as discussed.  Start taking Trazadone 50 mg at night as needed for sleep.  Up date if no improvement.

## 2024-01-15 ENCOUNTER — Ambulatory Visit: Admitting: Radiology

## 2024-01-15 NOTE — Progress Notes (Deleted)
 [name] 01-01-1986 [id number]   History:  38 y.o. G5P2 presents for annual exam. No gyn concerns. Hx of CIN-1, last pap ASCUS.   Gynecologic History Patient's last menstrual period was 01/07/2024 (exact date).   Contraception/Family planning: Nexplanon  Sexually active: yes Last Pap: 9/24. Results were: abnormal ASCUS HPV neg   Obstetric History OB History  Gravida Para Term Preterm AB Living  5 2 2  3 2   SAB IAB Ectopic Multiple Live Births  1 2   2     # Outcome Date GA Lbr Len/2nd Weight Sex Type Anes PTL Lv  5 Term 03/06/13 105w5d  8 lb 0.6 oz (3.645 kg) M CS-LTranv Spinal  LIV  4 SAB 06/07/10          3 IAB 2008          2 Term 2007 [redacted]w[redacted]d 12:00 7 lb 9 oz (3.43 kg) F Vag-Spont EPI  LIV  1 IAB               01/14/2024   12:11 PM 03/21/2023   12:19 PM 11/15/2022   12:01 PM  Depression screen PHQ 2/9  Decreased Interest 3 1 2   Down, Depressed, Hopeless 3 1 1   PHQ - 2 Score 6 2 3   Altered sleeping 3 3 1   Tired, decreased energy 2 2 1   Change in appetite 3 0 0  Feeling bad or failure about yourself  0 0 0  Trouble concentrating 3 0 0  Moving slowly or fidgety/restless 0  0  Suicidal thoughts 0  0  PHQ-9 Score 17 7 5   Difficult doing work/chores Extremely dIfficult  Somewhat difficult     The following portions of the patient's history were reviewed and updated as appropriate: allergies, current medications, past family history, past medical history, past social history, past surgical history, and problem list.  Review of Systems Pertinent items noted in HPI and remainder of comprehensive ROS otherwise negative.   Past medical history, past surgical history, family history and social history were all reviewed and documented in the EPIC chart.   Exam:  There were no vitals filed for this visit.  There is no height or weight on file to calculate BMI.  General appearance:  Normal Thyroid :  Symmetrical, normal in size, without palpable masses or  nodularity. Respiratory  Auscultation:  Clear without wheezing or rhonchi Cardiovascular  Auscultation:  Regular rate, without rubs, murmurs or gallops  Edema/varicosities:  Not grossly evident Abdominal  Soft,nontender, without masses, guarding or rebound.  Liver/spleen:  No organomegaly noted  Hernia:  None appreciated  Skin  Inspection:  Grossly normal Breasts: Examined lying and sitting.   Right: Without masses, retractions, nipple discharge or axillary adenopathy.   Left: Without masses, retractions, nipple discharge or axillary adenopathy. Genitourinary   Inguinal/mons:  Normal without inguinal adenopathy  External genitalia:  Normal appearing vulva with no masses, tenderness, or lesions  BUS/Urethra/Skene's glands:  Normal without masses or exudate  Vagina:  Normal appearing with normal color and discharge, no lesions  Cervix:  Normal appearing without discharge or lesions  Uterus:  Normal in size, shape and contour.  Mobile, nontender  Adnexa/parametria:     Rt: Normal in size, without masses or tenderness.   Lt: Normal in size, without masses or tenderness.  Anus and perineum: Normal   [name], CMA present for exam  Assessment/Plan:    Return in 1 year for annual or as needed.   [name] B WHNP-BC 12:17  PM 01/15/2024

## 2024-02-12 ENCOUNTER — Other Ambulatory Visit: Payer: Self-pay | Admitting: Radiology

## 2024-02-12 DIAGNOSIS — Z30011 Encounter for initial prescription of contraceptive pills: Secondary | ICD-10-CM

## 2024-02-12 NOTE — Telephone Encounter (Signed)
 Patient overdue for AEX, message sent to FD to schedule. Medication refill pending until aex is scheduled.

## 2024-02-14 NOTE — Telephone Encounter (Signed)
 MyChart message and phone calls attempted to get patient scheduled for aex. Unsuccessful. RX Refused until pt makes appt  Med refill request:AUROVELA  Last AEX: 01/11/23 Next AEX: not scheduled  Last MMG (if hormonal med) Refill authorized: Last rx 08/31/23 #84 with 1 refill. Routing to provider for review

## 2024-04-08 ENCOUNTER — Ambulatory Visit (INDEPENDENT_AMBULATORY_CARE_PROVIDER_SITE_OTHER): Admitting: Nurse Practitioner

## 2024-04-08 ENCOUNTER — Encounter: Payer: Self-pay | Admitting: Nurse Practitioner

## 2024-04-08 ENCOUNTER — Encounter: Payer: Self-pay | Admitting: *Deleted

## 2024-04-08 VITALS — BP 100/78 | HR 62 | Temp 98.2°F | Ht 64.25 in | Wt 158.6 lb

## 2024-04-08 DIAGNOSIS — M25562 Pain in left knee: Secondary | ICD-10-CM | POA: Diagnosis not present

## 2024-04-08 DIAGNOSIS — Z0000 Encounter for general adult medical examination without abnormal findings: Secondary | ICD-10-CM | POA: Diagnosis not present

## 2024-04-08 DIAGNOSIS — G479 Sleep disorder, unspecified: Secondary | ICD-10-CM

## 2024-04-08 DIAGNOSIS — R7303 Prediabetes: Secondary | ICD-10-CM

## 2024-04-08 DIAGNOSIS — Z126 Encounter for screening for malignant neoplasm of bladder: Secondary | ICD-10-CM | POA: Diagnosis not present

## 2024-04-08 DIAGNOSIS — E663 Overweight: Secondary | ICD-10-CM

## 2024-04-08 DIAGNOSIS — E7800 Pure hypercholesterolemia, unspecified: Secondary | ICD-10-CM

## 2024-04-08 DIAGNOSIS — E559 Vitamin D deficiency, unspecified: Secondary | ICD-10-CM | POA: Insufficient documentation

## 2024-04-08 DIAGNOSIS — K589 Irritable bowel syndrome without diarrhea: Secondary | ICD-10-CM

## 2024-04-08 DIAGNOSIS — E538 Deficiency of other specified B group vitamins: Secondary | ICD-10-CM

## 2024-04-08 DIAGNOSIS — F4321 Adjustment disorder with depressed mood: Secondary | ICD-10-CM

## 2024-04-08 DIAGNOSIS — Z8249 Family history of ischemic heart disease and other diseases of the circulatory system: Secondary | ICD-10-CM

## 2024-04-08 DIAGNOSIS — Z23 Encounter for immunization: Secondary | ICD-10-CM

## 2024-04-08 DIAGNOSIS — G8929 Other chronic pain: Secondary | ICD-10-CM

## 2024-04-08 LAB — HEMOGLOBIN A1C: Hgb A1c MFr Bld: 5.7 % (ref 4.6–6.5)

## 2024-04-08 LAB — VITAMIN D 25 HYDROXY (VIT D DEFICIENCY, FRACTURES): VITD: 13.63 ng/mL — ABNORMAL LOW (ref 30.00–100.00)

## 2024-04-08 LAB — CBC WITH DIFFERENTIAL/PLATELET
Basophils Absolute: 0 K/uL (ref 0.0–0.1)
Basophils Relative: 0.7 % (ref 0.0–3.0)
Eosinophils Absolute: 0.2 K/uL (ref 0.0–0.7)
Eosinophils Relative: 3.8 % (ref 0.0–5.0)
HCT: 41.5 % (ref 36.0–46.0)
Hemoglobin: 13.5 g/dL (ref 12.0–15.0)
Lymphocytes Relative: 30.7 % (ref 12.0–46.0)
Lymphs Abs: 1.9 K/uL (ref 0.7–4.0)
MCHC: 32.5 g/dL (ref 30.0–36.0)
MCV: 87.3 fl (ref 78.0–100.0)
Monocytes Absolute: 0.4 K/uL (ref 0.1–1.0)
Monocytes Relative: 6.9 % (ref 3.0–12.0)
Neutro Abs: 3.5 K/uL (ref 1.4–7.7)
Neutrophils Relative %: 57.9 % (ref 43.0–77.0)
Platelets: 300 K/uL (ref 150.0–400.0)
RBC: 4.75 Mil/uL (ref 3.87–5.11)
RDW: 15.5 % (ref 11.5–15.5)
WBC: 6 K/uL (ref 4.0–10.5)

## 2024-04-08 LAB — COMPREHENSIVE METABOLIC PANEL WITH GFR
ALT: 10 U/L (ref 3–35)
AST: 13 U/L (ref 5–37)
Albumin: 4.3 g/dL (ref 3.5–5.2)
Alkaline Phosphatase: 53 U/L (ref 39–117)
BUN: 8 mg/dL (ref 6–23)
CO2: 30 meq/L (ref 19–32)
Calcium: 8.9 mg/dL (ref 8.4–10.5)
Chloride: 102 meq/L (ref 96–112)
Creatinine, Ser: 0.8 mg/dL (ref 0.40–1.20)
GFR: 94.6 mL/min (ref >60.00)
Glucose, Bld: 91 mg/dL (ref 70–99)
Potassium: 3.9 meq/L (ref 3.5–5.1)
Sodium: 138 meq/L (ref 135–145)
Total Bilirubin: 0.6 mg/dL (ref 0.2–1.2)
Total Protein: 6.6 g/dL (ref 6.0–8.3)

## 2024-04-08 LAB — LIPID PANEL
Cholesterol: 187 mg/dL (ref 28–200)
HDL: 51.5 mg/dL (ref >39.00)
LDL Cholesterol: 119 mg/dL — ABNORMAL HIGH (ref 10–99)
NonHDL: 135.85
Total CHOL/HDL Ratio: 4
Triglycerides: 83 mg/dL (ref 10.0–149.0)
VLDL: 16.6 mg/dL (ref 0.0–40.0)

## 2024-04-08 LAB — URINALYSIS, MICROSCOPIC ONLY
RBC / HPF: NONE SEEN (ref 0–?)
WBC, UA: NONE SEEN (ref 0–?)

## 2024-04-08 LAB — TSH: TSH: 0.7 u[IU]/mL (ref 0.35–5.50)

## 2024-04-08 LAB — VITAMIN B12: Vitamin B-12: 334 pg/mL (ref 211–911)

## 2024-04-08 MED ORDER — SERTRALINE HCL 50 MG PO TABS: ORAL_TABLET | ORAL | 0 refills | Status: DC

## 2024-04-08 MED ORDER — NAPROXEN 500 MG PO TABS: 500.0000 mg | ORAL_TABLET | Freq: Two times a day (BID) | ORAL | 1 refills | Status: AC

## 2024-04-08 MED ORDER — TRAZODONE HCL 50 MG PO TABS: 50.0000 mg | ORAL_TABLET | Freq: Every evening | ORAL | 5 refills | Status: AC | PRN

## 2024-04-08 NOTE — Telephone Encounter (Signed)
 Form has been printed out and will be faxed.

## 2024-04-08 NOTE — Assessment & Plan Note (Signed)
History of the same pending A1c

## 2024-04-08 NOTE — Assessment & Plan Note (Signed)
 History of same.  Is followed through [facility].  Also managed through dietary modifications.  Continue.

## 2024-04-08 NOTE — Assessment & Plan Note (Signed)
 Discussed age-appropriate immunizations and screening exams.  Did review patient's personal, surgical, social, family histories.  Patient is up-to-date on all age-appropriate vaccinations she would like.  Update tetanus vaccine today.  Patient is too young for CRC screening.  Too young for breast cancer screening.  Patient is followed by GYN and up-to-date on cervical cancer screening.  Patient was given information at discharge about preventative healthcare maintenance with anticipatory guidance

## 2024-04-08 NOTE — Assessment & Plan Note (Signed)
 Strong family history of mother and grandfather with cerebral aneurysms.  Patient has never been screened MRA placed today

## 2024-04-08 NOTE — Assessment & Plan Note (Signed)
 Patient request refill on naproxen  refill provided today

## 2024-04-08 NOTE — Assessment & Plan Note (Signed)
 History of the same pending vitamin B12

## 2024-04-08 NOTE — Assessment & Plan Note (Signed)
 History of the same patient has lost weight as of late pending lipid panel today

## 2024-04-08 NOTE — Progress Notes (Signed)
 Established Patient Office Visit  Subjective   Patient ID: [name], female    DOB: [birth year]  Age: 38 y.o. MRN: [id number]  Chief Complaint  Patient presents with   Annual Exam    Tdap vaccine    Medication Refill    Vitamin D  and Trazadone and naproxen      HPI  Sleep disturbance: Patient currently maintained on trazodone  50 mg nightly as needed. States that she has felt benefit. States when she has the medications she will go to bed around 10 and will get up around 645. States that she will take around 9pm. She was having trouble with getting and staying alseep. She is able to get to sleep now  IBS: Currently maintained on dietary modifications only. She has been seen bu GI in the past. States that she is using IBS relia.  [facility]  [place]   for complete physical and follow up of chronic conditions.  Immunizations: -Tetanus: Completed in 2014, update today  -Influenza: refused  -Shingles: Too young, currently average risk -Pneumonia: Too young, currently average risk - HBV: up to date   Diet: Fair diet. She is eaitng 1 time a day and sometimes she will sanck. She drinks a lot of water  Exercise: No regular exercise.  Eye exam: Completes annually.  Dental exam: Completes semi-annually    Colonoscopy: Too young, currently average risk Lung Cancer Screening: N/A  Pap smear: 01/11/2023 with ASCUS followed by GYN  Mammogram: Too young, currently average risk  DEXA: Too young      Review of Systems  Constitutional:  Negative for chills and fever.  Respiratory:  Negative for shortness of breath.   Cardiovascular:  Negative for chest pain and leg swelling.  Gastrointestinal:  Negative for abdominal pain, blood in stool, constipation, diarrhea, nausea and vomiting.       BM daily   Genitourinary:  Negative for dysuria and hematuria.  Neurological:  Negative for tingling and headaches.  Psychiatric/Behavioral:  Negative for hallucinations and suicidal  ideas.       Objective:     BP 100/78   Pulse 62   Temp 98.2 F (36.8 C) (Oral)   Ht 5' 4.25 (1.632 m)   Wt 158 lb 9.6 oz (71.9 kg)   LMP 04/06/2024 (Exact Date) Comment: currently on cycle 04/08/24  SpO2 97%   BMI 27.01 kg/m  BP Readings from Last 3 Encounters:  04/08/24 100/78  01/14/24 122/76  10/31/23 119/72   Wt Readings from Last 3 Encounters:  04/08/24 158 lb 9.6 oz (71.9 kg)  01/14/24 159 lb (72.1 kg)  08/31/23 172 lb (78 kg)   SpO2 Readings from Last 3 Encounters:  04/08/24 97%  01/14/24 100%  10/31/23 97%      Physical Exam Vitals and nursing note reviewed.  Constitutional:      Appearance: Normal appearance.  HENT:     Right Ear: Tympanic membrane, ear canal and external ear normal.     Left Ear: Tympanic membrane, ear canal and external ear normal.     Mouth/Throat:     Mouth: Mucous membranes are moist.     Pharynx: Oropharynx is clear.  Eyes:     Extraocular Movements: Extraocular movements intact.     Pupils: Pupils are equal, round, and reactive to light.  Cardiovascular:     Rate and Rhythm: Normal rate and regular rhythm.     Pulses: Normal pulses.     Heart sounds: Normal heart sounds.  Pulmonary:  Effort: Pulmonary effort is normal.     Breath sounds: Normal breath sounds.  Abdominal:     General: Bowel sounds are normal. There is no distension.     Palpations: There is no mass.     Tenderness: There is no abdominal tenderness.     Hernia: No hernia is present.  Musculoskeletal:     Right lower leg: No edema.     Left lower leg: No edema.  Lymphadenopathy:     Cervical: No cervical adenopathy.  Skin:    General: Skin is warm.  Neurological:     General: No focal deficit present.     Mental Status: She is alert.     Deep Tendon Reflexes:     Reflex Scores:      Bicep reflexes are 2+ on the right side and 2+ on the left side.      Patellar reflexes are 2+ on the right side and 2+ on the left side.    Comments: Bilateral  upper and lower extremity strength 5/5  Psychiatric:        Mood and Affect: Affect is tearful.        Behavior: Behavior normal.        Thought Content: Thought content normal.        Judgment: Judgment normal.      Results for orders placed or performed in visit on 04/08/24  Urine Microscopic  Result Value Ref Range   WBC, UA none seen 0-2/hpf   RBC / HPF none seen 0-2/hpf   Mucus, UA Presence of (A) None   Squamous Epithelial / HPF Rare(0-4/hpf) Rare(0-4/hpf)      The ASCVD Risk score (Arnett DK, et al., 2019) failed to calculate for the following reasons:   The 2019 ASCVD risk score is only valid for ages 18 to 45    Assessment & Plan:   Problem List Items Addressed This Visit       Digestive   IBS (irritable colon syndrome)   History of same.  Is followed through [facility].  Also managed through dietary modifications.  Continue.        Other   Preventative health care - Primary   Discussed age-appropriate immunizations and screening exams.  Did review patient's personal, surgical, social, family histories.  Patient is up-to-date on all age-appropriate vaccinations she would like.  Update tetanus vaccine today.  Patient is too young for CRC screening.  Too young for breast cancer screening.  Patient is followed by GYN and up-to-date on cervical cancer screening.  Patient was given information at discharge about preventative healthcare maintenance with anticipatory guidance      Relevant Orders   Comprehensive metabolic panel with GFR   CBC with Differential/Platelet   TSH   Overweight   History of the same patient has lost weight over the past several months.  Pending TSH, A1c, lipid panel.      Relevant Orders   Hemoglobin A1c   Lipid panel   Family history of cerebral aneurysm   Strong family history of mother and grandfather with cerebral aneurysms.  Patient has never been screened MRA placed today      Relevant Orders   MR ANGIO HEAD WO CONTRAST    Chronic pain of left knee   Patient request refill on naproxen  refill provided today      Relevant Medications   sertraline  (ZOLOFT ) 50 MG tablet   traZODone  (DESYREL ) 50 MG tablet   naproxen  (NAPROSYN ) 500 MG tablet  Sleep disturbance   History of same recent lost her boyfriend to a traumatic event.  Currently on trazodone  50 mg nightly as needed.  Seems to be effective refill provided today      Relevant Medications   traZODone  (DESYREL ) 50 MG tablet   Other Relevant Orders   TSH   Adjustment disorder with depressed mood   Will start patient on sertraline  25 mg for 10 days then increase to 50 mg thereafter.  Patient denies HI/SI/AVH.      Relevant Medications   sertraline  (ZOLOFT ) 50 MG tablet   Prediabetes   History of the same pending A1c      Relevant Orders   Hemoglobin A1c   Lipid panel   Elevated LDL cholesterol level   History of the same patient has lost weight as of late pending lipid panel today      Relevant Orders   Hemoglobin A1c   Lipid panel   Vitamin B12 deficiency   History of the same pending vitamin B12      Relevant Orders   Vitamin B12   Vitamin D  deficiency   History of same pending vitamin D       Relevant Orders   VITAMIN D  25 Hydroxy (Vit-D Deficiency, Fractures)   Other Visit Diagnoses       Screening for bladder cancer       Relevant Orders   Urine Microscopic (Completed)     Need for Tdap vaccination       Relevant Orders   Tdap vaccine greater than or equal to 7yo IM (Completed)       Return in about 6 weeks (around 05/20/2024) for Mood.    Adina Crandall, NP

## 2024-04-08 NOTE — Assessment & Plan Note (Signed)
 History of same pending vitamin D

## 2024-04-08 NOTE — Assessment & Plan Note (Signed)
 Will start patient on sertraline  25 mg for 10 days then increase to 50 mg thereafter.  Patient denies HI/SI/AVH.

## 2024-04-08 NOTE — Assessment & Plan Note (Signed)
 History of the same patient has lost weight over the past several months.  Pending TSH, A1c, lipid panel.

## 2024-04-08 NOTE — Assessment & Plan Note (Signed)
 History of same recent lost her boyfriend to a traumatic event.  Currently on trazodone  50 mg nightly as needed.  Seems to be effective refill provided today

## 2024-04-08 NOTE — Patient Instructions (Signed)
Nice to see you today I will be in touch with the labs once I have them Follow up with me in 6 weeks, sooner if you need me 

## 2024-04-10 ENCOUNTER — Ambulatory Visit: Payer: Self-pay | Admitting: Nurse Practitioner

## 2024-04-10 ENCOUNTER — Telehealth: Payer: Self-pay

## 2024-04-10 DIAGNOSIS — E559 Vitamin D deficiency, unspecified: Secondary | ICD-10-CM

## 2024-04-10 DIAGNOSIS — K529 Noninfective gastroenteritis and colitis, unspecified: Secondary | ICD-10-CM

## 2024-04-10 DIAGNOSIS — F4321 Adjustment disorder with depressed mood: Secondary | ICD-10-CM

## 2024-04-10 MED ORDER — VITAMIN D (ERGOCALCIFEROL) 1.25 MG (50000 UNIT) PO CAPS: 50000.0000 [IU] | ORAL_CAPSULE | ORAL | 0 refills | Status: AC

## 2024-04-10 NOTE — Telephone Encounter (Signed)
 Faxed physical form for patient.

## 2024-04-15 NOTE — Telephone Encounter (Signed)
 Do you want us  to call patient for evaluation?

## 2024-04-18 MED ORDER — ONDANSETRON 4 MG PO TBDP: 4.0000 mg | ORAL_TABLET | Freq: Three times a day (TID) | ORAL | 0 refills | Status: AC | PRN

## 2024-04-18 NOTE — Addendum Note (Signed)
 Addended by: WENDEE LYNWOOD HERO on: 04/18/2024 07:41 AM   Modules accepted: Orders

## 2024-05-06 ENCOUNTER — Other Ambulatory Visit: Payer: Self-pay | Admitting: Nurse Practitioner

## 2024-05-06 DIAGNOSIS — F4321 Adjustment disorder with depressed mood: Secondary | ICD-10-CM

## 2024-05-13 ENCOUNTER — Emergency Department (HOSPITAL_BASED_OUTPATIENT_CLINIC_OR_DEPARTMENT_OTHER)
Admission: EM | Admit: 2024-05-13 | Discharge: 2024-05-13 | Disposition: A | Discharge status: Home / Self Care | Attending: Emergency Medicine | Admitting: Emergency Medicine

## 2024-05-13 ENCOUNTER — Emergency Department (HOSPITAL_BASED_OUTPATIENT_CLINIC_OR_DEPARTMENT_OTHER)

## 2024-05-13 ENCOUNTER — Other Ambulatory Visit: Payer: Self-pay

## 2024-05-13 ENCOUNTER — Encounter (HOSPITAL_BASED_OUTPATIENT_CLINIC_OR_DEPARTMENT_OTHER): Payer: Self-pay

## 2024-05-13 DIAGNOSIS — R1084 Generalized abdominal pain: Secondary | ICD-10-CM | POA: Diagnosis not present

## 2024-05-13 DIAGNOSIS — R109 Unspecified abdominal pain: Secondary | ICD-10-CM | POA: Diagnosis present

## 2024-05-13 DIAGNOSIS — R197 Diarrhea, unspecified: Secondary | ICD-10-CM | POA: Diagnosis not present

## 2024-05-13 DIAGNOSIS — R112 Nausea with vomiting, unspecified: Secondary | ICD-10-CM | POA: Insufficient documentation

## 2024-05-13 DIAGNOSIS — Z5329 Procedure and treatment not carried out because of patient's decision for other reasons: Secondary | ICD-10-CM | POA: Insufficient documentation

## 2024-05-13 LAB — COMPREHENSIVE METABOLIC PANEL WITH GFR
ALT: 12 U/L (ref 0–44)
AST: 17 U/L (ref 15–41)
Albumin: 4.7 g/dL (ref 3.5–5.0)
Alkaline Phosphatase: 66 U/L (ref 38–126)
Anion gap: 11 (ref 5–15)
BUN: 10 mg/dL (ref 6–20)
CO2: 28 mmol/L (ref 22–32)
Calcium: 9.7 mg/dL (ref 8.9–10.3)
Chloride: 102 mmol/L (ref 98–111)
Creatinine, Ser: 0.78 mg/dL (ref 0.44–1.00)
GFR, Estimated: >60 mL/min
Glucose, Bld: 98 mg/dL (ref 70–99)
Potassium: 3.7 mmol/L (ref 3.5–5.1)
Sodium: 140 mmol/L (ref 135–145)
Total Bilirubin: 0.4 mg/dL (ref 0.0–1.2)
Total Protein: 7.5 g/dL (ref 6.5–8.1)

## 2024-05-13 LAB — URINALYSIS, ROUTINE W REFLEX MICROSCOPIC
Bilirubin Urine: NEGATIVE
Glucose, UA: NEGATIVE mg/dL
Hgb urine dipstick: NEGATIVE
Ketones, ur: NEGATIVE mg/dL
Leukocytes,Ua: NEGATIVE
Nitrite: NEGATIVE
Specific Gravity, Urine: 1.028 (ref 1.005–1.030)
pH: 7 (ref 5.0–8.0)

## 2024-05-13 LAB — CBC
HCT: 42.9 % (ref 36.0–46.0)
Hemoglobin: 13.9 g/dL (ref 12.0–15.0)
MCH: 27.9 pg (ref 26.0–34.0)
MCHC: 32.4 g/dL (ref 30.0–36.0)
MCV: 86 fL (ref 80.0–100.0)
Platelets: 305 K/uL (ref 150–400)
RBC: 4.99 MIL/uL (ref 3.87–5.11)
RDW: 14.5 % (ref 11.5–15.5)
WBC: 7.8 K/uL (ref 4.0–10.5)
nRBC: 0 % (ref 0.0–0.2)

## 2024-05-13 LAB — PREGNANCY, URINE: Preg Test, Ur: NEGATIVE

## 2024-05-13 LAB — LIPASE, BLOOD: Lipase: 22 U/L (ref 11–51)

## 2024-05-13 LAB — OCCULT BLOOD X 1 CARD TO LAB, STOOL: Fecal Occult Bld: POSITIVE — AB

## 2024-05-13 MED ORDER — IOHEXOL 300 MG/ML  SOLN
100.0000 mL | Freq: Once | INTRAMUSCULAR | Status: AC | PRN
  Administered 2024-05-13: 100 mL via INTRAVENOUS

## 2024-05-13 MED ORDER — ONDANSETRON HCL 4 MG/2ML IJ SOLN
4.0000 mg | Freq: Once | INTRAMUSCULAR | Status: AC
  Administered 2024-05-13: 4 mg via INTRAVENOUS
  Filled 2024-05-13: qty 2

## 2024-05-13 NOTE — ED Triage Notes (Signed)
 Pt caox4 ambulatory c/o abd pain since last night and noticed blood in the stool and vomit. Pt further states this has happened in the past with dx of diverticulitis.

## 2024-05-13 NOTE — ED Provider Notes (Signed)
 " [place] [facility] AT [facility] Provider Note   CSN: [id number] Arrival date & time: 05/13/24  1005     Patient presents with: Abdominal Pain   [name] is a 39 y.o. female.    Abdominal Pain Associated symptoms: diarrhea, nausea and vomiting   39 year old female presenting with abdominal pain and blood in her vomit and stool.  Patient reports that this started last night.  She reports she has had something like this happen before.  At that time she was diagnosed with diverticulitis.  Patient also reports that she did not have any vomiting last time.  She denies any other symptoms.     Prior to Admission medications  Medication Sig Start Date End Date Taking? Authorizing Provider  naproxen  (NAPROSYN ) 500 MG tablet Take 1 tablet (500 mg total) by mouth 2 (two) times daily with a meal. 04/08/24   Wendee Lynwood HERO, NP  Norethindrone  Acetate-Ethinyl Estrad-FE (JUNEL  FE 24) 1-20 MG-MCG(24) tablet Take 1 tablet by mouth daily. 08/31/23   Chrzanowski, Jami B, NP  ondansetron  (ZOFRAN -ODT) 4 MG disintegrating tablet Take 1 tablet (4 mg total) by mouth every 8 (eight) hours as needed. 04/18/24   Wendee Lynwood HERO, NP  sertraline  (ZOLOFT ) 50 MG tablet Take 1 tablet (50 mg total) by mouth daily. 05/07/24   Wendee Lynwood HERO, NP  traZODone  (DESYREL ) 50 MG tablet Take 1 tablet (50 mg total) by mouth at bedtime as needed for sleep. 04/08/24   Wendee Lynwood HERO, NP  Vitamin D , Ergocalciferol , (DRISDOL ) 1.25 MG (50000 UNIT) CAPS capsule Take 1 capsule (50,000 Units total) by mouth every 7 (seven) days. 04/10/24   Wendee Lynwood HERO, NP    Allergies: Banana and Flagyl  [metronidazole ]    Review of Systems  Gastrointestinal:  Positive for abdominal pain, blood in stool, diarrhea, nausea and vomiting.  All other systems reviewed and are negative.   Updated Vital Signs BP 124/78 (BP Location: Right Arm)   Pulse (!) 59   Temp 98.4 F (36.9 C)   Resp 16   Ht 5' 4 (1.626 m)   Wt  68.6 kg   LMP 05/07/2024 (Exact Date)   SpO2 99%   BMI 25.96 kg/m   Physical Exam Vitals and nursing note reviewed. Exam conducted with a chaperone present.  HENT:     Mouth/Throat:     Pharynx: Oropharynx is clear.  Cardiovascular:     Rate and Rhythm: Normal rate.     Pulses: Normal pulses.  Pulmonary:     Effort: Pulmonary effort is normal.     Breath sounds: Normal breath sounds.  Abdominal:     General: Abdomen is flat. Bowel sounds are normal.     Palpations: Abdomen is soft.     Tenderness: There is generalized abdominal tenderness. There is no guarding or rebound. Negative signs include Murphy's sign and McBurney's sign.  Genitourinary:    Comments: No rashes noted around the anus.  No hemorrhoids noted.  Good sphincter tone intact.  No blood or stool noted on glove after exam. Skin:    General: Skin is warm and dry.  Neurological:     General: No focal deficit present.     Mental Status: She is alert.     (all labs ordered are listed, but only abnormal results are displayed) Labs Reviewed  URINALYSIS, ROUTINE W REFLEX MICROSCOPIC - Abnormal; Notable for the following components:      Result Value   Protein, ur TRACE (*)  All other components within normal limits  LIPASE, BLOOD  COMPREHENSIVE METABOLIC PANEL WITH GFR  CBC  PREGNANCY, URINE    EKG: None  Radiology: No results found.   Procedures   Medications Ordered in the ED  ondansetron  (ZOFRAN ) injection 4 mg (has no administration in time range)    Clinical Course as of 05/13/24 1318  Tue May 13, 2024  1317 nRBC: 0.0 [EF]    Clinical Course User Index [EF] Rosaline Almarie MATSU, PA-C                                 Medical Decision Making Amount and/or Complexity of Data Reviewed Labs: ordered. Decision-making details documented in ED Course. Radiology: ordered.  Risk Prescription drug management.   Impression: 39 year old female presenting with abdominal pain with vomiting and  diarrhea.   Additional History: Patient was able provide history.  I also reviewed other outpatient notes.  Labs: [facility] showed no acute changes. CMP showed no acute changes.  Urinalysis showed no acute changes.  Imaging: CT was ordered and performed however the results were not read due to a IT issue.  ED Course/Meds: 39 year old female presenting with abdominal pain with vomiting and diarrhea.  Patient was well-appearing in no acute distress.  Patient reports that starting yesterday she was having some vomiting and some diarrhea which she noted had some blood in it.  Patient reports that she is still feeling a little bit nauseous.  So Zofran  was prescribed after receiving that she states she felt much better.  Due to the blood in both vomit and the stool and a CT of the abdomen and pelvis was ordered.  Patient reports that the stool was a more red and in the bowl not in the stool.  Patient reports that she is starting to feel much better.  On rectal exam there were no signs of any hemorrhoids.  I did not feel any impacted stools and was unable to get much stool on my finger for the occult blood test.  Due to a IT malfunction the CT images were not being able to be read.  We did not know when they would be able to be read.  I talked to the patient about this and she states that she did not want to wait for the results.  I spoke with her the importance of waiting for the test results.  However she states she still did not want to wait for it.  I spoke to her how her labs were normal but we did not know what was going on and the CT would give us  a little bit more.  She still did not want to wait.  Educated on the risk and benefits of waiting including and up to death.  She verbally agrees that she understands this and reports if her symptoms start to get worse she will return to the ER.  I told her that she needed to follow-up with her GI doctor and family medicine doctor.  Patient remained stable while in the  ER.  Patient left AMA.      Final diagnoses:  None    ED Discharge Orders     None          Rosaline Almarie MATSU, [place] 05/13/24 1713  "

## 2024-05-13 NOTE — Discharge Instructions (Addendum)
 I recommend that you follow-up with your GI doctor or your family medicine doctor.  I also recommended that you stay in the ER however you have decided that you you do not want to stay for the results.  I recommend that if your pain gets any worse she return to the ER.

## 2024-05-13 NOTE — ED Notes (Signed)
 Patient transported to CT

## 2024-05-14 MED ORDER — AZITHROMYCIN 500 MG PO TABS: 500.0000 mg | ORAL_TABLET | Freq: Every day | ORAL | 0 refills | Status: AC

## 2024-05-14 MED ORDER — BUPROPION HCL ER (XL) 150 MG PO TB24: 150.0000 mg | ORAL_TABLET | Freq: Every day | ORAL | 0 refills | Status: AC

## 2024-05-14 NOTE — Addendum Note (Signed)
 Addended by: WENDEE LYNWOOD HERO on: 05/14/2024 01:33 PM   Modules accepted: Orders

## 2024-05-29 ENCOUNTER — Ambulatory Visit: Admitting: Nurse Practitioner

## 2024-06-05 ENCOUNTER — Encounter: Admitting: Nurse Practitioner

## 2024-07-03 ENCOUNTER — Ambulatory Visit: Admitting: Radiology

## 2024-07-10 ENCOUNTER — Ambulatory Visit: Admitting: Nurse Practitioner
